# Patient Record
Sex: Female | Born: 1945 | Race: White | Hispanic: Yes | Marital: Married | Smoking: Never smoker
Health system: Southern US, Community
[De-identification: ages and names within clinical notes are randomized; demographics above are authoritative.]

## PROBLEM LIST (undated history)

## (undated) DIAGNOSIS — N289 Disorder of kidney and ureter, unspecified: Secondary | ICD-10-CM

## (undated) DIAGNOSIS — E7800 Pure hypercholesterolemia, unspecified: Secondary | ICD-10-CM

## (undated) DIAGNOSIS — I10 Essential (primary) hypertension: Secondary | ICD-10-CM

## (undated) DIAGNOSIS — E119 Type 2 diabetes mellitus without complications: Secondary | ICD-10-CM

## (undated) HISTORY — PX: TUBAL LIGATION: SHX77

---

## 2003-10-30 ENCOUNTER — Other Ambulatory Visit: Payer: Self-pay

## 2006-12-09 ENCOUNTER — Other Ambulatory Visit: Payer: Self-pay

## 2006-12-09 ENCOUNTER — Emergency Department: Payer: Self-pay | Admitting: Unknown Physician Specialty

## 2006-12-10 ENCOUNTER — Ambulatory Visit: Payer: Self-pay | Admitting: Unknown Physician Specialty

## 2006-12-23 ENCOUNTER — Ambulatory Visit: Payer: Self-pay | Admitting: Family Medicine

## 2009-12-13 ENCOUNTER — Emergency Department (HOSPITAL_COMMUNITY): Admission: EM | Admit: 2009-12-13 | Discharge: 2009-12-13 | Payer: Self-pay | Admitting: Emergency Medicine

## 2011-01-02 ENCOUNTER — Emergency Department (HOSPITAL_COMMUNITY)
Admission: EM | Admit: 2011-01-02 | Discharge: 2011-01-03 | Disposition: A | Discharge status: Home / Self Care | Payer: Self-pay | Attending: Emergency Medicine | Admitting: Emergency Medicine

## 2011-01-02 DIAGNOSIS — R112 Nausea with vomiting, unspecified: Secondary | ICD-10-CM | POA: Insufficient documentation

## 2011-01-02 DIAGNOSIS — I10 Essential (primary) hypertension: Secondary | ICD-10-CM | POA: Insufficient documentation

## 2011-01-02 DIAGNOSIS — E1169 Type 2 diabetes mellitus with other specified complication: Secondary | ICD-10-CM | POA: Insufficient documentation

## 2011-01-02 LAB — CBC
Hemoglobin: 14.6 g/dL (ref 12.0–15.0)
MCHC: 36.7 g/dL — ABNORMAL HIGH (ref 30.0–36.0)
MCV: 83.8 fL (ref 78.0–100.0)
RBC: 4.75 MIL/uL (ref 3.87–5.11)
RDW: 12.2 % (ref 11.5–15.5)

## 2011-01-02 LAB — BASIC METABOLIC PANEL
CO2: 25 mEq/L (ref 19–32)
Calcium: 9.3 mg/dL (ref 8.4–10.5)
Glucose, Bld: 356 mg/dL — ABNORMAL HIGH (ref 70–99)
Potassium: 2.8 mEq/L — ABNORMAL LOW (ref 3.5–5.1)

## 2011-01-02 LAB — DIFFERENTIAL
Basophils Absolute: 0.1 10*3/uL (ref 0.0–0.1)
Basophils Relative: 1 % (ref 0–1)
Eosinophils Relative: 3 % (ref 0–5)
Lymphocytes Relative: 46 % (ref 12–46)
Neutro Abs: 2.6 10*3/uL (ref 1.7–7.7)
Neutrophils Relative %: 42 % — ABNORMAL LOW (ref 43–77)

## 2011-01-03 LAB — GLUCOSE, CAPILLARY: Glucose-Capillary: 307 mg/dL — ABNORMAL HIGH (ref 70–99)

## 2011-01-03 LAB — URINALYSIS, ROUTINE W REFLEX MICROSCOPIC
Glucose, UA: >1000 mg/dL — AB
Ketones, ur: 15 mg/dL — AB
Nitrite: NEGATIVE
Specific Gravity, Urine: 1.034 — ABNORMAL HIGH (ref 1.005–1.030)

## 2011-01-03 LAB — POCT I-STAT, CHEM 8
Glucose, Bld: 314 mg/dL — ABNORMAL HIGH (ref 70–99)
HCT: 39 % (ref 36.0–46.0)
Hemoglobin: 13.3 g/dL (ref 12.0–15.0)
TCO2: 23 mmol/L (ref 0–100)

## 2011-01-03 LAB — URINE MICROSCOPIC-ADD ON

## 2011-01-19 ENCOUNTER — Emergency Department (HOSPITAL_COMMUNITY): Payer: Self-pay

## 2011-01-19 ENCOUNTER — Encounter (HOSPITAL_COMMUNITY): Payer: Self-pay | Admitting: Radiology

## 2011-01-19 ENCOUNTER — Emergency Department (HOSPITAL_COMMUNITY)
Admission: EM | Admit: 2011-01-19 | Discharge: 2011-01-19 | Disposition: A | Discharge status: Home / Self Care | Payer: Self-pay | Attending: Emergency Medicine | Admitting: Emergency Medicine

## 2011-01-19 DIAGNOSIS — R112 Nausea with vomiting, unspecified: Secondary | ICD-10-CM | POA: Insufficient documentation

## 2011-01-19 DIAGNOSIS — F3289 Other specified depressive episodes: Secondary | ICD-10-CM | POA: Insufficient documentation

## 2011-01-19 DIAGNOSIS — E119 Type 2 diabetes mellitus without complications: Secondary | ICD-10-CM | POA: Insufficient documentation

## 2011-01-19 DIAGNOSIS — J329 Chronic sinusitis, unspecified: Secondary | ICD-10-CM | POA: Insufficient documentation

## 2011-01-19 DIAGNOSIS — R0602 Shortness of breath: Secondary | ICD-10-CM | POA: Insufficient documentation

## 2011-01-19 DIAGNOSIS — F329 Major depressive disorder, single episode, unspecified: Secondary | ICD-10-CM | POA: Insufficient documentation

## 2011-01-19 DIAGNOSIS — Z79899 Other long term (current) drug therapy: Secondary | ICD-10-CM | POA: Insufficient documentation

## 2011-01-19 DIAGNOSIS — R079 Chest pain, unspecified: Secondary | ICD-10-CM | POA: Insufficient documentation

## 2011-01-19 DIAGNOSIS — R42 Dizziness and giddiness: Secondary | ICD-10-CM | POA: Insufficient documentation

## 2011-01-19 DIAGNOSIS — R51 Headache: Secondary | ICD-10-CM | POA: Insufficient documentation

## 2011-01-19 DIAGNOSIS — I10 Essential (primary) hypertension: Secondary | ICD-10-CM | POA: Insufficient documentation

## 2011-01-19 HISTORY — DX: Essential (primary) hypertension: I10

## 2011-01-19 LAB — DIFFERENTIAL
Basophils Relative: 1 % (ref 0–1)
Eosinophils Absolute: 0.1 10*3/uL (ref 0.0–0.7)
Lymphocytes Relative: 47 % — ABNORMAL HIGH (ref 12–46)
Lymphs Abs: 2.8 10*3/uL (ref 0.7–4.0)
Monocytes Absolute: 0.4 10*3/uL (ref 0.1–1.0)
Neutro Abs: 2.7 10*3/uL (ref 1.7–7.7)
Neutrophils Relative %: 44 % (ref 43–77)

## 2011-01-19 LAB — URINALYSIS, ROUTINE W REFLEX MICROSCOPIC
Bilirubin Urine: NEGATIVE
Glucose, UA: >1000 mg/dL — AB
Hgb urine dipstick: NEGATIVE
Protein, ur: NEGATIVE mg/dL
Specific Gravity, Urine: 1.028 (ref 1.005–1.030)
Urobilinogen, UA: 0.2 mg/dL (ref 0.0–1.0)

## 2011-01-19 LAB — CK TOTAL AND CKMB (NOT AT ARMC)
Relative Index: INVALID (ref 0.0–2.5)
Total CK: 60 U/L (ref 7–177)
Total CK: 50 U/L (ref 7–177)

## 2011-01-19 LAB — CBC
HCT: 39 % (ref 36.0–46.0)
Hemoglobin: 14 g/dL (ref 12.0–15.0)
MCH: 29.9 pg (ref 26.0–34.0)
Platelets: 306 10*3/uL (ref 150–400)
RBC: 4.69 MIL/uL (ref 3.87–5.11)
WBC: 6.1 10*3/uL (ref 4.0–10.5)

## 2011-01-19 LAB — POCT I-STAT, CHEM 8
Creatinine, Ser: 0.3 mg/dL — ABNORMAL LOW (ref 0.50–1.10)
Glucose, Bld: 337 mg/dL — ABNORMAL HIGH (ref 70–99)
HCT: 42 % (ref 36.0–46.0)

## 2011-01-20 LAB — URINE CULTURE: Colony Count: 5000

## 2013-06-30 ENCOUNTER — Ambulatory Visit: Payer: Self-pay | Admitting: Family Medicine

## 2013-06-30 VITALS — BP 120/80 | HR 99 | Temp 98.0°F | Resp 16 | Ht <= 58 in | Wt 97.0 lb

## 2013-06-30 DIAGNOSIS — E119 Type 2 diabetes mellitus without complications: Secondary | ICD-10-CM | POA: Insufficient documentation

## 2013-06-30 DIAGNOSIS — R42 Dizziness and giddiness: Secondary | ICD-10-CM

## 2013-06-30 DIAGNOSIS — M542 Cervicalgia: Secondary | ICD-10-CM

## 2013-06-30 DIAGNOSIS — IMO0001 Reserved for inherently not codable concepts without codable children: Secondary | ICD-10-CM

## 2013-06-30 LAB — POCT GLYCOSYLATED HEMOGLOBIN (HGB A1C): Hemoglobin A1C: 11

## 2013-06-30 MED ORDER — GLIMEPIRIDE 2 MG PO TABS: 2.0000 mg | ORAL_TABLET | Freq: Every day | ORAL | Status: DC

## 2013-06-30 MED ORDER — MECLIZINE HCL 12.5 MG PO TABS: ORAL_TABLET | ORAL | Status: DC

## 2013-06-30 NOTE — Progress Notes (Signed)
Subjective: 67 year old lady who apparently fell about a month ago. She saw a doctor at some clinic on [address] She had strained her neck. Since then she has had a good deal or dizziness at times. She also has crepitance in her neck. She was found to be diabetic and was placed on metformin. She says it causes her nausea and vomiting. She does not speak any [place].  Fab helped interpret. No other major complaints. She apparently has been told somewhere along the line she also had hyperlipidemia.  Objective: Pleasant elderly Hispanic lady in no acute distress. Eyes PERRLA. EOMs intact. TMs normal. Throat clear. Neck which she says she cannot move she is constantly moving. No crepitance could be appreciated. Good range of motion. Chest clear. Heart regular without murmurs. Extremities normal.  Assessment: Dizziness, mild and mostly positional Diabetes History of hyperlipidemia Cervical pain secondary to fall  Plan: CBC and hemoglobin A1c  Results for orders placed in visit on 06/30/13  GLUCOSE, POCT (MANUAL RESULT ENTRY)      Result Value Range   POC Glucose 292 (*) 70 - 99 mg/dl  POCT GLYCOSYLATED HEMOGLOBIN (HGB A1C)      Result Value Range   Hemoglobin A1C 11.0     Dizziness probably related to the poorly controlled diabetes.  Continue the metformin  Add 2 mg of glyburide daily  Return in 3 weeks

## 2013-06-30 NOTE — Patient Instructions (Signed)
Diabetes Mellitus Tipo 2 - Adultos   (Type 2 Diabetes Mellitus, Adult)  La diabetes mellitus tipo 2, generalmente se denomina simplemente diabetes tipo 2, es una enfermedad de larga duración (crónica). En la diabetes tipo 2, el páncreas no produce suficiente insulina (una hormona), las células son menos sensibles a la insulina que se produce (resistencia a la insulina), o ambos. Normalmente, la insulina mueve los azúcares de los alimentos a las células de los tejidos. Las células de los tejidos utilizan los azúcares para obtener energía. La falta de insulina o la falta de una respuesta normal a la insulina hace que el exceso de azúcar se acumule en la sangre en lugar de penetrar en las células de los tejidos. Como resultado, se desarrollan niveles altos de azúcar en la sangre (hiperglucemia). El efecto de los niveles altos de azúcar (glucosa) puede causar muchas complicaciones.    La diabetes tipo 2 también se denomina diabetes del adulto, pero puede ocurrir a cualquier edad.      FACTORES DE RIESGO   Una persona está predispuesta a desarrollar diabetes tipo 2 si alguien en su familia tiene la enfermedad y también tiene uno o más de los siguientes factores de riesgo principales:   · Sobrepeso.  · Un estilo de vida inactivo.  · Una historia de consumo constante de alimentos de altas calorías.  Mantener un peso saludable y realizar actividad física regular puede reducir la probabilidad de desarrollar diabetes tipo 2.   SÍNTOMAS   Una persona con diabetes tipo 2 no presenta síntomas en un principio. Los síntomas de la diabetes tipo 2 aparecen lentamente. Los síntomas son:   · Aumento de la sed (polidipsia).  · Aumento de ganas de orinar (poliuria).  · Aumento de ganas de orinar durante la noche (nocturia).  · Pérdida de peso. La pérdida de peso puede ser muy rápida.  · Infecciones frecuentes y recurrentes.  · Cansancio (fatiga).  · Debilidad.  · Cambios en la visión, como visión borrosa.  · Olor a fruta en el  aliento.  · Dolor abdominal.  · Náuseas o vómitos.  · Cortes o moretones que tardan en curarse.  · Hormigueo o adormecimiento de las manos y los pies.  DIAGNÓSTICO   No se diagnostica con frecuencia la diabetes tipo 2 hasta que se presentan las complicaciones de la diabetes. La diabetes tipo 2 se diagnostica cuando se presentan los síntomas o las complicaciones y cuando se incrementan los niveles de glucosa en sangre. El nivel de glucosa en la sangre puede controlarse en uno o más de los siguientes análisis de sangre:   · Medición de glucosa de sangre en ayunas. No deberá comer durante al menos 8 horas antes de que se tome la muestra de sangre.  · Pruebas al azar de glucosa en sangre. El nivel de glucosa en sangre se controla en cualquier momento del día sin importar el momento en que haya comido.  · Pruebas de glucosa de sangre de hemoglobina A1c. Una prueba de la hemoglobina A1c proporciona información sobre el control de la glucosa en la sangre durante los últimos 3 meses.  · Prueba oral de tolerancia a la glucosa (SOG). La glucosa en la sangre se mide después de no haber comido (ayunas) durante 2 horas y después de beber una bebida que contenga glucosa.  TRATAMIENTO   · Usted puede necesitar administrarse insulina o medicamentos para la diabetes todos los días para mantener los niveles de glucosa en sangre en el rango deseado.  · Tendrá que regular   la dosis de insulina con el ejercicio y con la elección de alimentos saludables.  El objetivo del tratamiento es mantener el nivel de azúcar en sangre previo a comer (glucosa antes de las comidas) a 70-130 mg/dl.   INSTRUCCIONES PARA EL CUIDADO EN EL HOGAR   · Haga que su nivel de hemoglobina A1c sea verificado dos veces al año.  · Realice el monitoreo diario de glucosa en sangre según las indicaciones de su médico.  · Controle las cetonas en la orina cuando esté enfermo y según las indicaciones de su médico.  · Tome el medicamento para la diabetes o adminístrese  insulina según las indicaciones de su médico para mantener el nivel de glucosa en sangre en el rango deseado.  · Nunca se quede sin medicamentos para la diabetes o sin insulina. Es necesario recibirla todos los días.  · Ajuste la insulina sobre la base de la ingesta de hidratos de carbono. Los hidratos de carbono pueden aumentar los niveles de glucosa en la sangre, pero deben incluirse en su dieta. Los hidratos de carbono aportan vitaminas, minerales y fibra que son una parte esencial de una dieta saludable. Los hidratos de carbono se encuentran en frutas, verduras, granos enteros, productos lácteos, legumbres y alimentos que contienen azúcares añadidos.  ·   · Consuma alimentos saludables. Alterne 3 comidas con 3 colaciones.  · Baje de peso, si tiene sobrepeso.  · Lleve una tarjeta de alerta médica o use una pulsera o medalla de alerta médica.  · Lleve con usted una colación de 15 gramos de hidrato de carbono en todo momento para controlar los niveles bajos de glucosa en sangre (hipoglucemia). Algunos ejemplos de colaciones de 15 gramos de hidratos de carbono son:  · Tabletas de glucosa, 3 o 4.  ·   Gel de glucosa, tubo de 15 gramos.  · Pasas, 2 cucharadas (24 gramos).  · Caramelos de goma, 6.  · Galletas de animales, 8.  · Gaseosa, 4 onzas (120 mililitros).  · Pastillas de goma, 9.  · Reconocer la hipoglucemia. La hipoglucemia se produce cuando los niveles de glucosa en sangre son de 70 mg/dl o menos. El riesgo de hipoglucemia aumenta durante el ayuno o al saltearse las comidas, durante o después del ejercicio intenso, y durante el sueño. Los síntomas de hipoglucemia son:  · Temblores o sacudidas.  · Disminución de capacidad de concentración.  · Sudoración.  · Aumento en el ritmo cardíaco.  · Dolor de cabeza.  · Boca seca.  · Hambre.  · Irritabilidad.  · Ansiedad.  · Sueño agitado.  · Alteración del habla o de la coordinación.  · Confusión.  · Tratar la hipoglucemia rápidamente. Si usted está alerta y puede tragar  con seguridad, siga la regla de 15:15:   · Tome de 15 a 20 gramos de glucosa de acción rápida o hidratos de carbono. Las opciones de acción rápida son un gel de glucosa, unas tabletas de glucosa, o 4 onzas (120 ml) de jugo de frutas, soda regular, o leche baja en grasa.  · Compruebe su nivel de glucosa en sangre 15 minutos después de tomar la glucosa.  · Tome de 15 a 20 gramos más de glucosa si al repetir el nivel de glucosa en la sangre todavía es de 70 mg / dl o inferior.  · Consuma una comida o una colación dentro de 1 hora una vez que los niveles de glucosa en la sangre vuelven a la normalidad.  ·   · Esté alerta   o como lo indique su mdico.  Ajuste su medicamento y la ingesta de alimentos, segn sea necesario, si inicia un nuevo ejercicio o deporte.  Siga su plan diario de enfermo en algn momento que no puede comer o beber como de costumbre.  Evite el consumo de tabaco.  Limite el consumo de alcohol a no ms de 1 medida por da en las mujeres no embarazadas y 2 medidas en los hombres. Debe beber alcohol slo mientras come. Hable con su mdico acerca de si el alcohol es seguro para usted. Informe a su mdico si bebe alcohol varias veces a la semana.  Concurra regularmente a las visitas de control con el mdico.  Programe un examen de la vista poco despus del diagnstico de diabetes tipo 2 y luego anualmente.  Realice diariamente el cuidado de la piel y de los pies. Examine su piel y los pies diariamente para cortes, moretones, enrojecimiento, problemas en las uas,  sangrado, ampollas o llagas. Anualmente debe concurrir a Location manager de los pies con un especialista.  Cepllese los dientes y encas por lo menos dos veces al da y use hilo dental al menos una vez por da. Concurra regularmente a las visitas de control con el dentista.  Comparta su plan de control de diabetes en el trabajo o en la escuela.  Mantngase al da con las vacunas.  Aprenda a Dealer.  Obtenga la educacin diabtica continuada y [place] cuando sea necesario.  Participe o busque rehabilitacin segn sea necesario para mantener o mejorar la [place] y la calidad de vida. Solicite la derivacin a terapia fsica u ocupacional si tiene adormecido el pie o la mano o tiene problemas para asearse, vestirse, comer, o durante la [place] fsica. SOLICITE ATENCIN MDICA SI:   No puede comer alimentos o beber por ms de 6 horas.  Tuvo nuseas o ha vomitado durante ms de 6 horas.  Su nivel de glucosa en sangre es mayor de 240 mg/dl.  Hay algn cambio en su estado mental.  Desarrolla una enfermedad adicional grave.  Tiene diarrea durante ms de 6 horas.  Ha estado enfermo o ha tenido fiebre durante un par [address] y no mejora.  Siente dolor al practicar cualquier actividad fsica.  SOLICITE ATENCIN MDICA DE INMEDIATO SI:   Tiene dificultad para respirar.  Tiene niveles de cetonas moderados a altos. ASEGRESE DE QUE:   Comprende estas instrucciones.  Controlar su enfermedad.  Solicitar ayuda de inmediato si no mejora o si empeora. Document Released: 06/25/2005 Document Revised: 03/19/2012 [facility] Patient Information 2014 [place], [place].   Go online on internet and check American Diabetes Association patient website.  I think it has diabetes instructions in Spanish also.

## 2013-12-17 ENCOUNTER — Other Ambulatory Visit: Payer: Self-pay | Admitting: Family Medicine

## 2014-03-03 ENCOUNTER — Telehealth: Payer: Self-pay

## 2014-03-03 MED ORDER — GLIMEPIRIDE 2 MG PO TABS: 2.0000 mg | ORAL_TABLET | Freq: Every day | ORAL | Status: DC

## 2014-03-03 NOTE — Telephone Encounter (Signed)
Pharm faxed req for RF of glimepiride. Pt is overdue for f/up. I will send in 1 more 2 week RF w/note.

## 2014-07-01 ENCOUNTER — Telehealth: Payer: Self-pay | Admitting: Urgent Care

## 2014-07-01 ENCOUNTER — Ambulatory Visit (INDEPENDENT_AMBULATORY_CARE_PROVIDER_SITE_OTHER): Payer: Self-pay | Admitting: Physician Assistant

## 2014-07-01 VITALS — BP 143/78 | HR 112 | Temp 98.2°F | Resp 16 | Ht <= 58 in | Wt 94.8 lb

## 2014-07-01 DIAGNOSIS — E1165 Type 2 diabetes mellitus with hyperglycemia: Secondary | ICD-10-CM

## 2014-07-01 DIAGNOSIS — R112 Nausea with vomiting, unspecified: Secondary | ICD-10-CM

## 2014-07-01 DIAGNOSIS — R8290 Unspecified abnormal findings in urine: Secondary | ICD-10-CM

## 2014-07-01 DIAGNOSIS — N390 Urinary tract infection, site not specified: Secondary | ICD-10-CM

## 2014-07-01 DIAGNOSIS — E119 Type 2 diabetes mellitus without complications: Secondary | ICD-10-CM

## 2014-07-01 DIAGNOSIS — R319 Hematuria, unspecified: Secondary | ICD-10-CM

## 2014-07-01 DIAGNOSIS — IMO0002 Reserved for concepts with insufficient information to code with codable children: Secondary | ICD-10-CM

## 2014-07-01 DIAGNOSIS — R42 Dizziness and giddiness: Secondary | ICD-10-CM

## 2014-07-01 LAB — POCT URINALYSIS DIPSTICK
BILIRUBIN UA: NEGATIVE
GLUCOSE UA: 500
Ketones, UA: 15
Leukocytes, UA: NEGATIVE
Nitrite, UA: POSITIVE
Protein, UA: 100
SPEC GRAV UA: 1.02
UROBILINOGEN UA: 0.2
pH, UA: 5.5

## 2014-07-01 LAB — POCT CBC
Granulocyte percent: 75.2 %G (ref 37–80)
HEMATOCRIT: 43.7 % (ref 37.7–47.9)
Hemoglobin: 14.5 g/dL (ref 12.2–16.2)
LYMPH, POC: 1.3 (ref 0.6–3.4)
MCH, POC: 29.9 pg (ref 27–31.2)
MCHC: 33.3 g/dL (ref 31.8–35.4)
MCV: 89.8 fL (ref 80–97)
MID (cbc): 0.4 (ref 0–0.9)
MPV: 7.5 fL (ref 0–99.8)
POC Granulocyte: 5.3 (ref 2–6.9)
POC LYMPH PERCENT: 19 %L (ref 10–50)
POC MID %: 5.8 %M (ref 0–12)
Platelet Count, POC: 339 10*3/uL (ref 142–424)
RBC: 4.87 M/uL (ref 4.04–5.48)
RDW, POC: 13 %
WBC: 7 10*3/uL (ref 4.6–10.2)

## 2014-07-01 LAB — POCT UA - MICROSCOPIC ONLY
CRYSTALS, UR, HPF, POC: NEGATIVE
Casts, Ur, LPF, POC: NEGATIVE
Mucus, UA: NEGATIVE
Yeast, UA: NEGATIVE

## 2014-07-01 LAB — COMPREHENSIVE METABOLIC PANEL
ALBUMIN: 4.2 g/dL (ref 3.5–5.2)
ALK PHOS: 79 U/L (ref 39–117)
ALT: 11 U/L (ref 0–35)
AST: 13 U/L (ref 0–37)
BUN: 16 mg/dL (ref 6–23)
CALCIUM: 9.3 mg/dL (ref 8.4–10.5)
CHLORIDE: 99 meq/L (ref 96–112)
CO2: 25 mEq/L (ref 19–32)
Creat: 0.54 mg/dL (ref 0.50–1.10)
Glucose, Bld: 269 mg/dL — ABNORMAL HIGH (ref 70–99)
POTASSIUM: 4.2 meq/L (ref 3.5–5.3)
SODIUM: 135 meq/L (ref 135–145)
TOTAL PROTEIN: 7 g/dL (ref 6.0–8.3)
Total Bilirubin: 1.5 mg/dL — ABNORMAL HIGH (ref 0.2–1.2)

## 2014-07-01 LAB — POCT GLYCOSYLATED HEMOGLOBIN (HGB A1C): HEMOGLOBIN A1C: 12.7

## 2014-07-01 LAB — GLUCOSE, POCT (MANUAL RESULT ENTRY): POC Glucose: 273 mg/dl — AB (ref 70–99)

## 2014-07-01 MED ORDER — SULFAMETHOXAZOLE-TRIMETHOPRIM 800-160 MG PO TABS: 1.0000 | ORAL_TABLET | Freq: Two times a day (BID) | ORAL | Status: DC

## 2014-07-01 MED ORDER — METFORMIN HCL 500 MG PO TABS: 500.0000 mg | ORAL_TABLET | Freq: Two times a day (BID) | ORAL | Status: DC

## 2014-07-01 MED ORDER — MECLIZINE HCL 12.5 MG PO TABS: ORAL_TABLET | ORAL | Status: DC

## 2014-07-01 NOTE — Progress Notes (Signed)
MRN: [id number] DOB: [birth year]  Subjective:   Emily Hoover is a 67 y.o. female with pmh of Type 2 Diabetes presenting for 1 day history of nausea, vomiting and dizziness. Reports that her vomiting is yellow-green but without blood. Has not vomited today but has persistent nausea. She has been able to hydrate and eat small meals. Also admits dizziness, not new onset, previously seen here ~1 year ago for same complaint and resolved with Meclizine. However, she ran out of Meclizine and has not been able to take it, cannot afford it. She denies any falls, syncope, head trauma, confusion, heart racing, palpitations, chest pain, shob, cough, wheezing, abdominal pain, changes in sensation, dysuria, hematuria, diarrhea. Of note, patient was diagnosed with diabetes 2007, she has not been taking medications or obtained medical care for the past year d/t lack of financial resources. Also, patient admits that she feels depressed, has no support network despite multiple adult children who live in the area. She lives alone and feels very lonely, dependent on others since she is unable to drive. Denies any other aggravating or relieving factors, no other questions or concerns.  Mrs. Emily Hoover is not currently taking any medications.  She has No Known Allergies.  Emily Hoover  has a past medical history of Diabetes mellitus and Hypertension. Also  has no past surgical history on file.  ROS As in subjective.  Objective:   Vitals: BP 143/78 mmHg  Pulse 112  Temp(Src) 98.2 F (36.8 C) (Oral)  Resp 16  Ht 4\' 9"  (1.448 m)  Wt 94 lb 12.8 oz (43.001 kg)  BMI 20.51 kg/m2  SpO2 97%  Physical Exam  Constitutional: She is oriented to person, place, and time.  Appears frail.  HENT:  TM's cerumen occluded but otherwise no effusions or erythema. Nasal turbinates pink and moist. Dentition in poor condition, enamel erosion, multiple missing teeth, no oropharyngeal exudates.  Eyes: Conjunctivae and EOM are normal. Right  eye exhibits no discharge. Left eye exhibits no discharge. No scleral icterus.  Cardiovascular: Regular rhythm, normal heart sounds and intact distal pulses.  Tachycardia present.  Exam reveals no gallop and no friction rub.   No murmur heard. Pulmonary/Chest: Effort normal and breath sounds normal. No respiratory distress. She has no wheezes. She has no rales. She exhibits no tenderness.  Abdominal: Soft. Bowel sounds are normal. She exhibits no distension and no mass. There is tenderness (mild lower abdominal). There is no rebound and no guarding.  Neurological: She is alert and oriented to person, place, and time.  Skin: Skin is warm and dry. No rash noted. She is not diaphoretic. No erythema.  Psychiatric: Mood and affect normal.   Results for orders placed or performed in visit on 07/01/14 (from the past 24 hour(s))  POCT CBC     Status: None   Collection Time: 07/01/14 12:57 PM  Result Value Ref Range   WBC 7.0 4.6 - 10.2 K/uL   Lymph, poc 1.3 0.6 - 3.4   POC LYMPH PERCENT 19.0 10 - 50 %L   MID (cbc) 0.4 0 - 0.9   POC MID % 5.8 0 - 12 %M   POC Granulocyte 5.3 2 - 6.9   Granulocyte percent 75.2 37 - 80 %G   RBC 4.87 4.04 - 5.48 M/uL   Hemoglobin 14.5 12.2 - 16.2 g/dL   HCT, POC 43.7 37.7 - 47.9 %   MCV 89.8 80 - 97 fL   MCH, POC 29.9 27 - 31.2 pg   MCHC  33.3 31.8 - 35.4 g/dL   RDW, POC 13.0 %   Platelet Count, POC 339 142 - 424 K/uL   MPV 7.5 0 - 99.8 fL  POCT glycosylated hemoglobin (Hb A1C)     Status: None   Collection Time: 07/01/14 12:58 PM  Result Value Ref Range   Hemoglobin A1C 12.7   POCT glucose (manual entry)     Status: Abnormal   Collection Time: 07/01/14 12:58 PM  Result Value Ref Range   POC Glucose 273 (A) 70 - 99 mg/dl  POCT urinalysis dipstick     Status: None   Collection Time: 07/01/14  1:19 PM  Result Value Ref Range   Color, UA yellow    Clarity, UA cloudy    Glucose, UA 500    Bilirubin, UA neg    Ketones, UA 15    Spec Grav, UA 1.020     Blood, UA trace-lysed    pH, UA 5.5    Protein, UA 100    Urobilinogen, UA 0.2    Nitrite, UA positive    Leukocytes, UA Negative   POCT UA - Microscopic Only     Status: None   Collection Time: 07/01/14  1:41 PM  Result Value Ref Range   WBC, Ur, HPF, POC 0-1    RBC, urine, microscopic 3-12    Bacteria, U Microscopic 4+    Mucus, UA neg    Epithelial cells, urine per micros 0-2    Crystals, Ur, HPF, POC neg    Casts, Ur, LPF, POC neg    Yeast, UA neg    Assessment and Plan :   1. Type 2 diabetes mellitus without complication - Not controlled due to lack of resources, support, education. Cmet pending, will make recommendations to start metformin, acei or arb thereafter. - POCT CBC - POCT glycosylated hemoglobin (Hb A1C) - POCT glucose (manual entry) - POCT urinalysis dipstick - Comprehensive metabolic panel  2. Non-intractable vomiting with nausea, vomiting of unspecified type 3. Abnormal urinalysis 4. Urinary tract infection with hematuria, site unspecified - UA demonstrates UTI, urine culture pending, n/v likely related to UTI - start Septra x 10 days, advised patient of worsening signs and symptoms, report to ED if necessary  5. Dizziness - unclear etiology, may be related to uncontrolled diabetes, uti, will treat with Meclizine and monitor  6. Patient requested results be reported to her phone [phone]  Jaynee Eagles, PA-C [facility] [phone] 07/01/2014 1:51 PM

## 2014-07-01 NOTE — Telephone Encounter (Signed)
Please let patient know that she needs to pick up Rx for Metformin at her pharmacy. She Spanish-speaking only and her phone is [phone]. I will call her to discuss results on Monday when I return to work.  Thank you!  Jaynee Eagles, PA-C [facility] [phone] 07/01/2014  5:14 PM

## 2014-07-01 NOTE — Patient Instructions (Addendum)
Por favor Journalist, newspaper - [phone] para pedir ayuda de un trabajador social.   Bacteriuria asintomtica (Asymptomatic Bacteriuria) La bacteriuria asintomtica es la presencia de un gran nmero de bacterias en la orina, sin los sntomas habituales de ardor o deseos frecuentes de Garment/textile technologist. Los siguientes factores aumentan el riesgo de bacteriuria asintomtica:  Diabetes mellitus.  Edad avanzada.  Embarazo en Nurse, mental health.  Clculos en el rin.  Trasplantes de rin.  Filtracin desde la vejiga hacia el rin por falla valvular en los nios pequeos (reflujo). En la [employer], no se necesita tratamiento para Personnel officer, y Theatre stage manager en otros problemas, como proliferacin de hongos y crecimiento de bacterias resistentes. Sin embargo, en algunos casos, como en las mujeres [place], se requiere tratamiento para prevenir una infeccin renal. La bacteriuria asintomtica en el embarazo tambin se asocia a una restriccin del desarrollo fetal, parto prematuro y [place] del recin nacido. INSTRUCCIONES PARA EL CUIDADO EN EL HOGAR Controle su afeccin para ver si hay cambios. Las siguientes indicaciones pueden ayudar a Writer [employer] System pueda sentir:  Beba gran cantidad de lquido para mantener la orina de tono claro o color amarillo plido. Vaya al bao con ms frecuencia para mantener la vejiga vaca.  Mantenga limpia la zona que rodea la vagina y Designer, television/film set. Higiencese de adelante hacia atrs despus de orinar. SOLICITE ATENCIN MDICA DE INMEDIATO SI:  Tiene signos de infeccin como:  Ardor al Garment/textile technologist.  Miccin frecuente.  Dolor de espalda.  Cristy Hilts.  Observa sangre en la orina.  Tiene fiebre. ASEGRESE DE QUE:  Comprende estas instrucciones.  Controlar su afeccin.  Recibir ayuda de inmediato si no mejora o si empeora. Document Released: 06/25/2005 Document Revised: 11/09/2013 [facility] Patient  Information 2015 [place], [place]. This information is not intended to replace advice given to you by your health care provider. Make sure you discuss any questions you have with your health care provider.

## 2014-07-03 LAB — URINE CULTURE: Colony Count: >=100000

## 2014-07-05 ENCOUNTER — Encounter: Payer: Self-pay | Admitting: Urgent Care

## 2014-07-06 ENCOUNTER — Other Ambulatory Visit: Payer: Self-pay | Admitting: Urgent Care

## 2014-07-06 DIAGNOSIS — E1165 Type 2 diabetes mellitus with hyperglycemia: Secondary | ICD-10-CM

## 2014-07-06 DIAGNOSIS — IMO0002 Reserved for concepts with insufficient information to code with codable children: Secondary | ICD-10-CM

## 2014-07-06 MED ORDER — METFORMIN HCL 500 MG PO TABS: 500.0000 mg | ORAL_TABLET | Freq: Two times a day (BID) | ORAL | Status: DC

## 2014-07-06 NOTE — Telephone Encounter (Signed)
See progress note from 07/06/2014.

## 2014-07-06 NOTE — Progress Notes (Signed)
Spoke with patient by phone. Advised her that I sent rx for metformin to her pharmacy. Requested that she return to clinic 07/07/2014 for urine culture repeat d/t poor specimen collection as reported by lab, minimal symptom improvement in n/v.  Jaynee Eagles, PA-C [facility] [phone] 07/06/2014  5:34 PM

## 2014-07-08 NOTE — Progress Notes (Signed)
The patient was discussed with me and I agree with the diagnosis and treatment plan.  

## 2015-02-13 ENCOUNTER — Other Ambulatory Visit: Payer: Self-pay | Admitting: Urgent Care

## 2015-02-15 ENCOUNTER — Other Ambulatory Visit: Payer: Self-pay | Admitting: Urgent Care

## 2015-04-27 ENCOUNTER — Other Ambulatory Visit: Payer: Self-pay | Admitting: Urgent Care

## 2015-09-06 ENCOUNTER — Encounter (HOSPITAL_COMMUNITY): Payer: Self-pay | Admitting: Neurology

## 2015-09-06 ENCOUNTER — Observation Stay (HOSPITAL_COMMUNITY)
Admission: EM | Admit: 2015-09-06 | Discharge: 2015-09-09 | Disposition: A | Discharge status: Home / Self Care | Payer: Medicaid Other | Attending: Family Medicine | Admitting: Family Medicine

## 2015-09-06 ENCOUNTER — Emergency Department (HOSPITAL_COMMUNITY): Payer: Medicaid Other

## 2015-09-06 DIAGNOSIS — H8309 Labyrinthitis, unspecified ear: Secondary | ICD-10-CM

## 2015-09-06 DIAGNOSIS — E1165 Type 2 diabetes mellitus with hyperglycemia: Secondary | ICD-10-CM | POA: Insufficient documentation

## 2015-09-06 DIAGNOSIS — M542 Cervicalgia: Secondary | ICD-10-CM | POA: Diagnosis not present

## 2015-09-06 DIAGNOSIS — G8929 Other chronic pain: Secondary | ICD-10-CM | POA: Insufficient documentation

## 2015-09-06 DIAGNOSIS — R42 Dizziness and giddiness: Principal | ICD-10-CM

## 2015-09-06 DIAGNOSIS — E7800 Pure hypercholesterolemia, unspecified: Secondary | ICD-10-CM | POA: Insufficient documentation

## 2015-09-06 DIAGNOSIS — I10 Essential (primary) hypertension: Secondary | ICD-10-CM | POA: Diagnosis not present

## 2015-09-06 DIAGNOSIS — Z7982 Long term (current) use of aspirin: Secondary | ICD-10-CM | POA: Insufficient documentation

## 2015-09-06 DIAGNOSIS — Z7984 Long term (current) use of oral hypoglycemic drugs: Secondary | ICD-10-CM | POA: Diagnosis not present

## 2015-09-06 DIAGNOSIS — R1110 Vomiting, unspecified: Secondary | ICD-10-CM

## 2015-09-06 DIAGNOSIS — H8301 Labyrinthitis, right ear: Secondary | ICD-10-CM | POA: Diagnosis not present

## 2015-09-06 DIAGNOSIS — R112 Nausea with vomiting, unspecified: Secondary | ICD-10-CM | POA: Diagnosis not present

## 2015-09-06 DIAGNOSIS — E119 Type 2 diabetes mellitus without complications: Secondary | ICD-10-CM

## 2015-09-06 DIAGNOSIS — I700 Atherosclerosis of aorta: Secondary | ICD-10-CM | POA: Insufficient documentation

## 2015-09-06 HISTORY — DX: Pure hypercholesterolemia, unspecified: E78.00

## 2015-09-06 HISTORY — DX: Type 2 diabetes mellitus without complications: E11.9

## 2015-09-06 LAB — BASIC METABOLIC PANEL
Anion gap: 15 (ref 5–15)
BUN: 15 mg/dL (ref 6–20)
CHLORIDE: 99 mmol/L — AB (ref 101–111)
CO2: 22 mmol/L (ref 22–32)
Calcium: 9.5 mg/dL (ref 8.9–10.3)
Creatinine, Ser: 0.62 mg/dL (ref 0.44–1.00)
GFR calc Af Amer: >60 mL/min (ref >60)
GFR calc non Af Amer: >60 mL/min (ref >60)
GLUCOSE: 399 mg/dL — AB (ref 65–99)
POTASSIUM: 3.7 mmol/L (ref 3.5–5.1)
Sodium: 136 mmol/L (ref 135–145)

## 2015-09-06 LAB — URINE MICROSCOPIC-ADD ON

## 2015-09-06 LAB — URINALYSIS, ROUTINE W REFLEX MICROSCOPIC
BILIRUBIN URINE: NEGATIVE
Hgb urine dipstick: NEGATIVE
KETONES UR: NEGATIVE mg/dL
LEUKOCYTES UA: NEGATIVE
Nitrite: NEGATIVE
PH: 5.5 (ref 5.0–8.0)
Protein, ur: 30 mg/dL — AB
Specific Gravity, Urine: 1.04 — ABNORMAL HIGH (ref 1.005–1.030)

## 2015-09-06 LAB — I-STAT TROPONIN, ED: Troponin i, poc: 0 ng/mL (ref 0.00–0.08)

## 2015-09-06 LAB — CBC
HEMATOCRIT: 37.1 % (ref 36.0–46.0)
Hemoglobin: 12.8 g/dL (ref 12.0–15.0)
MCH: 29.6 pg (ref 26.0–34.0)
MCHC: 34.5 g/dL (ref 30.0–36.0)
MCV: 85.7 fL (ref 78.0–100.0)
Platelets: 301 10*3/uL (ref 150–400)
RBC: 4.33 MIL/uL (ref 3.87–5.11)
RDW: 13 % (ref 11.5–15.5)
WBC: 5.1 10*3/uL (ref 4.0–10.5)

## 2015-09-06 LAB — CBG MONITORING, ED: Glucose-Capillary: 375 mg/dL — ABNORMAL HIGH (ref 65–99)

## 2015-09-06 MED ORDER — MECLIZINE HCL 25 MG PO TABS
25.0000 mg | ORAL_TABLET | Freq: Once | ORAL | Status: AC
  Administered 2015-09-06: 25 mg via ORAL
  Filled 2015-09-06: qty 1

## 2015-09-06 MED ORDER — SODIUM CHLORIDE 0.9 % IV BOLUS (SEPSIS)
1000.0000 mL | Freq: Once | INTRAVENOUS | Status: AC
  Administered 2015-09-06: 1000 mL via INTRAVENOUS

## 2015-09-06 MED ORDER — IOHEXOL 350 MG/ML SOLN
50.0000 mL | Freq: Once | INTRAVENOUS | Status: AC | PRN
  Administered 2015-09-06: 50 mL via INTRAVENOUS

## 2015-09-06 NOTE — ED Notes (Signed)
[id number] 

## 2015-09-06 NOTE — ED Provider Notes (Addendum)
CSN: [id number]     Arrival date & time 09/06/15  1717 History   First MD Initiated Contact with Patient 09/06/15 2011     Chief Complaint  Patient presents with  . Dizziness  . Hypertension     (Consider location/radiation/quality/duration/timing/severity/associated sxs/prior Treatment) Patient is a 70 y.o. female presenting with dizziness and hypertension. The history is provided by the patient.  Dizziness Quality:  Head spinning Severity:  Moderate Onset quality:  Gradual Duration:  1 week Timing:  Constant Progression:  Worsening Chronicity:  New Context: head movement   Context comment:  Extending her neck Relieved by:  Nothing Worsened by:  Nothing Ineffective treatments:  None tried Associated symptoms: no chest pain, no headaches, no nausea, no palpitations, no shortness of breath and no vomiting   Risk factors: no hx of vertigo   Hypertension Pertinent negatives include no chest pain, no headaches and no shortness of breath.   70 yo F With a chief complaint of lightheadedness. This been going on for the past couple days. Patient states that usually worse when he leans her head back. She denies any head injury. She is complaining of bilateral posterior neck pain. Denies any chest pain or shortness of breath. Denies abdominal pain. Standing up quickly also makes her feel lightheaded. She describes the lightheadedness as a sensation of the ribs spinning around her. Denies tinnitus denies hearing loss.  Past Medical History  Diagnosis Date  . Diabetes mellitus without complication ([place])   . Hypertension   . Hypercholesteremia    History reviewed. No pertinent past surgical history. No family history on file. Social History  Substance Use Topics  . Smoking status: Never Smoker   . Smokeless tobacco: None  . Alcohol Use: No   OB History    No data available     Review of Systems  Constitutional: Negative for fever and chills.  HENT: Negative for congestion and  rhinorrhea.   Eyes: Negative for redness and visual disturbance.  Respiratory: Negative for shortness of breath and wheezing.   Cardiovascular: Negative for chest pain and palpitations.  Gastrointestinal: Negative for nausea and vomiting.  Genitourinary: Negative for dysuria and urgency.  Musculoskeletal: Negative for myalgias and arthralgias.  Skin: Negative for pallor and wound.  Neurological: Positive for dizziness. Negative for headaches.      Allergies  Review of patient's allergies indicates no known allergies.  Home Medications   Prior to Admission medications   Not on File   BP 141/77 mmHg  Pulse 77  Temp(Src) 98.7 F (37.1 C) (Oral)  Resp 16  SpO2 93% Physical Exam  Constitutional: She is oriented to person, place, and time. She appears well-developed and well-nourished. No distress.  HENT:  Head: Normocephalic and atraumatic.  Eyes: EOM are normal. Pupils are equal, round, and reactive to light.  Neck: Normal range of motion. Neck supple.  Cardiovascular: Normal rate and regular rhythm.  Exam reveals no gallop and no friction rub.   No murmur heard. Pulmonary/Chest: Effort normal. She has no wheezes. She has no rales.  Abdominal: Soft. She exhibits no distension. There is no tenderness.  Musculoskeletal: She exhibits no edema or tenderness.  Neurological: She is alert and oriented to person, place, and time. She has normal strength. No cranial nerve deficit or sensory deficit. Coordination and gait normal. GCS eye subscore is 4. GCS verbal subscore is 5. GCS motor subscore is 6. She displays no Babinski's sign on the right side.  Reflex Scores:  Tricep reflexes are 2+ on the right side and 2+ on the left side.      Bicep reflexes are 2+ on the right side and 2+ on the left side.      Brachioradialis reflexes are 2+ on the right side and 2+ on the left side.      Patellar reflexes are 2+ on the right side and 2+ on the left side.      Achilles reflexes are 2+  on the right side and 2+ on the left side. Benign neuro exam  Skin: Skin is warm and dry. She is not diaphoretic.  Psychiatric: She has a normal mood and affect. Her behavior is normal.  Nursing note and vitals reviewed.   ED Course  Procedures (including critical care time) Labs Review Labs Reviewed  BASIC METABOLIC PANEL - Abnormal; Notable for the following:    Chloride 99 (*)    Glucose, Bld 399 (*)    All other components within normal limits  URINALYSIS, ROUTINE W REFLEX MICROSCOPIC (NOT AT [facility]) - Abnormal; Notable for the following:    Specific Gravity, Urine 1.040 (*)    Glucose, UA >1000 (*)    Protein, ur 30 (*)    All other components within normal limits  URINE MICROSCOPIC-ADD ON - Abnormal; Notable for the following:    Squamous Epithelial / LPF 0-5 (*)    Bacteria, UA RARE (*)    Casts HYALINE CASTS (*)    All other components within normal limits  CBG MONITORING, ED - Abnormal; Notable for the following:    Glucose-Capillary 375 (*)    All other components within normal limits  CBC  I-STAT TROPOININ, ED    Imaging Review Ct Angio Neck W/cm &/or Wo/cm  09/07/2015  CLINICAL DATA:  Posterior neck pain with extension and dizziness. Assess for vertebral artery stenosis or dissection. History of hypertension, hypercholesterolemia and diabetes. EXAM: CT ANGIOGRAPHY NECK TECHNIQUE: Multidetector CT imaging of the neck was performed using the standard protocol during bolus administration of intravenous contrast. Multiplanar CT image reconstructions and MIPs were obtained to evaluate the vascular anatomy. Carotid stenosis measurements (when applicable) are obtained utilizing NASCET criteria, using the distal internal carotid diameter as the denominator. CONTRAST:  74mL OMNIPAQUE IOHEXOL 350 MG/ML SOLN COMPARISON:  None. FINDINGS: Aortic arch: Normal appearance of the thoracic arch, normal branch pattern. Mild calcific atherosclerosis of the aortic arch. The origins of the  innominate, left Common carotid artery and subclavian artery are widely patent. Right carotid system: Common carotid artery is widely patent, coursing in a straight line fashion. Mild anterior intimal thickening in RIGHT Common carotid artery. Normal appearance of the carotid bifurcation without hemodynamically significant stenosis by NASCET criteria. Normal appearance of the included internal carotid artery. Left carotid system: Common carotid artery is widely patent, coursing in a straight line fashion. Normal appearance of the carotid bifurcation without hemodynamically significant stenosis by NASCET criteria. Normal appearance of the included internal carotid artery. Vertebral arteries:Left vertebral artery is dominant. LEFT V1 and old without dissection. Mild luminal regularity of the RIGHT vertebral artery throughout the V1, V2 and V3 segments. No dissection flap, significant intimal hematoma or pseudo aneurysm. Skeleton: No acute osseous process though bone windows have not been submitted. Grade 1 C3-4 anterolisthesis with mild LEFT facet widening and periarticular sclerosis. Other neck: Soft tissues of the neck are nonacute though, not tailored for evaluation. Small RIGHT hilar calcified lymph nodes. IMPRESSION: Luminal irregularity RIGHT vertebral artery concerning for minimal intimal injury (BC  VI grade 1) without discrete dissection flap or flow limiting stenosis. Mild probable inflammatory changes of LEFT C3-4 facet with grade 1 C3-4 anterolisthesis. Acute findings discussed with and reconfirmed by Dr.Woodson Macha on 09/07/2015 at 12:43 am. Electronically Signed   By: Elon Alas M.D.   On: 09/07/2015 00:43   I have personally reviewed and evaluated these images and lab results as part of my medical decision-making.   EKG Interpretation   Date/Time:  Tuesday September 06 2015 17:45:50 EST Ventricular Rate:  104 PR Interval:  140 QRS Duration: 64 QT Interval:  342 QTC Calculation: 449 R Axis:    51 Text Interpretation:  Sinus tachycardia Nonspecific ST and T wave  abnormality Abnormal ECG No old tracing to compare Confirmed by Aundrea Horace MD,  DANIEL [phone]) on 09/06/2015 8:14:50 PM      MDM   Final diagnoses:  Dizziness  Vertebral artery dissection ([place])    70 yo F with a chief complaints of lightheadedness. Feel this may be presyncopal as it's worse with rapid standing. Some concern is the patient has symptoms when she extends her neck for possible stenosis of the carotid arteries. Will obtain a CT scan of the neck.  CT with vertebral a dissection.  Discussed with Dr. Lavone Neri, neuro, recommend MRI, asa and plavix if negative and follow up with neurologist.         Deno Etienne, DO 09/07/15 [place], DO 09/07/15 [id number]

## 2015-09-06 NOTE — ED Notes (Addendum)
Pt has dizziness since yesterday all the time but is worse when she lays down or stands up. Denies cp or sob. Her neck has been hurting for a "long, long" time. Pt is alert, follows commands. No facial droop, equal grips, no drift. Is diabetic. Pt is ambulatory. CBG 375

## 2015-09-07 ENCOUNTER — Emergency Department (HOSPITAL_COMMUNITY): Payer: Medicaid Other

## 2015-09-07 ENCOUNTER — Encounter (HOSPITAL_COMMUNITY): Payer: Self-pay | Admitting: Family Medicine

## 2015-09-07 DIAGNOSIS — I10 Essential (primary) hypertension: Secondary | ICD-10-CM | POA: Diagnosis not present

## 2015-09-07 DIAGNOSIS — Z7982 Long term (current) use of aspirin: Secondary | ICD-10-CM | POA: Diagnosis not present

## 2015-09-07 DIAGNOSIS — E1165 Type 2 diabetes mellitus with hyperglycemia: Secondary | ICD-10-CM | POA: Diagnosis not present

## 2015-09-07 DIAGNOSIS — H8301 Labyrinthitis, right ear: Secondary | ICD-10-CM

## 2015-09-07 DIAGNOSIS — R1110 Vomiting, unspecified: Secondary | ICD-10-CM | POA: Insufficient documentation

## 2015-09-07 DIAGNOSIS — M542 Cervicalgia: Secondary | ICD-10-CM

## 2015-09-07 DIAGNOSIS — R42 Dizziness and giddiness: Secondary | ICD-10-CM

## 2015-09-07 DIAGNOSIS — E119 Type 2 diabetes mellitus without complications: Secondary | ICD-10-CM

## 2015-09-07 DIAGNOSIS — R112 Nausea with vomiting, unspecified: Secondary | ICD-10-CM | POA: Diagnosis not present

## 2015-09-07 DIAGNOSIS — G8929 Other chronic pain: Secondary | ICD-10-CM | POA: Insufficient documentation

## 2015-09-07 DIAGNOSIS — H8309 Labyrinthitis, unspecified ear: Secondary | ICD-10-CM

## 2015-09-07 DIAGNOSIS — I700 Atherosclerosis of aorta: Secondary | ICD-10-CM | POA: Diagnosis not present

## 2015-09-07 DIAGNOSIS — Z7984 Long term (current) use of oral hypoglycemic drugs: Secondary | ICD-10-CM | POA: Diagnosis not present

## 2015-09-07 DIAGNOSIS — H8303 Labyrinthitis, bilateral: Secondary | ICD-10-CM

## 2015-09-07 DIAGNOSIS — E7800 Pure hypercholesterolemia, unspecified: Secondary | ICD-10-CM | POA: Diagnosis not present

## 2015-09-07 LAB — GLUCOSE, CAPILLARY
Glucose-Capillary: 363 mg/dL — ABNORMAL HIGH (ref 65–99)
Glucose-Capillary: 201 mg/dL — ABNORMAL HIGH (ref 65–99)
Glucose-Capillary: 242 mg/dL — ABNORMAL HIGH (ref 65–99)

## 2015-09-07 MED ORDER — DIAZEPAM 5 MG/ML IJ SOLN: 2.5000 mg | Freq: Three times a day (TID) | INTRAMUSCULAR | Status: DC | PRN

## 2015-09-07 MED ORDER — LIDOCAINE HCL 2 % EX GEL
1.0000 "application " | Freq: Once | CUTANEOUS | Status: AC
  Administered 2015-09-07: 1 via TOPICAL
  Filled 2015-09-07: qty 5

## 2015-09-07 MED ORDER — ASPIRIN EC 325 MG PO TBEC: 325.0000 mg | DELAYED_RELEASE_TABLET | Freq: Every day | ORAL | Status: DC

## 2015-09-07 MED ORDER — ONDANSETRON HCL 4 MG PO TABS: 4.0000 mg | ORAL_TABLET | Freq: Four times a day (QID) | ORAL | Status: DC | PRN

## 2015-09-07 MED ORDER — ACETAMINOPHEN 325 MG PO TABS
650.0000 mg | ORAL_TABLET | Freq: Four times a day (QID) | ORAL | Status: DC | PRN
  Administered 2015-09-08: 325 mg via ORAL
  Filled 2015-09-07: qty 2

## 2015-09-07 MED ORDER — SODIUM CHLORIDE 0.9% FLUSH
3.0000 mL | Freq: Two times a day (BID) | INTRAVENOUS | Status: DC
  Administered 2015-09-07 – 2015-09-09 (×4): 3 mL via INTRAVENOUS

## 2015-09-07 MED ORDER — ONDANSETRON HCL 4 MG/2ML IJ SOLN: 4.0000 mg | Freq: Four times a day (QID) | INTRAMUSCULAR | Status: DC | PRN

## 2015-09-07 MED ORDER — METHYLPREDNISOLONE SODIUM SUCC 125 MG IJ SOLR
125.0000 mg | Freq: Once | INTRAMUSCULAR | Status: DC
  Filled 2015-09-07: qty 2

## 2015-09-07 MED ORDER — MECLIZINE HCL 25 MG PO TABS: 25.0000 mg | ORAL_TABLET | Freq: Three times a day (TID) | ORAL | Status: DC | PRN

## 2015-09-07 MED ORDER — SODIUM CHLORIDE 0.9 % IV SOLN
INTRAVENOUS | Status: DC
  Administered 2015-09-07: 11:00:00 via INTRAVENOUS

## 2015-09-07 MED ORDER — ONDANSETRON HCL 4 MG/2ML IJ SOLN
4.0000 mg | Freq: Once | INTRAMUSCULAR | Status: AC
  Administered 2015-09-07: 4 mg via INTRAVENOUS
  Filled 2015-09-07: qty 2

## 2015-09-07 MED ORDER — INSULIN ASPART 100 UNIT/ML ~~LOC~~ SOLN
0.0000 [IU] | Freq: Three times a day (TID) | SUBCUTANEOUS | Status: DC
  Administered 2015-09-07: 9 [IU] via SUBCUTANEOUS
  Administered 2015-09-08: 7 [IU] via SUBCUTANEOUS
  Administered 2015-09-08: 5 [IU] via SUBCUTANEOUS

## 2015-09-07 MED ORDER — PREDNISONE 20 MG PO TABS
60.0000 mg | ORAL_TABLET | Freq: Every day | ORAL | Status: DC
  Administered 2015-09-08 – 2015-09-09 (×2): 60 mg via ORAL
  Filled 2015-09-07 (×2): qty 3

## 2015-09-07 MED ORDER — INSULIN ASPART 100 UNIT/ML ~~LOC~~ SOLN: 0.0000 [IU] | Freq: Every day | SUBCUTANEOUS | Status: DC

## 2015-09-07 MED ORDER — ENOXAPARIN SODIUM 60 MG/0.6ML ~~LOC~~ SOLN
0.5000 mg/kg | SUBCUTANEOUS | Status: DC
  Administered 2015-09-07 – 2015-09-09 (×3): 50 mg via SUBCUTANEOUS
  Filled 2015-09-07 (×3): qty 0.5

## 2015-09-07 MED ORDER — ACETAMINOPHEN 650 MG RE SUPP: 650.0000 mg | Freq: Four times a day (QID) | RECTAL | Status: DC | PRN

## 2015-09-07 NOTE — ED Provider Notes (Signed)
Pt still having significant vertigo upon ambulation She is still vomiting despite zofran Will admit D/w dr opyd for admission Pt denies CP/abd pain   Ripley Fraise, MD 09/07/15 [phone]

## 2015-09-07 NOTE — ED Provider Notes (Signed)
Pt seen by dr Lavone Neri with neurology He does not feel this represents acute stroke and does not represent acute dissection He feels likely labyrinthitis Pt stable at this time He recommend ASA 325mg  daily per his note, and will also add on antivert Advised neuro f/u Friend at bedside to assist patient Will ambulate prior to d/c home   Ripley Fraise, MD 09/07/15 0407

## 2015-09-07 NOTE — Care Management Note (Signed)
Case Management Note  Patient Details  Name: Ninna Wilt MRN: [id number] Date of Birth: March 22, 1946  Subjective/Objective:       Patient admitted with vertigo, N & V. Patient is from home.  No insurance listed.            Action/Plan: PT recommends HHPT. CM following for discharge disposition.   Expected Discharge Date:                  Expected Discharge Plan:     In-House Referral:     Discharge planning Services     Post Acute Care Choice:    Choice offered to:     DME Arranged:    DME Agency:     HH Arranged:    HH Agency:     Status of Service:  In process, will continue to follow  Medicare Important Message Given:    Date Medicare IM Given:    Medicare IM give by:    Date Additional Medicare IM Given:    Additional Medicare Important Message give by:     If discussed at [place] of Stay Meetings, dates discussed:    Additional Comments:  Pollie Friar, RN 09/07/2015, 3:23 PM

## 2015-09-07 NOTE — Discharge Instructions (Signed)
No driving until all symptoms are gone  Mareos (Dizziness) Los mareos son un problema muy frecuente. Se trata de una sensacin de inestabilidad o de desvanecimiento. Puede sentir que se va a desmayar. Un mareo puede provocarle una lesin si se tropieza o se cae. Las Engineer, manufacturing de todas las edades pueden sufrir Tree surgeon, Armed forces training and education officer es ms frecuente en los adultos [place]. Esta afeccin puede tener muchas causas, entre las que se pueden [employer], la deshidratacin y [place]. INSTRUCCIONES PARA EL CUIDADO EN EL HOGAR Estas indicaciones pueden ayudarlo con el trastorno: Comida y bebida  Beba suficiente lquido para Theatre manager la orina clara o de color amarillo plido. Esto evita la deshidratacin. Trate de beber ms lquidos transparentes, como agua.  No beba alcohol.  Limite el consumo de cafena si el mdico se lo indica.  Limite el consumo de sal si el mdico se lo indica. Actividad  Evite los movimientos rpidos.  Levntese de las sillas con lentitud y apyese hasta sentirse bien.  Por la maana, sintese primero a un lado de la cama. Cuando se sienta bien, pngase lentamente de pie mientras se sostiene de algo, hasta que sepa que ha logrado el equilibrio.  Mueva las piernas con frecuencia si debe estar de pie en un lugar durante mucho tiempo. Mientras est de pie, contraiga y relaje los msculos de las piernas.  No conduzca vehculos ni opere maquinaria pesada si se siente mareado.  Evite agacharse si se siente mareado. En su casa, coloque los objetos de modo que le resulte fcil alcanzarlos sin Office manager. Estilo de vida  No consuma ningn producto que contenga tabaco, lo que incluye cigarrillos, tabaco de Higher education careers adviser o Psychologist, sport and exercise. Si necesita ayuda para dejar de fumar, consulte al [employer].  Trate de reducir el nivel de estrs practicando actividades como el yoga o la meditacin. Hable con el mdico si necesita ayuda. Instrucciones generales  Controle sus  mareos para ver si hay cambios.  Tome los medicamentos solamente como se lo haya indicado el mdico. Hable con el mdico si cree que los medicamentos que est tomando son la causa de sus mareos.  Infrmele a un amigo o a un familiar si se siente mareado. Pdale a esta persona que llame al mdico si observa cambios en su comportamiento.  Concurra a todas las visitas de control como se lo haya indicado el mdico. Esto es importante. SOLICITE ATENCIN MDICA SI:  Los [employer].  Los [employer] o la sensacin de Engineer, petroleum.  Siente nuseas.  Ha perdido la audicin.  Aparecen nuevos sntomas.  Cuando est de pie se siente inestable o que la habitacin da vueltas. SOLICITE ATENCIN MDICA DE INMEDIATO SI:  Vomita o tiene diarrea y no puede comer ni beber nada.  Tiene dificultad para hablar, para caminar, para tragar o para [employer], las [place] piernas.  Siente una debilidad generalizada.  No piensa con claridad o tiene dificultades para armar oraciones. Es posible que un amigo o un familiar adviertan que esto ocurre.  Tiene dolor de pecho, dolor abdominal, sudoracin o Risk manager.  Hay cambios en la visin.  Observa un sangrado.  Tiene dolores de [place].  Tiene dolor o rigidez en el cuello.  Tiene fiebre.   Esta informacin no tiene Marine scientist el consejo del mdico. Asegrese de hacerle al mdico cualquier pregunta que tenga.   Document Released: 06/25/2005 Document Revised: 11/09/2014 Elsevier Interactive Patient Education [employer].

## 2015-09-07 NOTE — Evaluation (Signed)
Physical Therapy Evaluation Patient Details Name: Emily Hoover MRN: [id number] DOB: 1946-04-30 Today's Date: 09/07/2015   History of Present Illness  Pt is a spanish speaking 70 y/o female who presents s/p nausea, vomiting, cervical pain, and dizziness. MRI negative for acute changes.  Clinical Impression  Pt admitted with above diagnosis. Pt currently with functional limitations due to the deficits listed below (see PT Problem List). At the time of PT eval pt was able to perform transfers and ambulation with modified independence to occasional min assist for balance. Pt endorses dizziness sporadically throughout session but no testing therapist did brought on dizziness. May benefit from [facility] use next session. Pt will benefit from skilled PT to increase their independence and safety with mobility to allow discharge to the venue listed below.       Follow Up Recommendations Home health PT    Equipment Recommendations  Cane    Recommendations for Other Services       Precautions / Restrictions Precautions Precautions: Fall Restrictions Weight Bearing Restrictions: No      Mobility  Bed Mobility Overal bed mobility: Independent             General bed mobility comments: No assist required. HOB flat and rails lowered to simulate home environment.   Transfers Overall transfer level: Modified independent               General transfer comment: No assist required. No unsteadiness noted.   Ambulation/Gait Ambulation/Gait assistance: Min assist Ambulation Distance (Feet): 400 Feet Assistive device: None Gait Pattern/deviations: Step-through pattern;Decreased stride length;Drifts right/left Gait velocity: Decreased Gait velocity interpretation: Below normal speed for age/gender General Gait Details: Occasional min assist for LOB (mainly to the left side).  Stairs            Wheelchair Mobility    Modified Rankin (Stroke Patients Only)       Balance  Overall balance assessment: Needs assistance Sitting-balance support: Feet supported;No upper extremity supported Sitting balance-Leahy Scale: Normal     Standing balance support: No upper extremity supported;During functional activity Standing balance-Leahy Scale: Poor Standing balance comment: Occasional assist required for dynamic balance.                             Pertinent Vitals/Pain Pain Assessment: Faces Faces Pain Scale: Hurts little more Pain Location: posterior neck Pain Descriptors / Indicators: Sharp (Stretching) Pain Intervention(s): Monitored during session;Repositioned    Home Living Family/patient expects to be discharged to:: Private residence Living Arrangements: Other relatives Biomedical scientist) Available Help at Discharge: Family;Available PRN/intermittently Type of Home: Mobile home Home Access: Stairs to enter   Entrance Stairs-Number of Steps: 5 Home Layout: One level Home Equipment: None      Prior Function Level of Independence: Independent               Hand Dominance   Dominant Hand: Right    Extremity/Trunk Assessment   Upper Extremity Assessment: Defer to OT evaluation           Lower Extremity Assessment: Overall WFL for tasks assessed      Cervical / Trunk Assessment: Other exceptions  Communication   Communication: Prefers language other than English (Spanish)  Cognition Arousal/Alertness: Awake/alert Behavior During Therapy: WFL for tasks assessed/performed Overall Cognitive Status: Difficult to assess                      General Comments  Exercises        Assessment/Plan    PT Assessment Patient needs continued PT services  PT Diagnosis Difficulty walking   PT Problem List Decreased activity tolerance;Decreased balance;Decreased coordination;Pain  PT Treatment Interventions DME instruction;Gait training;Stair training;Functional mobility training;Therapeutic activities;Therapeutic  exercise;Neuromuscular re-education;Patient/family education   PT Goals (Current goals can be found in the Care Plan section) Acute Rehab PT Goals Patient Stated Goal: Decrease pain PT Goal Formulation: With patient/family Time For Goal Achievement: 09/14/15 Potential to Achieve Goals: Good    Frequency Min 3X/week   Barriers to discharge Decreased caregiver support Pt is alone at home during the day    Co-evaluation               End of Session Equipment Utilized During Treatment: Gait belt Activity Tolerance: Patient tolerated treatment well Patient left: in chair;with call bell/phone within reach;with family/visitor present Nurse Communication: Mobility status    Functional Assessment Tool Used: Clinical judgement Functional Limitation: Mobility: Walking and moving around Mobility: Walking and Moving Around Current Status [phone]): At least 1 percent but less than 20 percent impaired, limited or restricted Mobility: Walking and Moving Around Goal Status [phone]): At least 1 percent but less than 20 percent impaired, limited or restricted    Time: 1037-1102 PT Time Calculation (min) (ACUTE ONLY): 25 min   Charges:   PT Evaluation $PT Eval Moderate Complexity: 1 Procedure PT Treatments $Gait Training: 8-22 mins   PT G Codes:   PT G-Codes **NOT FOR INPATIENT CLASS** Functional Assessment Tool Used: Clinical judgement Functional Limitation: Mobility: Walking and moving around Mobility: Walking and Moving Around Current Status [id number]): At least 1 percent but less than 20 percent impaired, limited or restricted Mobility: Walking and Moving Around Goal Status [phone]): At least 1 percent but less than 20 percent impaired, limited or restricted    Rolinda Roan 09/07/2015, 2:57 PM   Rolinda Roan, PT, DPT Acute Rehabilitation Services Pager: [phone]

## 2015-09-07 NOTE — ED Notes (Signed)
Pt ambulated to the restroom and back to room independently without difficulty.

## 2015-09-07 NOTE — Progress Notes (Deleted)
Discharge orders received, Pt for discharge home today to [facility]. IV d/c'd. D/c instructions and RX given with verbalized understanding. Report called spoke with ERA, LPN.  PTAR transported. 09/07/15 1550

## 2015-09-07 NOTE — ED Provider Notes (Signed)
I assumed care at signout to f/u on MR imaging I spoke to dr Lavone Neri with neurology He will see patient while she is awaiting MR imaging   Ripley Fraise, MD 09/07/15 0140
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
## 2015-09-07 NOTE — Progress Notes (Addendum)
Inpatient Diabetes Program Recommendations  AACE/ADA: New Consensus Statement on Inpatient Glycemic Control (2015)  Target Ranges:  Prepandial:   less than 140 mg/dL      Peak postprandial:   less than 180 mg/dL (1-2 hours)      Critically ill patients:  140 - 180 mg/dL   Review of Glycemic Control  Diabetes history: DM 2 per H&P metformin 500 BID at home Current orders for Inpatient glycemic control: none  Inpatient Diabetes Program Recommendations: Correction (SSI): Patient having elevated glucose levels on admission. Consider ordering CBGs and Novolog Moderate correction TID. HgbA1C: CBG and lab glucose measured yesterday in the mid 300s. Please obtain an A1c to assess glucose levels over the past 2-3 months.  Thanks,  Tama Headings RN, MSN, [facility] Inpatient Diabetes Coordinator Team Pager [phone] (8a-5p)

## 2015-09-07 NOTE — Progress Notes (Signed)
Pt arrived to [facility] from [facility]. Pt is spanish-speaking and accompanied by neighbor.  Pt reports nausea has resolved but she still feesl some dizziness. Vitals taken and are stable. Will report off to on-coming RN.
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
## 2015-09-07 NOTE — ED Notes (Addendum)
Pt began vomiting after ambulating. Pt still dizzy and nauseous. Dr Christy Gentles notified and to order zofran. Recheck patient  And reambulate pt in 30 minutes after zofran to assess ability to be discharged.

## 2015-09-07 NOTE — ED Notes (Signed)
Neurologist in room with pt.

## 2015-09-07 NOTE — ED Notes (Signed)
Dr Christy Gentles in room to discuss admission with patient.

## 2015-09-07 NOTE — ED Notes (Signed)
Pt ambulated to restroom without difficulty. Pt still complaining of nausea and still vomiting yellow bile. Dr Christy Gentles notified and is going to come see the patient.

## 2015-09-08 DIAGNOSIS — H8309 Labyrinthitis, unspecified ear: Secondary | ICD-10-CM

## 2015-09-08 DIAGNOSIS — R1111 Vomiting without nausea: Secondary | ICD-10-CM

## 2015-09-08 DIAGNOSIS — R42 Dizziness and giddiness: Secondary | ICD-10-CM | POA: Diagnosis not present

## 2015-09-08 LAB — BASIC METABOLIC PANEL
ANION GAP: 12 (ref 5–15)
BUN: 19 mg/dL (ref 6–20)
CHLORIDE: 101 mmol/L (ref 101–111)
CO2: 22 mmol/L (ref 22–32)
Calcium: 9.5 mg/dL (ref 8.9–10.3)
Creatinine, Ser: 0.66 mg/dL (ref 0.44–1.00)
GFR calc Af Amer: >60 mL/min (ref >60)
Glucose, Bld: 410 mg/dL — ABNORMAL HIGH (ref 65–99)
POTASSIUM: 4.1 mmol/L (ref 3.5–5.1)
SODIUM: 135 mmol/L (ref 135–145)

## 2015-09-08 LAB — CBC
HEMATOCRIT: 38.9 % (ref 36.0–46.0)
HEMOGLOBIN: 13.2 g/dL (ref 12.0–15.0)
MCH: 29.2 pg (ref 26.0–34.0)
MCHC: 33.9 g/dL (ref 30.0–36.0)
MCV: 86.1 fL (ref 78.0–100.0)
Platelets: 267 10*3/uL (ref 150–400)
RBC: 4.52 MIL/uL (ref 3.87–5.11)
RDW: 13.2 % (ref 11.5–15.5)
WBC: 4.4 10*3/uL (ref 4.0–10.5)

## 2015-09-08 LAB — COMPREHENSIVE METABOLIC PANEL
ALBUMIN: 3 g/dL — AB (ref 3.5–5.0)
ALK PHOS: 71 U/L (ref 38–126)
ALT: 13 U/L — AB (ref 14–54)
AST: 16 U/L (ref 15–41)
Anion gap: 7 (ref 5–15)
BILIRUBIN TOTAL: 1.1 mg/dL (ref 0.3–1.2)
BUN: 14 mg/dL (ref 6–20)
CALCIUM: 9 mg/dL (ref 8.9–10.3)
CO2: 25 mmol/L (ref 22–32)
CREATININE: 0.48 mg/dL (ref 0.44–1.00)
Chloride: 106 mmol/L (ref 101–111)
GFR calc Af Amer: >60 mL/min (ref >60)
GFR calc non Af Amer: >60 mL/min (ref >60)
GLUCOSE: 267 mg/dL — AB (ref 65–99)
Potassium: 3.6 mmol/L (ref 3.5–5.1)
SODIUM: 138 mmol/L (ref 135–145)
Total Protein: 6.4 g/dL — ABNORMAL LOW (ref 6.5–8.1)

## 2015-09-08 LAB — GLUCOSE, CAPILLARY
GLUCOSE-CAPILLARY: 288 mg/dL — AB (ref 65–99)
GLUCOSE-CAPILLARY: 327 mg/dL — AB (ref 65–99)
Glucose-Capillary: 412 mg/dL — ABNORMAL HIGH (ref 65–99)
Glucose-Capillary: 429 mg/dL — ABNORMAL HIGH (ref 65–99)
Glucose-Capillary: 174 mg/dL — ABNORMAL HIGH (ref 65–99)

## 2015-09-08 LAB — HEMOGLOBIN A1C
HEMOGLOBIN A1C: 13.9 % — AB (ref 4.8–5.6)
MEAN PLASMA GLUCOSE: 352 mg/dL

## 2015-09-08 MED ORDER — INSULIN ASPART 100 UNIT/ML ~~LOC~~ SOLN: 0.0000 [IU] | Freq: Every day | SUBCUTANEOUS | Status: DC

## 2015-09-08 MED ORDER — INSULIN ASPART 100 UNIT/ML ~~LOC~~ SOLN
0.0000 [IU] | Freq: Three times a day (TID) | SUBCUTANEOUS | Status: DC
  Administered 2015-09-08: 15 [IU] via SUBCUTANEOUS
  Administered 2015-09-09 (×2): 5 [IU] via SUBCUTANEOUS

## 2015-09-08 NOTE — Evaluation (Signed)
Occupational Therapy Evaluation Patient Details Name: Emily Hoover MRN: [id number] DOB: Nov 22, 1945 Today's Date: 09/08/2015    History of Present Illness Pt is a spanish speaking 70 y/o female who presents s/p nausea, vomiting, cervical pain, and dizziness. MRI negative for acute changes.   Clinical Impression   Pt reports she was independent with ADLs and mobility PTA. Currently pt is overall mod I to supervision for safety with ADLs and functional mobility. Educated on home safety and shower chair for safety with bathing. Pt planning to d/c home with intermittent supervision from family. Pts grandson reports that family may be able to provide 24/7 supervision if necessary. Pt with no further acute OT needs identified; signing off at this time. Please re-consult if change in medical status occurs. Thank you for this referral.    Follow Up Recommendations  No OT follow up;Supervision/Assistance - 24 hour    Equipment Recommendations  Tub/shower seat    Recommendations for Other Services       Precautions / Restrictions Precautions Precautions: Fall Restrictions Weight Bearing Restrictions: No      Mobility Bed Mobility Overal bed mobility: Independent                Transfers Overall transfer level: Modified independent Equipment used: None             General transfer comment: No assist required. No unsteadiness noted.     Balance Overall balance assessment: Needs assistance Sitting-balance support: Feet unsupported;No upper extremity supported Sitting balance-Leahy Scale: Normal     Standing balance support: No upper extremity supported;During functional activity Standing balance-Leahy Scale: Fair                              ADL Overall ADL's : Needs assistance/impaired Eating/Feeding: Modified independent;Sitting   Grooming: Supervision/safety;Wash/dry hands;Standing   Upper Body Bathing: Supervision/ safety;Sitting   Lower Body  Bathing: Supervison/ safety;Sit to/from stand   Upper Body Dressing : Set up;Sitting   Lower Body Dressing: Supervision/safety;Sit to/from stand   Toilet Transfer: Supervision/safety;Ambulation;Regular Toilet   Toileting- Water quality scientist and Hygiene: Supervision/safety;Sit to/from stand       Functional mobility during ADLs: Supervision/safety General ADL Comments: Pts grandson present and interpreting throughout session. Educated pt on home safety, seat in shower for safety; pt agreeable and reports she has wanted to get a chair for the shower.     Vision     Perception     Praxis      Pertinent Vitals/Pain Pain Assessment: 0-10 Pain Score: 5  Pain Location: neck +dizziness Pain Intervention(s): Monitored during session     Hand Dominance Right   Extremity/Trunk Assessment Upper Extremity Assessment Upper Extremity Assessment: Overall WFL for tasks assessed   Lower Extremity Assessment Lower Extremity Assessment: Defer to PT evaluation   Cervical / Trunk Assessment Cervical / Trunk Assessment: Other exceptions   Communication Communication Communication: Prefers language other than English ([place]. Grandson interpreted)   Cognition Arousal/Alertness: Awake/alert Behavior During Therapy: WFL for tasks assessed/performed Overall Cognitive Status: Difficult to assess                     General Comments       Exercises       Shoulder Instructions      Home Living Family/patient expects to be discharged to:: Private residence Living Arrangements: Other relatives (grandson) Available Help at Discharge: Family;Available PRN/intermittently (grandson works during the day) Type of Home:  Mobile home Home Access: Stairs to enter Entrance Stairs-Number of Steps: 5   Home Layout: One level     Bathroom Shower/Tub: Occupational psychologist: Standard Bathroom Accessibility: Yes   Home Equipment: None          Prior  Functioning/Environment Level of Independence: Independent             OT Diagnosis: Disturbance of vision   OT Problem List:     OT Treatment/Interventions:      OT Goals(Current goals can be found in the care plan section) Acute Rehab OT Goals Patient Stated Goal: be independent again OT Goal Formulation: All assessment and education complete, DC therapy  OT Frequency:     Barriers to D/C:            Co-evaluation              End of Session Equipment Utilized During Treatment: Gait belt  Activity Tolerance: Patient tolerated treatment well Patient left: in bed;with call bell/phone within reach;with family/visitor present (sitting EOB)   Time: [id number] OT Time Calculation (min): 13 min Charges:  OT General Charges $OT Visit: 1 Procedure OT Evaluation $OT Eval Low Complexity: 1 Procedure G-Codes: OT G-codes **NOT FOR INPATIENT CLASS** Functional Assessment Tool Used: Clinical judgement Functional Limitation: Self care Self Care Current Status [id number]): At least 1 percent but less than 20 percent impaired, limited or restricted Self Care Goal Status [id number]): At least 1 percent but less than 20 percent impaired, limited or restricted Self Care Discharge Status [phone]): At least 1 percent but less than 20 percent impaired, limited or restricted   Binnie Kand M.S., OTR/L Pager: [phone]  09/08/2015, 10:02 AM

## 2015-09-08 NOTE — Progress Notes (Signed)
[facility] PROGRESS NOTE  Emily Hoover [id number] DOB: March 22, 1946 DOA: 09/06/2015 PCP: [facility]  Assessment/Plan: Active Problems:   Vertigo/Labyrinthitis -  Vertigo most likely secondary to labyrinthitis. Improving on current regimen. We'll continue medical regimen.    Neck pain - Continue acetaminophen    Diabetes mellitus without complication (HCC) - Currently not well controlled - Will discontinue sensitive sliding scale insulin and place on moderate scale sliding insulin - Obtain hemoglobin A1c    Uncontrollable vomiting - Improved continue supportive therapy   Code Status: Full Family Communication: *No family at bedside Disposition Plan: Pending continued improvement in condition. Most likely DC next a.m.   Consultants:  Neurology  Procedures:  None  Antibiotics:  None  HPI/Subjective: Patient has no new complaints no acute issues overnight. Reports feeling better. Still reports vertigo but less severe than the initial presentation  Objective: Filed Vitals:   09/08/15 1006 09/08/15 1359  BP: 133/78 131/79  Pulse: 73 90  Temp: 97.8 F (36.6 C) 98.4 F (36.9 C)  Resp: 20 20   No intake or output data in the 24 hours ending 09/08/15 1747 Filed Weights   09/07/15 0644  Weight: 95.8 kg (211 lb 3.2 oz)    Exam:   General:  Pt in nad, alert and awake  Cardiovascular: no cyanosis  Respiratory: no increased wob, no wheezes  Abdomen: nd, NT, no guarding  Musculoskeletal: no cyanosis   Data Reviewed: Basic Metabolic Panel:  Recent Labs Lab 09/06/15 1806 09/08/15 0213  NA 136 138  K 3.7 3.6  CL 99* 106  CO2 22 25  GLUCOSE 399* 267*  BUN 15 14  CREATININE 0.62 0.48  CALCIUM 9.5 9.0   Liver Function Tests:  Recent Labs Lab 09/08/15 0213  AST 16  ALT 13*  ALKPHOS 71  BILITOT 1.1  PROT 6.4*  ALBUMIN 3.0*   No results for input(s): LIPASE, AMYLASE in the last 168 hours. No results for  input(s): AMMONIA in the last 168 hours. CBC:  Recent Labs Lab 09/06/15 1806 09/08/15 0213  WBC 5.1 4.4  HGB 12.8 13.2  HCT 37.1 38.9  MCV 85.7 86.1  PLT 301 267   Cardiac Enzymes: No results for input(s): CKTOTAL, CKMB, CKMBINDEX, TROPONINI in the last 168 hours. BNP (last 3 results) No results for input(s): BNP in the last 8760 hours.  ProBNP (last 3 results) No results for input(s): PROBNP in the last 8760 hours.  CBG:  Recent Labs Lab 09/07/15 2119 09/08/15 0637 09/08/15 1121 09/08/15 1645 09/08/15 1650  GLUCAP 242* 288* 327* 429* 412*    No results found for this or any previous visit (from the past 240 hour(s)).   Studies: Ct Angio Neck W/cm &/or Wo/cm  09/07/2015  ADDENDUM REPORT: 09/07/2015 09:38 ADDENDUM: I have been asked to review this case. While there is minimal luminal irregularity of the vertebral arteries, I think it is more likely this is due to atherosclerosis than a grade 1 injury. I think there is actually similar irregularity on the left, and certainly this patient does have diffuse atherosclerosis that could result in this degree of irregularity. I do think the left-sided facet arthropathy at C3-4 could be a cause of chronic neck pain. Electronically Signed   By: Nelson Chimes M.D.   On: 09/07/2015 09:38  09/07/2015  CLINICAL DATA:  Posterior neck pain with extension and dizziness. Assess for vertebral artery stenosis or dissection. History of hypertension, hypercholesterolemia and diabetes. EXAM: CT ANGIOGRAPHY NECK TECHNIQUE:  Multidetector CT imaging of the neck was performed using the standard protocol during bolus administration of intravenous contrast. Multiplanar CT image reconstructions and MIPs were obtained to evaluate the vascular anatomy. Carotid stenosis measurements (when applicable) are obtained utilizing NASCET criteria, using the distal internal carotid diameter as the denominator. CONTRAST:  44mL OMNIPAQUE IOHEXOL 350 MG/ML SOLN COMPARISON:   None. FINDINGS: Aortic arch: Normal appearance of the thoracic arch, normal branch pattern. Mild calcific atherosclerosis of the aortic arch. The origins of the innominate, left Common carotid artery and subclavian artery are widely patent. Right carotid system: Common carotid artery is widely patent, coursing in a straight line fashion. Mild anterior intimal thickening in RIGHT Common carotid artery. Normal appearance of the carotid bifurcation without hemodynamically significant stenosis by NASCET criteria. Normal appearance of the included internal carotid artery. Left carotid system: Common carotid artery is widely patent, coursing in a straight line fashion. Normal appearance of the carotid bifurcation without hemodynamically significant stenosis by NASCET criteria. Normal appearance of the included internal carotid artery. Vertebral arteries:Left vertebral artery is dominant. LEFT V1 and old without dissection. Mild luminal regularity of the RIGHT vertebral artery throughout the V1, V2 and V3 segments. No dissection flap, significant intimal hematoma or pseudo aneurysm. Skeleton: No acute osseous process though bone windows have not been submitted. Grade 1 C3-4 anterolisthesis with mild LEFT facet widening and periarticular sclerosis. Other neck: Soft tissues of the neck are nonacute though, not tailored for evaluation. Small RIGHT hilar calcified lymph nodes. IMPRESSION: Luminal irregularity RIGHT vertebral artery concerning for minimal intimal injury (BC VI grade 1) without discrete dissection flap or flow limiting stenosis. Mild probable inflammatory changes of LEFT C3-4 facet with grade 1 C3-4 anterolisthesis. Acute findings discussed with and reconfirmed by Dr.DAN FLOYD on 09/07/2015 at 12:43 am. Electronically Signed: By: Elon Alas M.D. On: 09/07/2015 00:43   Mr Brain Wo Contrast  09/07/2015  CLINICAL DATA:  Dizziness beginning yesterday. Chronic neck pain. History of hypertension,  hypercholesterolemia and diabetes. Follow-up RIGHT vertebral artery injury. EXAM: MRI HEAD WITHOUT CONTRAST TECHNIQUE: Multiplanar, multiecho pulse sequences of the brain and surrounding structures were obtained without intravenous contrast. COMPARISON:  None. FINDINGS: The ventricles and sulci are normal for patient's age. No abnormal parenchymal signal, mass lesions, mass effect. A few scattered subcentimeter supratentorial white matter FLAIR T2 hyperintensities are less than expected for age. No reduced diffusion to suggest acute ischemia. No susceptibility artifact to suggest hemorrhage. No abnormal extra-axial fluid collections. No extra-axial masses though, contrast enhanced sequences would be more sensitive. Normal major intracranial vascular flow voids seen at the skull base. Ocular globes and orbital contents are unremarkable though not tailored for evaluation. No abnormal sellar expansion. No suspicious calvarial bone marrow signal. Craniocervical junction maintained. RIGHT maxillary mucosal retention cyst, mild paranasal sinus mucosal thickening. Mastoid air cells are well aerated. IMPRESSION: Normal noncontrast MRI of the brain for age. Electronically Signed   By: Elon Alas M.D.   On: 09/07/2015 03:04    Scheduled Meds: . enoxaparin (LOVENOX) injection  0.5 mg/kg Subcutaneous Q24H  . insulin aspart  0-15 Units Subcutaneous TID WC  . insulin aspart  0-5 Units Subcutaneous QHS  . methylPREDNISolone (SOLU-MEDROL) injection  125 mg Intravenous Once  . predniSONE  60 mg Oral Q breakfast  . sodium chloride flush  3 mL Intravenous Q12H   Continuous Infusions: . sodium chloride 75 mL/hr at 09/07/15 1030    Time spent: > 35 minutes  Velvet Bathe  [facility] Pager [phone] If  7PM-7AM, please contact night-coverage at [email or web address], password [email or web address] 09/08/2015, 5:47 PM

## 2015-09-08 NOTE — Progress Notes (Signed)
Interpreter Lesle Chris for [email or web address].PT

## 2015-09-08 NOTE — Progress Notes (Signed)
Patient blood glucose is 429, paged Dr. Wendee Beavers, given new orders to give 15 units of insulin now.

## 2015-09-08 NOTE — Progress Notes (Signed)
Physical Therapy Treatment & Vestibular Evaluation Patient Details Name: Emily Hoover MRN: [id number] DOB: May 12, 1946 Today's Date: 09/08/2015    History of Present Illness Pt is a spanish speaking 70 y/o female who presents s/p nausea, vomiting, cervical pain, and dizziness. MRI negative for acute changes.    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  Most dizzy lying supine with head turned R, but doesn't seem to impact function except with eyes closed during balance challenge.  Did educate on fall prevention especially with activities with eyes closed.  Patient relates lots of stress with family situation, finances, and her health which she has ignored for a long time.  Did contact case manager and seems to set up with medical follow up on d/c.  Cannot access [facility] services as unable to pay out of pocket.  Will continue to  follow acutely.  Follow Up Recommendations  No PT follow up     Equipment Recommendations  None recommended by PT    Recommendations for Other Services       Precautions / Restrictions Precautions Precautions: Fall Restrictions Weight Bearing Restrictions: No    Mobility  Bed Mobility Overal bed mobility: Independent                Transfers Overall transfer level: Independent Equipment used: None             General transfer comment: No assist required. No unsteadiness noted.   Ambulation/Gait Ambulation/Gait assistance: Independent Ambulation Distance (Feet): 300 Feet Assistive device: None Gait Pattern/deviations: Step-through pattern;Decreased stride length;Drifts right/left     General Gait Details: assist to hold up her gown due to too long able to walk and talk and turn head without significant LOB, one episode of needing cue to avoid obstacle on the L   Stairs Stairs: Yes Stairs assistance: Supervision Stair Management: Alternating pattern;Step to pattern;Forwards Number of Stairs:  3 (x 2) General stair comments: demonstrates occasional step to versus step through with descent  Wheelchair Mobility    Modified Rankin (Stroke Patients Only)       Balance Overall balance assessment: Needs assistance Sitting-balance support: Feet unsupported;No upper extremity supported Sitting balance-Leahy Scale: Good     Standing balance support: No upper extremity supported;During functional activity Standing balance-Leahy Scale: Fair   Single Leg Stance - Right Leg: 7 Single Leg Stance - Left Leg: 3       Rhomberg - Eyes Closed: 30 (with supervision)        Cognition Arousal/Alertness: Awake/alert Behavior During Therapy: WFL for tasks assessed/performed Overall Cognitive Status: Within Functional Limits for tasks assessed                      Exercises      General Comments General comments (skin integrity, edema, etc.): Educated on fall prevention with lighting, footwear and caution in the shower   Vestibular Testing  09/08/15 0001  Symptom Behavior  Type of Dizziness Spinning  Frequency of Dizziness intermittent  Duration of Dizziness several seconds  Aggravating Factors Lying supine;Forward bending  Relieving Factors Head stationary  Occulomotor Exam  Occulomotor Alignment Abnormal (left eye lower in orbit, reports limited vision R eye)  Spontaneous Absent  Gaze-induced Absent  Head shaking Horizontal Absent  Head Shaking Vertical Absent (but reports dizziness 2/10)  Smooth Pursuits Intact  Saccades Intact  Vestibulo-Occular Reflex  VOR 1 Head Only (x 1 viewing) 30 sec mild symptoms with vertical VOR  VOR  to Slow Head Movement Positive right  VOR Cancellation Corrective saccades (when turning head R first time only)  Auditory  Comments decreased to scratch test R ear as compared to L  Positional Testing  Sidelying Test Sidelying Right;Sidelying Left  Horizontal Canal Testing Horizontal Canal Right;Horizontal Canal Left  Sidelying  Right  Sidelying Right Duration 30 sec   Sidelying Right Symptoms No nystagmus  Sidelying Left  Sidelying Left Duration 30 sec  Sidelying Left Symptoms No nystagmus  Horizontal Canal Right  Horizontal Canal Right Duration 30 sec  Horizontal Canal Right Symptoms Normal (but reports 4/10 symptoms and ceiling moving)  Horizontal Canal Left  Horizontal Canal Left Duration 30 sec  Horizontal Canal Left Symptoms Normal (2/10 symptoms )        Pertinent Vitals/Pain Pain Assessment: 0-10 Pain Score: 5  Faces Pain Scale: Hurts a little bit Pain Location: neck Pain Descriptors / Indicators: Aching Pain Intervention(s): Repositioned;Monitored during session    Home Living Family/patient expects to be discharged to:: Private residence Living Arrangements: Other relatives (grandson) Available Help at Discharge: Family;Available PRN/intermittently (grandson works during the day) Type of Home: Mobile home Home Access: Stairs to enter   [place]: One level Home Equipment: None      Prior Function Level of Independence: Independent          PT Goals (current goals can now be found in the care plan section) Acute Rehab PT Goals Patient Stated Goal: be independent again Progress towards PT goals: Progressing toward goals    Frequency  Min 3X/week    PT Plan Current plan remains appropriate    Co-evaluation             End of Session   Activity Tolerance: Patient tolerated treatment well Patient left: with call bell/phone within reach;in bed;with family/visitor present     Time: [id number] PT Time Calculation (min) (ACUTE ONLY): 54 min  Charges:  $Gait Training: 23-37 mins $Neuromuscular Re-education: 23-37 mins                    G Codes:      Reginia Naas September 23, 2015, 11:53 AM  Magda Kiel, [place] September 23, 2015

## 2015-09-09 DIAGNOSIS — R42 Dizziness and giddiness: Secondary | ICD-10-CM | POA: Diagnosis not present

## 2015-09-09 LAB — GLUCOSE, CAPILLARY
GLUCOSE-CAPILLARY: 249 mg/dL — AB (ref 65–99)
Glucose-Capillary: 220 mg/dL — ABNORMAL HIGH (ref 65–99)

## 2015-09-09 LAB — HEMOGLOBIN A1C
Hgb A1c MFr Bld: 13.6 % — ABNORMAL HIGH (ref 4.8–5.6)
MEAN PLASMA GLUCOSE: 344 mg/dL

## 2015-09-09 MED ORDER — METFORMIN HCL 500 MG PO TABS: 1000.0000 mg | ORAL_TABLET | Freq: Two times a day (BID) | ORAL | Status: DC

## 2015-09-09 MED ORDER — PREDNISONE 10 MG (21) PO TBPK: 10.0000 mg | ORAL_TABLET | Freq: Every day | ORAL | Status: DC

## 2015-09-09 NOTE — Progress Notes (Addendum)
Inpatient Diabetes Program Recommendations  AACE/ADA: New Consensus Statement on Inpatient Glycemic Control (2015)  Target Ranges:  Prepandial:   less than 140 mg/dL      Peak postprandial:   less than 180 mg/dL (1-2 hours)      Critically ill patients:  140 - 180 mg/dL   Review of Glycemic Control:  Results for Emily Hoover, Emily Hoover (MRN [id number]) as of 09/09/2015 10:58  Ref. Range 09/06/2015 17:44 09/07/2015 14:57 09/07/2015 16:46 09/07/2015 21:19 09/08/2015 06:37 09/08/2015 11:21 09/08/2015 16:45 09/08/2015 16:50 09/08/2015 21:13 09/09/2015 06:23  Glucose-Capillary Latest Ref Range: 65-99 mg/dL 375 (H) 363 (H) 201 (H) 242 (H) 288 (H) 327 (H) 429 (H) 412 (H) 174 (H) 220 (H)  Results for Emily Hoover, Emily Hoover (MRN [id number]) as of 09/09/2015 10:58  Ref. Range 09/08/2015 17:16  Hemoglobin A1C Latest Ref Range: 4.8-5.6 % 13.6 (H)   Diabetes history: Type 2 diabetes Outpatient Diabetes medications: Metformin 500 mg bid Current orders for Inpatient glycemic control:  Novolog moderate tid with meals, Prednisone 60 mg daily  Inpatient Diabetes Program Recommendations: A1C indicates average blood sugars 378 mg/dL prior to admit.  Patient likely will need insulin to control diabetes and blood sugars.  It appears that her PCP is at [facility].  May consider adding 70/30 14 units bid to control blood sugars.  Note that Novolin 70/30 is 24.88$ per vial at [facility]. Will talk to patient.   Thanks, Adah Perl, RN, BC-ADM Inpatient Diabetes Coordinator Pager [phone] (8a-5p)

## 2015-09-09 NOTE — Progress Notes (Signed)
Note that patient refuses insulin start.  She is to follow-up at PCP in 1 week.  Needs close follow-up for blood sugars and diabetes management.   Thanks, Adah Perl, RN, BC-ADM Inpatient Diabetes Coordinator Pager [phone] (8a-5p)

## 2015-09-09 NOTE — Discharge Summary (Signed)
Physician Discharge Summary  Dilan Waldmann [id number] DOB: Aug 22, 1945 DOA: 09/06/2015  PCP: [place] date: 09/06/2015 Discharge date: 09/09/2015  Time spent: > 35 minutes  Recommendations for Outpatient Follow-up:  1. Monitor blood sugars and adjust hypoglycemic agents accordingly, increased metformin dose on d/c  2. Pt has labyrinthitis and may need vestibular rehabilitation, will defer to pcp   Discharge Diagnoses:  Active Problems:   Vertigo   Labyrinthitis   Neck pain   Diabetes mellitus without complication (HCC)   Uncontrollable vomiting   Discharge Condition: stable  Diet recommendation:  Carb modified diet  Filed Weights   09/07/15 0644  Weight: 95.8 kg (211 lb 3.2 oz)    History of present illness:  70 y/o with history of DM that presented with n/v/dizziness  Hospital Course:  Labyrinthitis - Will d/c on prednisone taper - Pt reports improvement in condition  DM- uncontrolled - Hgba1c elevated. Patient prefers increasing her metformin and adjusting her diet.  She does not wish to start insulin administration now despite my recommendations to do so based on her hgba1c. As such will increase her metformin dose on d/c  Procedures:  MR Brain  Consultations:  Neurology  Discharge Exam: Filed Vitals:   09/09/15 0600 09/09/15 0900  BP: 167/88 161/77  Pulse: 75 66  Temp: 97.7 F (36.5 C) 98 F (36.7 C)  Resp: 18 18    General: Pt in nad, alert and awake Cardiovascular: rrr, no mrg Respiratory: cta bl, no wheezes  Discharge Instructions   Discharge Instructions    Call MD for:  extreme fatigue    Complete by:  As directed      Call MD for:  temperature >100.4    Complete by:  As directed      Diet - low sodium heart healthy    Complete by:  As directed      Discharge instructions    Complete by:  As directed   Please monitor blood glucose levels and adjust hypoglycemic agents accordingly.     Increase  activity slowly    Complete by:  As directed           Current Discharge Medication List    START taking these medications   Details  meclizine (ANTIVERT) 25 MG tablet Take 1 tablet (25 mg total) by mouth 3 (three) times daily as needed for dizziness. Qty: 15 tablet, Refills: 0      CONTINUE these medications which have CHANGED   Details  metFORMIN (GLUCOPHAGE) 500 MG tablet Take 2 tablets (1,000 mg total) by mouth 2 (two) times daily with a meal. Qty: 60 tablet, Refills: 0      CONTINUE these medications which have NOT CHANGED   Details  ibuprofen (ADVIL,MOTRIN) 200 MG tablet Take 200 mg by mouth every 6 (six) hours as needed for moderate pain.       No Known Allergies Follow-up Information    Follow up with Dudley Major, DO In 1 week.   Specialty:  Neurology   Contact information:   [address] [place] [id number] [phone]        The results of significant diagnostics from this hospitalization (including imaging, microbiology, ancillary and laboratory) are listed below for reference.    Significant Diagnostic Studies: Ct Angio Neck W/cm &/or Wo/cm  09/07/2015  ADDENDUM REPORT: 09/07/2015 09:38 ADDENDUM: I have been asked to review this case. While there is minimal luminal irregularity of the  vertebral arteries, I think it is more likely this is due to atherosclerosis than a grade 1 injury. I think there is actually similar irregularity on the left, and certainly this patient does have diffuse atherosclerosis that could result in this degree of irregularity. I do think the left-sided facet arthropathy at C3-4 could be a cause of chronic neck pain. Electronically Signed   By: Nelson Chimes M.D.   On: 09/07/2015 09:38  09/07/2015  CLINICAL DATA:  Posterior neck pain with extension and dizziness. Assess for vertebral artery stenosis or dissection. History of hypertension, hypercholesterolemia and diabetes. EXAM: CT ANGIOGRAPHY NECK TECHNIQUE:  Multidetector CT imaging of the neck was performed using the standard protocol during bolus administration of intravenous contrast. Multiplanar CT image reconstructions and MIPs were obtained to evaluate the vascular anatomy. Carotid stenosis measurements (when applicable) are obtained utilizing NASCET criteria, using the distal internal carotid diameter as the denominator. CONTRAST:  64mL OMNIPAQUE IOHEXOL 350 MG/ML SOLN COMPARISON:  None. FINDINGS: Aortic arch: Normal appearance of the thoracic arch, normal branch pattern. Mild calcific atherosclerosis of the aortic arch. The origins of the innominate, left Common carotid artery and subclavian artery are widely patent. Right carotid system: Common carotid artery is widely patent, coursing in a straight line fashion. Mild anterior intimal thickening in RIGHT Common carotid artery. Normal appearance of the carotid bifurcation without hemodynamically significant stenosis by NASCET criteria. Normal appearance of the included internal carotid artery. Left carotid system: Common carotid artery is widely patent, coursing in a straight line fashion. Normal appearance of the carotid bifurcation without hemodynamically significant stenosis by NASCET criteria. Normal appearance of the included internal carotid artery. Vertebral arteries:Left vertebral artery is dominant. LEFT V1 and old without dissection. Mild luminal regularity of the RIGHT vertebral artery throughout the V1, V2 and V3 segments. No dissection flap, significant intimal hematoma or pseudo aneurysm. Skeleton: No acute osseous process though bone windows have not been submitted. Grade 1 C3-4 anterolisthesis with mild LEFT facet widening and periarticular sclerosis. Other neck: Soft tissues of the neck are nonacute though, not tailored for evaluation. Small RIGHT hilar calcified lymph nodes. IMPRESSION: Luminal irregularity RIGHT vertebral artery concerning for minimal intimal injury (BC VI grade 1) without  discrete dissection flap or flow limiting stenosis. Mild probable inflammatory changes of LEFT C3-4 facet with grade 1 C3-4 anterolisthesis. Acute findings discussed with and reconfirmed by Dr.DAN FLOYD on 09/07/2015 at 12:43 am. Electronically Signed: By: Elon Alas M.D. On: 09/07/2015 00:43   Mr Brain Wo Contrast  09/07/2015  CLINICAL DATA:  Dizziness beginning yesterday. Chronic neck pain. History of hypertension, hypercholesterolemia and diabetes. Follow-up RIGHT vertebral artery injury. EXAM: MRI HEAD WITHOUT CONTRAST TECHNIQUE: Multiplanar, multiecho pulse sequences of the brain and surrounding structures were obtained without intravenous contrast. COMPARISON:  None. FINDINGS: The ventricles and sulci are normal for patient's age. No abnormal parenchymal signal, mass lesions, mass effect. A few scattered subcentimeter supratentorial white matter FLAIR T2 hyperintensities are less than expected for age. No reduced diffusion to suggest acute ischemia. No susceptibility artifact to suggest hemorrhage. No abnormal extra-axial fluid collections. No extra-axial masses though, contrast enhanced sequences would be more sensitive. Normal major intracranial vascular flow voids seen at the skull base. Ocular globes and orbital contents are unremarkable though not tailored for evaluation. No abnormal sellar expansion. No suspicious calvarial bone marrow signal. Craniocervical junction maintained. RIGHT maxillary mucosal retention cyst, mild paranasal sinus mucosal thickening. Mastoid air cells are well aerated. IMPRESSION: Normal noncontrast MRI of the brain for  age. Electronically Signed   By: Elon Alas M.D.   On: 09/07/2015 03:04    Microbiology: No results found for this or any previous visit (from the past 240 hour(s)).   Labs: Basic Metabolic Panel:  Recent Labs Lab 09/06/15 1806 09/08/15 0213 09/08/15 1704  NA 136 138 135  K 3.7 3.6 4.1  CL 99* 106 101  CO2 22 25 22   GLUCOSE 399*  267* 410*  BUN 15 14 19   CREATININE 0.62 0.48 0.66  CALCIUM 9.5 9.0 9.5   Liver Function Tests:  Recent Labs Lab 09/08/15 0213  AST 16  ALT 13*  ALKPHOS 71  BILITOT 1.1  PROT 6.4*  ALBUMIN 3.0*   No results for input(s): LIPASE, AMYLASE in the last 168 hours. No results for input(s): AMMONIA in the last 168 hours. CBC:  Recent Labs Lab 09/06/15 1806 09/08/15 0213  WBC 5.1 4.4  HGB 12.8 13.2  HCT 37.1 38.9  MCV 85.7 86.1  PLT 301 267   Cardiac Enzymes: No results for input(s): CKTOTAL, CKMB, CKMBINDEX, TROPONINI in the last 168 hours. BNP: BNP (last 3 results) No results for input(s): BNP in the last 8760 hours.  ProBNP (last 3 results) No results for input(s): PROBNP in the last 8760 hours.  CBG:  Recent Labs Lab 09/08/15 1121 09/08/15 1645 09/08/15 1650 09/08/15 2113 09/09/15 0623  GLUCAP 327* 429* 412* 174* 220*     Signed:  Velvet Bathe MD.  [facility] 09/09/2015, 11:22 AM

## 2015-09-09 NOTE — Progress Notes (Signed)
Discharge orders received.  Discharge instructions and follow-up appointments reviewed with the patient and her son.  VSS upon discharge.  IV removed and education complete.  Explained to the son the importance of following up with her PCP to check on her blood sugars.  Walked out to the main entrance. Cori Razor, RN

## 2015-09-13 ENCOUNTER — Emergency Department (HOSPITAL_COMMUNITY): Payer: Self-pay

## 2015-09-13 ENCOUNTER — Encounter (HOSPITAL_COMMUNITY): Payer: Self-pay | Admitting: Emergency Medicine

## 2015-09-13 ENCOUNTER — Emergency Department (HOSPITAL_COMMUNITY)
Admission: EM | Admit: 2015-09-13 | Discharge: 2015-09-13 | Disposition: A | Discharge status: Home / Self Care | Payer: Self-pay | Attending: Emergency Medicine | Admitting: Emergency Medicine

## 2015-09-13 DIAGNOSIS — Z7952 Long term (current) use of systemic steroids: Secondary | ICD-10-CM | POA: Insufficient documentation

## 2015-09-13 DIAGNOSIS — Y998 Other external cause status: Secondary | ICD-10-CM | POA: Insufficient documentation

## 2015-09-13 DIAGNOSIS — T148XXA Other injury of unspecified body region, initial encounter: Secondary | ICD-10-CM

## 2015-09-13 DIAGNOSIS — Z7984 Long term (current) use of oral hypoglycemic drugs: Secondary | ICD-10-CM | POA: Insufficient documentation

## 2015-09-13 DIAGNOSIS — R102 Pelvic and perineal pain: Secondary | ICD-10-CM | POA: Insufficient documentation

## 2015-09-13 DIAGNOSIS — Y939 Activity, unspecified: Secondary | ICD-10-CM | POA: Insufficient documentation

## 2015-09-13 DIAGNOSIS — T148 Other injury of unspecified body region: Secondary | ICD-10-CM | POA: Insufficient documentation

## 2015-09-13 DIAGNOSIS — I10 Essential (primary) hypertension: Secondary | ICD-10-CM | POA: Insufficient documentation

## 2015-09-13 DIAGNOSIS — X58XXXA Exposure to other specified factors, initial encounter: Secondary | ICD-10-CM | POA: Insufficient documentation

## 2015-09-13 DIAGNOSIS — E1165 Type 2 diabetes mellitus with hyperglycemia: Secondary | ICD-10-CM | POA: Insufficient documentation

## 2015-09-13 DIAGNOSIS — R739 Hyperglycemia, unspecified: Secondary | ICD-10-CM

## 2015-09-13 DIAGNOSIS — Y929 Unspecified place or not applicable: Secondary | ICD-10-CM | POA: Insufficient documentation

## 2015-09-13 LAB — URINALYSIS, ROUTINE W REFLEX MICROSCOPIC
Bilirubin Urine: NEGATIVE
Glucose, UA: >1000 mg/dL — AB
Ketones, ur: 40 mg/dL — AB
LEUKOCYTES UA: NEGATIVE
NITRITE: POSITIVE — AB
PH: 6 (ref 5.0–8.0)
Protein, ur: 100 mg/dL — AB
SPECIFIC GRAVITY, URINE: 1.041 — AB (ref 1.005–1.030)

## 2015-09-13 LAB — CBC WITH DIFFERENTIAL/PLATELET
BASOS ABS: 0 10*3/uL (ref 0.0–0.1)
Basophils Relative: 0 %
Eosinophils Absolute: 0 10*3/uL (ref 0.0–0.7)
Eosinophils Relative: 0 %
HEMATOCRIT: 37.1 % (ref 36.0–46.0)
HEMOGLOBIN: 12.7 g/dL (ref 12.0–15.0)
Lymphocytes Relative: 6 %
Lymphs Abs: 0.6 10*3/uL — ABNORMAL LOW (ref 0.7–4.0)
MCH: 29.5 pg (ref 26.0–34.0)
MCHC: 34.2 g/dL (ref 30.0–36.0)
MCV: 86.1 fL (ref 78.0–100.0)
MONOS PCT: 6 %
Monocytes Absolute: 0.6 10*3/uL (ref 0.1–1.0)
NEUTROS ABS: 8.6 10*3/uL — AB (ref 1.7–7.7)
NEUTROS PCT: 88 %
Platelets: 241 10*3/uL (ref 150–400)
RBC: 4.31 MIL/uL (ref 3.87–5.11)
RDW: 13 % (ref 11.5–15.5)
WBC: 9.9 10*3/uL (ref 4.0–10.5)

## 2015-09-13 LAB — BASIC METABOLIC PANEL
ANION GAP: 10 (ref 5–15)
BUN: 16 mg/dL (ref 6–20)
CALCIUM: 8.8 mg/dL — AB (ref 8.9–10.3)
CO2: 25 mmol/L (ref 22–32)
Chloride: 98 mmol/L — ABNORMAL LOW (ref 101–111)
Creatinine, Ser: 0.58 mg/dL (ref 0.44–1.00)
GFR calc non Af Amer: >60 mL/min (ref >60)
Glucose, Bld: 306 mg/dL — ABNORMAL HIGH (ref 65–99)
Potassium: 3.8 mmol/L (ref 3.5–5.1)
Sodium: 133 mmol/L — ABNORMAL LOW (ref 135–145)

## 2015-09-13 LAB — CBG MONITORING, ED: Glucose-Capillary: 296 mg/dL — ABNORMAL HIGH (ref 65–99)

## 2015-09-13 LAB — URINE MICROSCOPIC-ADD ON

## 2015-09-13 MED ORDER — HYDROCODONE-ACETAMINOPHEN 5-325 MG PO TABS
2.0000 | ORAL_TABLET | Freq: Once | ORAL | Status: AC
  Administered 2015-09-13: 2 via ORAL
  Filled 2015-09-13: qty 2

## 2015-09-13 MED ORDER — SODIUM CHLORIDE 0.9 % IV BOLUS (SEPSIS)
1000.0000 mL | Freq: Once | INTRAVENOUS | Status: AC
  Administered 2015-09-13: 1000 mL via INTRAVENOUS

## 2015-09-13 MED ORDER — TRAMADOL HCL 50 MG PO TABS: 50.0000 mg | ORAL_TABLET | Freq: Four times a day (QID) | ORAL | Status: DC | PRN

## 2015-09-13 MED ORDER — SODIUM CHLORIDE 0.9 % IV SOLN
INTRAVENOUS | Status: DC
  Administered 2015-09-13: 15:00:00 via INTRAVENOUS

## 2015-09-13 NOTE — Discharge Instructions (Signed)
Use heat on the sore area 3 or 4 times a day. Drink an extra one and a half to 2 L of water each day. Use the recess guide listed to help you find a primary care doctor.   Hiperglucemia (Hyperglycemia) El nivel elevado de azcar en la sangre (hiperglucemia) significa que es mayor de lo que debera ser. Entre los signos de hiperglucemia se incluyen:  Sensacin de sed.  Orinar con frecuencia.  Sentirse cansado o con sueo.  [employer].  Cambios en la visin.  Sensacin de debilidad.  Sensacin de [place], Armed forces training and education officer con prdida de [place].  Adormecimiento u hormigueo en las manos o en los pies.  Dolor de [place]. Si se ignoran estas seales, el azcar en la sangre puede seguir subiendo. Estos problemas Scientist, research (medical), y otros problemas pueden Medical laboratory scientific officer. CUIDADOS EN EL HOGAR  Controle los niveles de Museum/gallery exhibitions officer segn le haya indicado el mdico. Anote los valores con la fecha y la hora.  Tome la cantidad correcta de insulina o pldoras para la diabetes en el momento adecuado. Anote las dosis con la fecha y Geologist, engineering.  Reponga la insulina o las pldoras para la diabetes antes de que se le acaben.  Controle lo que come. Siga el plan de alimentacin.  Beba lquidos sin azcar, como agua. Verifique con su mdico si tiene trastornos del rin o del corazn.  Siga las instrucciones del mdico para hacer ejercicio. Haga actividad fsica en el mismo momento del da.  Cumpla con las citas mdicas. SOLICITE AYUDA DE INMEDIATO SI:   Presenta dificultades para pensar o se siente confundido.  Tiene respiracin rpida y su aliento tiene [place] a frutas.  Se desmaya.  Lleva 2 a 3 das con [employer] de Dispensing optician y no sabe la razn.  Siente dolor en el pecho.  Tiene Higher education careers adviser (nuseas) y vmitos.  Tiene cambios repentinos en la vista. ASEGRESE DE QUE:   Comprende estas instrucciones.  Controlar su enfermedad.  Solicitar ayuda de inmediato si no mejora o  empeora.   Esta informacin no tiene Marine scientist el consejo del mdico. Asegrese de hacerle al mdico cualquier pregunta que tenga.   Document Released: 07/28/2010 Document Revised: 07/16/2014 Elsevier Interactive Patient Education 2016 [place] plvico, mujeres (Pelvic Pain, Female)  Las causa del dolor plvico en la mujer pueden ser muchas y pueden tener su origen en diferentes lugares. El dolor plvico es el que aparece en la mitad inferior del abdomen y [place] caderas. Puede aparecer durante en un perodo corto de tiempo (agudo)o puede ser recurrente (crnico). Esta afeccin puede estar relacionada con trastornos que afectan a los rganos reproductivos femeninos (ginecolgica), pero tambin puede deberse a problemas en la vejiga, clculos renales, complicaciones intestinales, o problemas musculares o esquelticos. Es Materials engineer ayuda de inmediato, sobre todo si ha sido intenso, [place], o ha aparecido de Geographical information systems officer sbita como un dolor inusual. Tambin es importante obtener ayuda de inmediato, ya que algunos tipos de dolor plvico puede poner en peligro la vida.  CAUSAS  A continuacin veremos algunas de las causas del dolor plvico. Las causas pueden clasificarse de diferentes modos.   Ginecolgica.  Enfermedad inflamatoria plvica.  Infecciones de transmisin sexual.  Quiste de ovario o torsin de un ligamento ovrico ( torsin ovrica).  La membrana que recubre internamente al tero desarrollndose fuera del tero (endometriosis).  Fibromas, quistes o tumores.  Ovulacin.  Embarazo.  Embarazo fuera del tero (embarazo ectpico).  Aborto espontneo.  Trabajo de [place].  Desprendimiento de la placenta o ruptura del tero.  Infecciones.  Infeccin uterina (endometritis).  Infeccin de la vejiga.  Diverticulitis.  Aborto relacionado con una infeccin uterina (aborto sptico).  Vejiga.  Inflamacin de la vejiga (cistitis).  Clculos  renales.  Gastrointenstinal.  Estreimiento.  Diverticulitis.  Neurolgico.  Traumatismos.  Sentir dolor plvico debido a causas mentales o emocionales (psicosomtico).  Tumores en el intestino o en la pelvis. EVALUACIN  El mdico har una historia clnica detallada segn sus sntomas. Incluir los cambios recientes en su salud, una cuidadosa historia ginecolgica de sus periodos (menstruaciones) y Ardelia Mems historia de su actividad sexual. Los antecedentes familiares y la historia clnica tambin son importantes. Su mdico podr indicar un examen plvico. El examen plvico ayudar a identificar la ubicacin y la gravedad del Social research officer, government. Tambin ayudar a [employer] rganos que pueden estar involucrados. Romie Minus identificar la causa del dolor plvico y tratarlo adecuadamente, el mdico puede indicar estudios. Estas pruebas pueden ser:   Test de embarazo.  Ecografa plvica.  Radiografa del abdomen.  Un anlisis de [place] o la evaluacin de la secrecin vaginal.  Anlisis de [place]. INSTRUCCIONES PARA EL CUIDADO EN EL HOGAR   Solo tome medicamentos de venta libre o recetados para Conservation officer, historic buildings, Tree surgeon o fiebre, segn las indicaciones del mdico.   Haga reposo segn las indicaciones del mdico.   Consuma una dieta balanceada.   Beba gran cantidad de lquido para mantener la orina de tono claro o amarillo plido.   Evite las relaciones sexuales, Higher education careers adviser.   Aplique compresas calientes o fras en la zona baja del abdomen segn cual le calme el dolor.   Evite las situaciones estresantes.   Lleve un registro del dolor plvico. Anote cundo comenz, dnde se Midwife y si hay cosas que parecen estar asociadas con el dolor, como algn alimento o su ciclo menstrual.  Concurra a las visitas de control con el mdico, segn las indicaciones.  SOLICITE ATENCIN MDICA SI:   Los medicamentos no Buyer, retail.  Tiene flujo vaginal anormal. SOLICITE ATENCIN  MDICA DE INMEDIATO SI:   Tiene un sangrado abundante por la vagina.   El dolor plvico aumenta.   Se siente mareada o sufre un desmayo.   Siente escalofros.   Siente dolor intenso al Garment/textile technologist u observa sangre en la orina.   Thomas que no puede controlar.   Tiene fiebre o sntomas que persisten durante ms de 3 das.  Tiene fiebre y los sntomas empeoran.   Ha sido abusada fsica o sexualmente.  ASEGRESE DE QUE:   Comprende estas instrucciones.  Controlar su enfermedad.  Solicitar ayuda de inmediato si no mejora o si empeora.   Esta informacin no tiene Marine scientist el consejo del mdico. Asegrese de hacerle al mdico cualquier pregunta que tenga.   Document Released: 09/21/2008 Document Revised: 11/09/2014 Elsevier Interactive Patient Education [employer].

## 2015-09-13 NOTE — ED Provider Notes (Signed)
CSN: [id number]     Arrival date & time 09/13/15  0807 History   First MD Initiated Contact with Patient 09/13/15 [phone]     Chief Complaint  Patient presents with  . Hip Pain     (Consider location/radiation/quality/duration/timing/severity/associated sxs/prior Treatment) The history is provided by the patient and a relative.   Emily Hoover is a 70 y.o. female who presents for evaluation of left upper leg and groin pain, present for 3 days, without trauma. No similar prior problem. She was recently hospitalized, and started on metformin for diabetes. She denies fever, chills, nausea, vomiting, weakness or dizziness. There are no other known modifying factors.  Past Medical History  Diagnosis Date  . Diabetes mellitus   . Hypertension    History reviewed. No pertinent past surgical history. History reviewed. No pertinent family history. Social History  Substance Use Topics  . Smoking status: Never Smoker   . Smokeless tobacco: None  . Alcohol Use: None   OB History    No data available     Review of Systems  All other systems reviewed and are negative.     Allergies  Review of patient's allergies indicates no known allergies.  Home Medications   Prior to Admission medications   Medication Sig Start Date End Date Taking? Authorizing Provider  metFORMIN (GLUCOPHAGE) 500 MG tablet TAKE ONE TABLET BY MOUTH TWICE DAILY WITH FOOD (NEEDS OFFICE VISIT FOR REFILLS" Patient taking differently: Take 1,000 mg by mouth 2 (two) times daily with a meal. TAKE ONE TABLET BY MOUTH TWICE DAILY WITH FOOD (NEEDS OFFICE VISIT FOR REFILLS" 04/28/15  Yes Jaynee Eagles, PA-C  predniSONE (DELTASONE) 10 MG tablet Take 10 mg by mouth daily with breakfast.   Yes Historical Provider, MD  glimepiride (AMARYL) 2 MG tablet Take 1 tablet (2 mg total) by mouth daily. NO MORE REFILLS WITHOUT OFFICE VISIT - 2ND NOTICE Patient not taking: Reported on 09/13/2015 03/03/14   Harrison Mons, PA-C  traMADol (ULTRAM) 50  MG tablet Take 1 tablet (50 mg total) by mouth every 6 (six) hours as needed. 09/13/15   Daleen Bo, MD   BP 120/60 mmHg  Pulse 85  Temp(Src) 98.7 F (37.1 C)  Resp 16  SpO2 98% Physical Exam  Constitutional: She is oriented to person, place, and time. She appears well-developed.  Frail, elderly  HENT:  Head: Normocephalic and atraumatic.  Right Ear: External ear normal.  Left Ear: External ear normal.  Eyes: Conjunctivae and EOM are normal. Pupils are equal, round, and reactive to light.  Neck: Normal range of motion and phonation normal. Neck supple.  Cardiovascular: Normal rate, regular rhythm and normal heart sounds.   Pulmonary/Chest: Effort normal and breath sounds normal. She exhibits no bony tenderness.  Abdominal: Soft. There is no tenderness.  Musculoskeletal: She exhibits no tenderness.  She resists movement of the left leg, both passively and actively secondary to left groin pain. There is no deformity of the left hip, knee or ankle. There is pain in the left hip with attempts at sitting. No abnormal motion of the arms or right leg.  Neurological: She is alert and oriented to person, place, and time. No cranial nerve deficit or sensory deficit. She exhibits normal muscle tone. Coordination normal.  Skin: Skin is warm, dry and intact.  Psychiatric: She has a normal mood and affect. Her behavior is normal. Judgment and thought content normal.  Nursing note and vitals reviewed.   ED Course  Procedures (including critical care time)  Medications  0.9 %  sodium chloride infusion ( Intravenous New Bag/Given 09/13/15 1500)  HYDROcodone-acetaminophen (NORCO/VICODIN) 5-325 MG per tablet 2 tablet (2 tablets Oral Given 09/13/15 1007)  sodium chloride 0.9 % bolus 1,000 mL (1,000 mLs Intravenous New Bag/Given 09/13/15 1459)    Patient Vitals for the past 24 hrs:  BP Temp Pulse Resp SpO2  09/13/15 1600 120/60 mmHg - 85 16 98 %  09/13/15 1530 117/66 mmHg - 86 - 98 %  09/13/15 1332  132/81 mmHg - 94 14 95 %  09/13/15 1145 136/79 mmHg - 101 - 95 %  09/13/15 0836 141/75 mmHg 98.7 F (37.1 C) 108 18 99 %    4:49 PM Reevaluation with update and discussion. After initial assessment and treatment, an updated evaluation reveals She is comfortable now. Findings discussed with patient, and family members, all questions answered. [place] Review Labs Reviewed  BASIC METABOLIC PANEL - Abnormal; Notable for the following:    Sodium 133 (*)    Chloride 98 (*)    Glucose, Bld 306 (*)    Calcium 8.8 (*)    All other components within normal limits  CBC WITH DIFFERENTIAL/PLATELET - Abnormal; Notable for the following:    Neutro Abs 8.6 (*)    Lymphs Abs 0.6 (*)    All other components within normal limits  URINALYSIS, ROUTINE W REFLEX MICROSCOPIC (NOT AT [facility]) - Abnormal; Notable for the following:    Color, Urine AMBER (*)    APPearance CLOUDY (*)    Specific Gravity, Urine 1.041 (*)    Glucose, UA >1000 (*)    Hgb urine dipstick LARGE (*)    Ketones, ur 40 (*)    Protein, ur 100 (*)    Nitrite POSITIVE (*)    All other components within normal limits  URINE MICROSCOPIC-ADD ON - Abnormal; Notable for the following:    Squamous Epithelial / LPF 0-5 (*)    Bacteria, UA MANY (*)    All other components within normal limits  CBG MONITORING, ED - Abnormal; Notable for the following:    Glucose-Capillary 296 (*)    All other components within normal limits  URINE CULTURE    Imaging Review Mr Pelvis Wo Contrast  09/13/2015  CLINICAL DATA:  Left hip pain for 3 days with no recent injury. EXAM: MRI PELVIS WITHOUT CONTRAST TECHNIQUE: Multiplanar multisequence MR imaging of the pelvis was performed. No intravenous contrast was administered. COMPARISON:  09/13/2015 radiograph FINDINGS: The the there is abnormal edema in the left iliopsoas muscle and also in the left hip adductor musculature diffusely. No discrete operator or sciatic notch impingement. There is also  some low-level edema in the anterior vastus musculature proximally on the left. Lower lumbar spondylosis and degenerative disc disease. Sacroiliac joints unremarkable. No significant abnormal marrow edema in the bony pelvis is observed to suggest a fracture or stress fracture. Trace left trochanteric bursitis. Borderline appearance for left hip joint effusion. Proximal hamstring tendons unremarkable. Anterolisthesis at L5-S1 due to pars defects. These were present in 2011 and hence considered chronic. IMPRESSION: 1. Unusual appearance of abnormal muscular edema involving the iliopsoas, left hip adductor musculature, and proximal left vastus musculature, but without underlying bony abnormality identified. No definite obturator impingement. Correlate with any fever/leukocytosis in assessing for an infectious myositis or fasciitis. There is no gas or abscess, at and only a borderline left hip joint effusion. A neurologic or vascular cause seems less likely giving the differing nerve and arterial supply  is to the iliacus and hip adductor musculature, but given the patient's history of diabetes, careful management is suggested. 2. 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 Electronically Signed   By: Van Clines M.D.   On: 09/13/2015 14:14   Dg Hip Unilat With Pelvis 2-3 Views Left  09/13/2015  CLINICAL DATA:  Worsening left hip pain over the last 3 days. No known injury. EXAM: DG HIP (WITH OR WITHOUT PELVIS) 2-3V LEFT COMPARISON:  Pelvic radiographs 12/13/2009. FINDINGS: There is progressive osteopenia. No evidence of proximal left femur fracture, dislocation or femoral head avascular necrosis. Best seen on the AP view of the left hip is suggested lucency involving the left superior pubic ramus, potentially reflecting a stress fracture. No displaced fracture identified. The inferior pubic ramus appears normal. The sacroiliac joints are intact. IMPRESSION: Generalized osteopenia with  potential stress fracture of the left superior pubic ramus. The proximal left femur appears intact. Electronically Signed   By: Richardean Sale M.D.   On: 09/13/2015 09:40   I have personally reviewed and evaluated these images and lab results as part of my medical decision-making.   EKG Interpretation   Date/Time:  Tuesday September 13 2015 08:45:58 EST Ventricular Rate:  112 PR Interval:  134 QRS Duration: 62 QT Interval:  290 QTC Calculation: 395 R Axis:   1 Text Interpretation:  Sinus tachycardia Nonspecific ST and T wave  abnormality Abnormal ECG Since last tracing rate faster Confirmed by Nicolis Boody   MD, Ataya Murdy [id number]) on 09/13/2015 9:37:06 AM      MDM   Final diagnoses:  Pelvic pain in female  Hyperglycemia  Muscle strain    Nonspecific left leg, and groin pain. Most likely related to muscle strain, without evidence for myositis, fracture or lumbar radiculopathy. Possible mild dehydration associated with hyperglycemia, which was recently begun to be treated. No evidence for DKA, or metabolic instability.  Nursing Notes Reviewed/ Care Coordinated Applicable Imaging Reviewed Interpretation of Laboratory Data incorporated into ED treatment  The patient appears reasonably screened and/or stabilized for discharge and I doubt any other medical condition or other [facility] requiring further screening, evaluation, or treatment in the ED at this time prior to discharge.  Plan: Home Medications- Tramadol; Home Treatments- heat; return here if the recommended treatment, does not improve the symptoms; Recommended follow up- PCP 1 week and prn     Daleen Bo, MD 09/13/15 1651

## 2015-09-13 NOTE — ED Notes (Signed)
Pt here with c/o left hip pain that started three days ago , pt had been on prednisone and has just finished it , pt states no trauma to hip

## 2015-09-13 NOTE — ED Notes (Signed)
Pt left for MRI.

## 2015-09-16 ENCOUNTER — Inpatient Hospital Stay (HOSPITAL_COMMUNITY)
Admission: EM | Admit: 2015-09-16 | Discharge: 2015-09-22 | DRG: 558 | Disposition: A | Discharge status: Home Health | Payer: Self-pay | Attending: Internal Medicine | Admitting: Internal Medicine

## 2015-09-16 ENCOUNTER — Emergency Department (HOSPITAL_COMMUNITY): Payer: Self-pay

## 2015-09-16 ENCOUNTER — Encounter (HOSPITAL_COMMUNITY): Payer: Self-pay | Admitting: Emergency Medicine

## 2015-09-16 DIAGNOSIS — E876 Hypokalemia: Secondary | ICD-10-CM | POA: Diagnosis present

## 2015-09-16 DIAGNOSIS — M6008 Infective myositis, other site: Principal | ICD-10-CM | POA: Diagnosis present

## 2015-09-16 DIAGNOSIS — R319 Hematuria, unspecified: Secondary | ICD-10-CM

## 2015-09-16 DIAGNOSIS — B9562 Methicillin resistant Staphylococcus aureus infection as the cause of diseases classified elsewhere: Secondary | ICD-10-CM | POA: Diagnosis present

## 2015-09-16 DIAGNOSIS — I10 Essential (primary) hypertension: Secondary | ICD-10-CM | POA: Diagnosis present

## 2015-09-16 DIAGNOSIS — R52 Pain, unspecified: Secondary | ICD-10-CM

## 2015-09-16 DIAGNOSIS — N390 Urinary tract infection, site not specified: Secondary | ICD-10-CM

## 2015-09-16 DIAGNOSIS — Z7984 Long term (current) use of oral hypoglycemic drugs: Secondary | ICD-10-CM

## 2015-09-16 DIAGNOSIS — M25552 Pain in left hip: Secondary | ICD-10-CM

## 2015-09-16 DIAGNOSIS — Z5189 Encounter for other specified aftercare: Secondary | ICD-10-CM

## 2015-09-16 DIAGNOSIS — R1032 Left lower quadrant pain: Secondary | ICD-10-CM

## 2015-09-16 DIAGNOSIS — Z6823 Body mass index (BMI) 23.0-23.9, adult: Secondary | ICD-10-CM

## 2015-09-16 DIAGNOSIS — Z7952 Long term (current) use of systemic steroids: Secondary | ICD-10-CM

## 2015-09-16 DIAGNOSIS — D649 Anemia, unspecified: Secondary | ICD-10-CM | POA: Diagnosis present

## 2015-09-16 DIAGNOSIS — Z23 Encounter for immunization: Secondary | ICD-10-CM

## 2015-09-16 DIAGNOSIS — E119 Type 2 diabetes mellitus without complications: Secondary | ICD-10-CM

## 2015-09-16 DIAGNOSIS — E440 Moderate protein-calorie malnutrition: Secondary | ICD-10-CM | POA: Diagnosis present

## 2015-09-16 DIAGNOSIS — R7881 Bacteremia: Secondary | ICD-10-CM | POA: Diagnosis present

## 2015-09-16 DIAGNOSIS — E1165 Type 2 diabetes mellitus with hyperglycemia: Secondary | ICD-10-CM | POA: Diagnosis present

## 2015-09-16 DIAGNOSIS — M25559 Pain in unspecified hip: Secondary | ICD-10-CM | POA: Diagnosis present

## 2015-09-16 LAB — COMPREHENSIVE METABOLIC PANEL
ALBUMIN: 2.6 g/dL — AB (ref 3.5–5.0)
ALK PHOS: 113 U/L (ref 38–126)
ALT: 17 U/L (ref 14–54)
ANION GAP: 13 (ref 5–15)
AST: 15 U/L (ref 15–41)
BILIRUBIN TOTAL: 1 mg/dL (ref 0.3–1.2)
BUN: 13 mg/dL (ref 6–20)
CALCIUM: 8.9 mg/dL (ref 8.9–10.3)
CO2: 22 mmol/L (ref 22–32)
Chloride: 95 mmol/L — ABNORMAL LOW (ref 101–111)
Creatinine, Ser: 0.57 mg/dL (ref 0.44–1.00)
GFR calc Af Amer: >60 mL/min (ref >60)
GLUCOSE: 301 mg/dL — AB (ref 65–99)
POTASSIUM: 3.8 mmol/L (ref 3.5–5.1)
Sodium: 130 mmol/L — ABNORMAL LOW (ref 135–145)
TOTAL PROTEIN: 6.5 g/dL (ref 6.5–8.1)

## 2015-09-16 LAB — URINALYSIS, ROUTINE W REFLEX MICROSCOPIC
Nitrite: POSITIVE — AB
PH: 6 (ref 5.0–8.0)
Specific Gravity, Urine: 1.03 (ref 1.005–1.030)

## 2015-09-16 LAB — URINE MICROSCOPIC-ADD ON

## 2015-09-16 LAB — SEDIMENTATION RATE: SED RATE: 88 mm/h — AB (ref 0–22)

## 2015-09-16 LAB — CBC
HCT: 35.7 % — ABNORMAL LOW (ref 36.0–46.0)
HEMOGLOBIN: 12.4 g/dL (ref 12.0–15.0)
MCH: 29.2 pg (ref 26.0–34.0)
MCHC: 34.7 g/dL (ref 30.0–36.0)
MCV: 84.2 fL (ref 78.0–100.0)
PLATELETS: 294 10*3/uL (ref 150–400)
RBC: 4.24 MIL/uL (ref 3.87–5.11)
RDW: 13 % (ref 11.5–15.5)
WBC: 13.1 10*3/uL — AB (ref 4.0–10.5)

## 2015-09-16 LAB — C-REACTIVE PROTEIN: CRP: 27.1 mg/dL — AB (ref <1.0)

## 2015-09-16 LAB — URINE CULTURE: Special Requests: NORMAL

## 2015-09-16 LAB — I-STAT CG4 LACTIC ACID, ED
LACTIC ACID, VENOUS: 1.8 mmol/L (ref 0.5–2.0)
LACTIC ACID, VENOUS: 1.28 mmol/L (ref 0.5–2.0)

## 2015-09-16 LAB — CK: CK TOTAL: 30 U/L — AB (ref 38–234)

## 2015-09-16 LAB — CBG MONITORING, ED
GLUCOSE-CAPILLARY: 279 mg/dL — AB (ref 65–99)
GLUCOSE-CAPILLARY: 277 mg/dL — AB (ref 65–99)

## 2015-09-16 LAB — LIPASE, BLOOD: Lipase: 15 U/L (ref 11–51)

## 2015-09-16 MED ORDER — DEXTROSE 5 % IV SOLN
1.0000 g | Freq: Once | INTRAVENOUS | Status: AC
  Administered 2015-09-16: 1 g via INTRAVENOUS
  Filled 2015-09-16: qty 10

## 2015-09-16 MED ORDER — NAPROXEN 250 MG PO TABS
500.0000 mg | ORAL_TABLET | Freq: Once | ORAL | Status: AC
  Administered 2015-09-16: 500 mg via ORAL
  Filled 2015-09-16: qty 2

## 2015-09-16 MED ORDER — SODIUM CHLORIDE 0.9 % IV BOLUS (SEPSIS)
1000.0000 mL | Freq: Once | INTRAVENOUS | Status: AC
  Administered 2015-09-16: 1000 mL via INTRAVENOUS

## 2015-09-16 MED ORDER — HYDROCODONE-ACETAMINOPHEN 5-325 MG PO TABS
1.0000 | ORAL_TABLET | Freq: Once | ORAL | Status: AC
  Administered 2015-09-16: 1 via ORAL
  Filled 2015-09-16: qty 1

## 2015-09-16 NOTE — ED Notes (Signed)
Pt from home with worsening left hip pain.  Pt son reports since she started taking medications prescribed on 09/13/15 that her urine has had increased blood.  Family reports overall weakness is the same as when seen here a few days ago.  NAD, A&O.

## 2015-09-16 NOTE — ED Provider Notes (Signed)
CSN: [id number]     Arrival date & time 09/16/15  1459 History   First MD Initiated Contact with Patient 09/16/15 1812     Chief Complaint  Patient presents with  . Hip Pain  . Hematuria     (Consider location/radiation/quality/duration/timing/severity/associated sxs/prior Treatment) HPI Comments: Pt comes in with cc of difficulty walking. Pt has hx of DM. Son is helping with the translation. Pt has been having difficulty walking the last 5 days. Pt is having pain in her groin. She came to the ER with the complain few days back, had MRI that showed inflammatory changes in her groin area, but no clear infection or significant hip effusion, avascular hip seen. Pt unable to ambulate on her own, she was walking normally few days back. Pt also c/o bloody urine, frequent urination. No n/v/f/c.   Patient is a 70 y.o. female presenting with hip pain and hematuria. The history is provided by the patient.  Hip Pain  Hematuria    Past Medical History  Diagnosis Date  . Diabetes mellitus   . Hypertension    History reviewed. No pertinent past surgical history. History reviewed. No pertinent family history. Social History  Substance Use Topics  . Smoking status: Never Smoker   . Smokeless tobacco: None  . Alcohol Use: None   OB History    No data available     Review of Systems  Genitourinary: Positive for hematuria.  All other systems reviewed and are negative.     Allergies  Review of patient's allergies indicates no known allergies.  Home Medications   Prior to Admission medications   Medication Sig Start Date End Date Taking? Authorizing Provider  metFORMIN (GLUCOPHAGE) 500 MG tablet TAKE ONE TABLET BY MOUTH TWICE DAILY WITH FOOD (NEEDS OFFICE VISIT FOR REFILLS" Patient taking differently: Take 1,000 mg by mouth 2 (two) times daily with a meal. TAKE ONE TABLET BY MOUTH TWICE DAILY WITH FOOD (NEEDS OFFICE VISIT FOR REFILLS" 04/28/15  Yes Jaynee Eagles, PA-C  predniSONE  (DELTASONE) 10 MG tablet Take 10 mg by mouth daily with breakfast.   Yes Historical Provider, MD  traMADol (ULTRAM) 50 MG tablet Take 1 tablet (50 mg total) by mouth every 6 (six) hours as needed. 09/13/15  Yes Daleen Bo, MD  glimepiride (AMARYL) 2 MG tablet Take 1 tablet (2 mg total) by mouth daily. NO MORE REFILLS WITHOUT OFFICE VISIT - 2ND NOTICE Patient not taking: Reported on 09/13/2015 03/03/14   Chelle Jeffery, PA-C   BP 142/70 mmHg  Pulse 98  Temp(Src) 99.8 F (37.7 C) (Oral)  Resp 16  SpO2 96% Physical Exam  Constitutional: She is oriented to person, place, and time. She appears well-developed.  HENT:  Head: Normocephalic and atraumatic.  Eyes: EOM are normal.  Neck: Normal range of motion. Neck supple.  Cardiovascular: Normal rate and intact distal pulses.   Pulmonary/Chest: Effort normal.  Abdominal: Bowel sounds are normal.  Musculoskeletal:  LLE: Pt unable to lift her L hip actively. Pt has increased warmth in the L hip compared to the R side. She has no significant swelling. Pt has tenderness to palpation in the groin area - proximal femur region. With passive ROM there is severe tenderness, although no stiffness appreciated. Pt not tolerating internal or external rotation of the hip.   Neurological: She is alert and oriented to person, place, and time.  Skin: Skin is warm and dry. No rash noted.  Nursing note and vitals reviewed.   ED Course  Procedures (  including critical care time) Labs Review Labs Reviewed  URINALYSIS, ROUTINE W REFLEX MICROSCOPIC (NOT AT [facility]) - Abnormal; Notable for the following:    Color, Urine RED (*)    APPearance TURBID (*)    Glucose, UA >1000 (*)    Hgb urine dipstick LARGE (*)    Bilirubin Urine MODERATE (*)    Ketones, ur >80 (*)    Protein, ur >300 (*)    Nitrite POSITIVE (*)    Leukocytes, UA LARGE (*)    All other components within normal limits  COMPREHENSIVE METABOLIC PANEL - Abnormal; Notable for the following:     Sodium 130 (*)    Chloride 95 (*)    Glucose, Bld 301 (*)    Albumin 2.6 (*)    All other components within normal limits  CBC - Abnormal; Notable for the following:    WBC 13.1 (*)    HCT 35.7 (*)    All other components within normal limits  URINE MICROSCOPIC-ADD ON - Abnormal; Notable for the following:    Squamous Epithelial / LPF 0-5 (*)    Bacteria, UA MANY (*)    All other components within normal limits  C-REACTIVE PROTEIN - Abnormal; Notable for the following:    CRP 27.1 (*)    All other components within normal limits  CK - Abnormal; Notable for the following:    Total CK 30 (*)    All other components within normal limits  SEDIMENTATION RATE - Abnormal; Notable for the following:    Sed Rate 88 (*)    All other components within normal limits  CBG MONITORING, ED - Abnormal; Notable for the following:    Glucose-Capillary 279 (*)    All other components within normal limits  URINE CULTURE  LIPASE, BLOOD  I-STAT CG4 LACTIC ACID, ED  I-STAT CG4 LACTIC ACID, ED    Imaging Review No results found. I have personally reviewed and evaluated these images and lab results as part of my medical decision-making.   EKG Interpretation None      MDM   Final diagnoses:  Hematuria  UTI (lower urinary tract infection)    Pt comes in with UTI like symptoms - UA is dirty, will treat the urine, cultures sent. She also has groin pain, mechanical in nature and reproducible with palpation. Initial concerns are for septic joint, tenosynovitis, bursitis of the hip, avascular necrosis. Pt came in with the same complaint 3 days ago and had a neg MRI hip at that time- making the pretest probability for septic joint, deep tissue infection less likely. She has non specific edema of the groin muscles, and i am not sure what to make of that. We will still get sed rate and crp - if both really elevated, she might need IR guided arthrocenthesis to entirely r/o hip infection.  9:27  PM Hospitalist admitting as pt unable to walk on her own. She will need PT/OT. Dr. Roel Cluck thinks CT renal stone protocol might help, as she has gross hematuria and the groin pain.     Varney Biles, MD 09/16/15 2129

## 2015-09-16 NOTE — ED Notes (Signed)
Patient transported to CT scan . 

## 2015-09-16 NOTE — H&P (Signed)
PCP: No primary care provider on file.    Referring provider Nanavati   Chief Complaint:    HPI: Emily Hoover is a 70 y.o. female   has a past medical history of Diabetes mellitus and Hypertension.   Presented with  He should have been having left hip pain for past week she was treated with prednisone and has finished that He was seen in emergency department for this on March 7. MRI of the pelvis is done and showed unusual appearance of abnormal muscular edema in involving iliopsoas left hip abductor musculature and proximal left vastus musculature no bony abnormality. No evidence of abscess at that time a she was able to be discharged to home.  Event back today again with CVA left hip pain and unable to ambulate she's been having worsening hematuria generalized fatigue. Has not been able to ambulate without significant assist. Denies fever, but had some chills, some diarrhea, pain with urination, She does not have any way to check Blood glucose at home. No chest pain no back pain  IN ER: UA was found to be significant for UTI lactic acid 1.8 glucose 301 sodium 137, elevated 13.1 Sedimentation rate 88, CRP 27.1  Regarding pertinent past history: She is history of diabetes for the past 4 years on metformin recently been treated with steroids with worsening blood sugar management. CT of abdomen and pelvis was ordered  Hospitalist was called for admission for left hip pain and UTI  Review of Systems:    Pertinent positives include: fatigue, left hip pain, chills,  pain with urination  Constitutional:  No weight loss, night sweats, Fevers, weight loss  HEENT:  No headaches, Difficulty swallowing,Tooth/dental problems, Sore throat,  No sneezing, itching, ear ache, nasal congestion, post nasal drip,  Cardio-vascular:  No chest pain, Orthopnea, PND, anasarca, dizziness, palpitations.no Bilateral lower extremity swelling  GI:  No heartburn, indigestion, abdominal pain, nausea,  vomiting, diarrhea, change in bowel habits, loss of appetite, melena, blood in stool, hematemesis Resp:  no shortness of breath at rest. No dyspnea on exertion, No excess mucus, no productive cough, No non-productive cough, No coughing up of blood.No change in color of mucus.No wheezing. Skin:  no rash or lesions. No jaundice GU:  no dysuria, change in color of urine, no urgency or frequency. No straining to urinate.    Musculoskeletal:  No joint pain or no joint swelling. No decreased range of motion. No back pain.  Psych:  No change in mood or affect. No depression or anxiety. No memory loss.  Neuro: no localizing neurological complaints, no tingling, no weakness, no double vision, no gait abnormality, no slurred speech, no confusion  Otherwise ROS are negative except for above, 10 systems were reviewed  Past Medical History: Past Medical History  Diagnosis Date  . Diabetes mellitus   . Hypertension    History reviewed. No pertinent past surgical history.   Medications: Prior to Admission medications   Medication Sig Start Date End Date Taking? Authorizing Provider  metFORMIN (GLUCOPHAGE) 500 MG tablet TAKE ONE TABLET BY MOUTH TWICE DAILY WITH FOOD (NEEDS OFFICE VISIT FOR REFILLS" Patient taking differently: Take 1,000 mg by mouth 2 (two) times daily with a meal. TAKE ONE TABLET BY MOUTH TWICE DAILY WITH FOOD (NEEDS OFFICE VISIT FOR REFILLS" 04/28/15  Yes Jaynee Eagles, PA-C  predniSONE (DELTASONE) 10 MG tablet Take 10 mg by mouth daily with breakfast.   Yes Historical Provider, MD  traMADol (ULTRAM) 50 MG tablet Take 1 tablet (50 mg  total) by mouth every 6 (six) hours as needed. 09/13/15  Yes Daleen Bo, MD  glimepiride (AMARYL) 2 MG tablet Take 1 tablet (2 mg total) by mouth daily. NO MORE REFILLS WITHOUT OFFICE VISIT - 2ND NOTICE Patient not taking: Reported on 09/13/2015 03/03/14   Harrison Mons, PA-C    Allergies:  No Known Allergies  Social History:  Ambulatory   Independently  Lives at home  With family son     reports that she has never smoked. She does not have any smokeless tobacco history on file. She reports that she does not use illicit drugs.     Family History: family history includes CAD in her father and mother; Hypertension in her father. There is no history of Diabetes or Cancer.    Physical Exam: Patient Vitals for the past 24 hrs:  BP Temp Temp src Pulse Resp SpO2  09/16/15 2029 142/70 mmHg - - 98 16 96 %  09/16/15 1925 152/78 mmHg - - 96 16 97 %  09/16/15 1915 157/79 mmHg - - 98 - 97 %  09/16/15 1900 155/81 mmHg - - 108 - 95 %  09/16/15 1845 154/77 mmHg - - 100 - 95 %  09/16/15 1830 155/75 mmHg - - 103 - 96 %  09/16/15 1815 152/78 mmHg - - 104 - 96 %  09/16/15 1726 131/69 mmHg 99.8 F (37.7 C) Oral 115 22 100 %  09/16/15 1604 144/69 mmHg 98.6 F (37 C) Oral 115 - 98 %    1. General:  in No Acute distress 2. Psychological: Alert and   Oriented 3. Head/ENT:    Dry Mucous Membranes                          Head Non traumatic, neck supple                          Normal  Dentition 4. SKIN:   decreased Skin turgor,  Skin clean Dry and intact no rash 5. Heart: Regular rate and rhythm no Murmur, Rub or gallop 6. Lungs:  Clear to auscultation bilaterally, no wheezes or crackles   7. Abdomen: Soft, non-tender, Non distended 8. Lower extremities: no clubbing, cyanosis, or edema 9. Neurologically Grossly intact, moving all 4 extremities equally 10. MSK: Normal range of motion, except for limited in left hip secondary to severe pain, to palpation of left anterior inguinal area  body mass index is unknown because there is no weight on file.   Labs on Admission:   Results for orders placed or performed during the hospital encounter of 09/16/15 (from the past 24 hour(s))  CBG monitoring, ED     Status: Abnormal   Collection Time: 09/16/15  4:13 PM  Result Value Ref Range   Glucose-Capillary 279 (H) 65 - 99 mg/dL  Lipase,  blood     Status: None   Collection Time: 09/16/15  4:26 PM  Result Value Ref Range   Lipase 15 11 - 51 U/L  Comprehensive metabolic panel     Status: Abnormal   Collection Time: 09/16/15  4:26 PM  Result Value Ref Range   Sodium 130 (L) 135 - 145 mmol/L   Potassium 3.8 3.5 - 5.1 mmol/L   Chloride 95 (L) 101 - 111 mmol/L   CO2 22 22 - 32 mmol/L   Glucose, Bld 301 (H) 65 - 99 mg/dL   BUN 13 6 - 20 mg/dL  Creatinine, Ser 0.57 0.44 - 1.00 mg/dL   Calcium 8.9 8.9 - 10.3 mg/dL   Total Protein 6.5 6.5 - 8.1 g/dL   Albumin 2.6 (L) 3.5 - 5.0 g/dL   AST 15 15 - 41 U/L   ALT 17 14 - 54 U/L   Alkaline Phosphatase 113 38 - 126 U/L   Total Bilirubin 1.0 0.3 - 1.2 mg/dL   GFR calc non Af Amer >60 >60 mL/min   GFR calc Af Amer >60 >60 mL/min   Anion gap 13 5 - 15  CBC     Status: Abnormal   Collection Time: 09/16/15  4:26 PM  Result Value Ref Range   WBC 13.1 (H) 4.0 - 10.5 K/uL   RBC 4.24 3.87 - 5.11 MIL/uL   Hemoglobin 12.4 12.0 - 15.0 g/dL   HCT 35.7 (L) 36.0 - 46.0 %   MCV 84.2 78.0 - 100.0 fL   MCH 29.2 26.0 - 34.0 pg   MCHC 34.7 30.0 - 36.0 g/dL   RDW 13.0 11.5 - 15.5 %   Platelets 294 150 - 400 K/uL  Urinalysis, Routine w reflex microscopic-may I&O cath if menses (not at [facility])     Status: Abnormal   Collection Time: 09/16/15  4:37 PM  Result Value Ref Range   Color, Urine RED (A) YELLOW   APPearance TURBID (A) CLEAR   Specific Gravity, Urine 1.030 1.005 - 1.030   pH 6.0 5.0 - 8.0   Glucose, UA >1000 (A) NEGATIVE mg/dL   Hgb urine dipstick LARGE (A) NEGATIVE   Bilirubin Urine MODERATE (A) NEGATIVE   Ketones, ur >80 (A) NEGATIVE mg/dL   Protein, ur >300 (A) NEGATIVE mg/dL   Nitrite POSITIVE (A) NEGATIVE   Leukocytes, UA LARGE (A) NEGATIVE  Urine microscopic-add on     Status: Abnormal   Collection Time: 09/16/15  4:37 PM  Result Value Ref Range   Squamous Epithelial / LPF 0-5 (A) NONE SEEN   WBC, UA TOO NUMEROUS TO COUNT 0 - 5 WBC/hpf   RBC / HPF TOO NUMEROUS TO COUNT 0  - 5 RBC/hpf   Bacteria, UA MANY (A) NONE SEEN  I-Stat CG4 Lactic Acid, ED     Status: None   Collection Time: 09/16/15  4:51 PM  Result Value Ref Range   Lactic Acid, Venous 1.80 0.5 - 2.0 mmol/L  C-reactive protein     Status: Abnormal   Collection Time: 09/16/15  7:07 PM  Result Value Ref Range   CRP 27.1 (H) <1.0 mg/dL  CK     Status: Abnormal   Collection Time: 09/16/15  7:07 PM  Result Value Ref Range   Total CK 30 (L) 38 - 234 U/L  Sedimentation rate     Status: Abnormal   Collection Time: 09/16/15  7:07 PM  Result Value Ref Range   Sed Rate 88 (H) 0 - 22 mm/hr  I-Stat CG4 Lactic Acid, ED     Status: None   Collection Time: 09/16/15  7:13 PM  Result Value Ref Range   Lactic Acid, Venous 1.28 0.5 - 2.0 mmol/L    UA  evidence of UTI  Lab Results  Component Value Date   HGBA1C 12.7 07/01/2014    CrCl cannot be calculated (Unknown ideal weight.).  BNP (last 3 results) No results for input(s): PROBNP in the last 8760 hours.  Other results:  I have pearsonaly reviewed this: ECG REPORT ordered  There were no vitals filed for this visit.   Cultures:  Component Value Date/Time   SDES URINE, CLEAN CATCH 09/13/2015 1458   SPECREQUEST Normal 09/13/2015 1458   CULT  09/13/2015 1458    >=100,000 COLONIES/mL METHICILLIN RESISTANT STAPHYLOCOCCUS AUREUS   REPTSTATUS 09/16/2015 FINAL 09/13/2015 1458     Radiological Exams on Admission: No results found.  Chart has been reviewed  Family not  at  Bedside    Assessment/Plan  70 year old female with history of diabetes, hypertension presents with one-week history of severe left hip pain with evidence of muscle inflammation per MRI was found to have UTI many for pain management orthopedics consult  Present on Admission:  . UTI (lower urinary tract infection) - treat with rocephin, await results of urine culture . Hip pain discussed with orthopedics, though see patient in consult. At this point thinks most likely  related to arthritis but given persistent pain recommend repeat MRI in 48 hour if no improvement.  Will see in consult in AM . Hypertension - continue home medication History of diabetes mellitus poorly controlled continue sliding scale hold metformin  Prophylaxis:  Lovenox   CODE STATUS:  FULL CODE  as per patient    Disposition:  likely will need placement for rehabilitation                          Other plan as per orders.  I have spent a total of 68 min on this admission  extra time was spent to discuss case with [place] 09/16/2015, 11:09 PM    [facility]  Pager [phone]   after 2 AM please page floor coverage PA If 7AM-7PM, please contact the day team taking care of the patient  [email or web address]  Password [email or web address]

## 2015-09-16 NOTE — ED Notes (Signed)
Pt. attempted to ambulate with extensive assistance / pain at left inner groin while ambulating , EDP notified.

## 2015-09-16 NOTE — ED Notes (Signed)
Pt was called for triage x 2.  Pt will be discharged from the computer as leaving prior to discharge.

## 2015-09-17 DIAGNOSIS — E119 Type 2 diabetes mellitus without complications: Secondary | ICD-10-CM

## 2015-09-17 LAB — CBC
HCT: 33.8 % — ABNORMAL LOW (ref 36.0–46.0)
Hemoglobin: 11.9 g/dL — ABNORMAL LOW (ref 12.0–15.0)
MCH: 29.8 pg (ref 26.0–34.0)
MCHC: 35.2 g/dL (ref 30.0–36.0)
MCV: 84.7 fL (ref 78.0–100.0)
PLATELETS: 291 10*3/uL (ref 150–400)
RBC: 3.99 MIL/uL (ref 3.87–5.11)
RDW: 13.2 % (ref 11.5–15.5)
WBC: 10.2 10*3/uL (ref 4.0–10.5)

## 2015-09-17 LAB — GLUCOSE, CAPILLARY
GLUCOSE-CAPILLARY: 273 mg/dL — AB (ref 65–99)
GLUCOSE-CAPILLARY: 171 mg/dL — AB (ref 65–99)
Glucose-Capillary: 151 mg/dL — ABNORMAL HIGH (ref 65–99)
Glucose-Capillary: 149 mg/dL — ABNORMAL HIGH (ref 65–99)
Glucose-Capillary: 267 mg/dL — ABNORMAL HIGH (ref 65–99)
Glucose-Capillary: 177 mg/dL — ABNORMAL HIGH (ref 65–99)

## 2015-09-17 LAB — COMPREHENSIVE METABOLIC PANEL
ALBUMIN: 2.1 g/dL — AB (ref 3.5–5.0)
ALK PHOS: 97 U/L (ref 38–126)
ALT: 14 U/L (ref 14–54)
ANION GAP: 10 (ref 5–15)
AST: 14 U/L — AB (ref 15–41)
BILIRUBIN TOTAL: 0.9 mg/dL (ref 0.3–1.2)
BUN: 10 mg/dL (ref 6–20)
CALCIUM: 7.9 mg/dL — AB (ref 8.9–10.3)
CO2: 21 mmol/L — AB (ref 22–32)
CREATININE: 0.42 mg/dL — AB (ref 0.44–1.00)
Chloride: 103 mmol/L (ref 101–111)
GFR calc Af Amer: >60 mL/min (ref >60)
GFR calc non Af Amer: >60 mL/min (ref >60)
GLUCOSE: 186 mg/dL — AB (ref 65–99)
Potassium: 3.2 mmol/L — ABNORMAL LOW (ref 3.5–5.1)
SODIUM: 134 mmol/L — AB (ref 135–145)
TOTAL PROTEIN: 5.6 g/dL — AB (ref 6.5–8.1)

## 2015-09-17 LAB — PHOSPHORUS: Phosphorus: 1.9 mg/dL — ABNORMAL LOW (ref 2.5–4.6)

## 2015-09-17 LAB — TSH: TSH: 1.247 u[IU]/mL (ref 0.350–4.500)

## 2015-09-17 LAB — MAGNESIUM: MAGNESIUM: 1.5 mg/dL — AB (ref 1.7–2.4)

## 2015-09-17 MED ORDER — POTASSIUM CHLORIDE 10 MEQ/100ML IV SOLN
10.0000 meq | INTRAVENOUS | Status: DC
  Administered 2015-09-17: 10 meq via INTRAVENOUS
  Filled 2015-09-17: qty 100

## 2015-09-17 MED ORDER — ACETAMINOPHEN 325 MG PO TABS: 650.0000 mg | ORAL_TABLET | Freq: Four times a day (QID) | ORAL | Status: DC | PRN

## 2015-09-17 MED ORDER — SODIUM CHLORIDE 0.9 % IV SOLN
INTRAVENOUS | Status: DC
  Administered 2015-09-17 – 2015-09-18 (×2): via INTRAVENOUS
  Administered 2015-09-19: 1000 mL via INTRAVENOUS
  Administered 2015-09-20 – 2015-09-21 (×2): via INTRAVENOUS

## 2015-09-17 MED ORDER — TRAMADOL HCL 50 MG PO TABS
50.0000 mg | ORAL_TABLET | Freq: Four times a day (QID) | ORAL | Status: DC | PRN
  Administered 2015-09-18: 50 mg via ORAL
  Filled 2015-09-17: qty 1

## 2015-09-17 MED ORDER — INSULIN ASPART 100 UNIT/ML ~~LOC~~ SOLN
0.0000 [IU] | Freq: Three times a day (TID) | SUBCUTANEOUS | Status: DC
  Administered 2015-09-17: 8 [IU] via SUBCUTANEOUS
  Administered 2015-09-17: 3 [IU] via SUBCUTANEOUS
  Administered 2015-09-17: 2 [IU] via SUBCUTANEOUS
  Administered 2015-09-18 – 2015-09-19 (×4): 3 [IU] via SUBCUTANEOUS
  Administered 2015-09-20: 2 [IU] via SUBCUTANEOUS
  Administered 2015-09-21: 3 [IU] via SUBCUTANEOUS
  Administered 2015-09-21: 2 [IU] via SUBCUTANEOUS
  Administered 2015-09-22: 5 [IU] via SUBCUTANEOUS
  Administered 2015-09-22: 2 [IU] via SUBCUTANEOUS

## 2015-09-17 MED ORDER — POTASSIUM CHLORIDE CRYS ER 20 MEQ PO TBCR
40.0000 meq | EXTENDED_RELEASE_TABLET | Freq: Once | ORAL | Status: AC
  Administered 2015-09-17: 40 meq via ORAL
  Filled 2015-09-17: qty 2

## 2015-09-17 MED ORDER — DOXYCYCLINE HYCLATE 100 MG IV SOLR
100.0000 mg | Freq: Two times a day (BID) | INTRAVENOUS | Status: DC
  Administered 2015-09-17 – 2015-09-18 (×2): 100 mg via INTRAVENOUS
  Filled 2015-09-17 (×3): qty 100

## 2015-09-17 MED ORDER — INSULIN GLARGINE 100 UNIT/ML ~~LOC~~ SOLN
10.0000 [IU] | Freq: Every day | SUBCUTANEOUS | Status: DC
  Administered 2015-09-17 – 2015-09-21 (×6): 10 [IU] via SUBCUTANEOUS
  Filled 2015-09-17 (×8): qty 0.1

## 2015-09-17 MED ORDER — ENOXAPARIN SODIUM 30 MG/0.3ML ~~LOC~~ SOLN
20.0000 mg | SUBCUTANEOUS | Status: DC
  Administered 2015-09-17 – 2015-09-22 (×6): 20 mg via SUBCUTANEOUS
  Filled 2015-09-17 (×3): qty 0.2
  Filled 2015-09-17: qty 0.3
  Filled 2015-09-17: qty 0.2
  Filled 2015-09-17 (×2): qty 0.3
  Filled 2015-09-17: qty 0.2
  Filled 2015-09-17 (×3): qty 0.3
  Filled 2015-09-17: qty 0.2

## 2015-09-17 MED ORDER — INSULIN ASPART 100 UNIT/ML ~~LOC~~ SOLN
0.0000 [IU] | Freq: Every day | SUBCUTANEOUS | Status: DC
  Administered 2015-09-17: 3 [IU] via SUBCUTANEOUS

## 2015-09-17 MED ORDER — MAGNESIUM SULFATE 2 GM/50ML IV SOLN
2.0000 g | Freq: Once | INTRAVENOUS | Status: AC
  Administered 2015-09-17: 2 g via INTRAVENOUS
  Filled 2015-09-17: qty 50

## 2015-09-17 MED ORDER — ONDANSETRON HCL 4 MG PO TABS: 4.0000 mg | ORAL_TABLET | Freq: Four times a day (QID) | ORAL | Status: DC | PRN

## 2015-09-17 MED ORDER — HYDROMORPHONE HCL 1 MG/ML IJ SOLN
1.0000 mg | Freq: Once | INTRAMUSCULAR | Status: AC
  Administered 2015-09-17: 1 mg via INTRAVENOUS
  Filled 2015-09-17: qty 1

## 2015-09-17 MED ORDER — MORPHINE SULFATE (PF) 2 MG/ML IV SOLN
2.0000 mg | INTRAVENOUS | Status: DC | PRN
  Administered 2015-09-17 – 2015-09-21 (×8): 2 mg via INTRAVENOUS
  Filled 2015-09-17 (×8): qty 1

## 2015-09-17 MED ORDER — ACETAMINOPHEN 650 MG RE SUPP: 650.0000 mg | Freq: Four times a day (QID) | RECTAL | Status: DC | PRN

## 2015-09-17 MED ORDER — CEFTRIAXONE SODIUM 1 G IJ SOLR
1.0000 g | INTRAMUSCULAR | Status: DC
  Administered 2015-09-17: 1 g via INTRAVENOUS
  Filled 2015-09-17 (×2): qty 10

## 2015-09-17 MED ORDER — ONDANSETRON HCL 4 MG/2ML IJ SOLN
4.0000 mg | Freq: Four times a day (QID) | INTRAMUSCULAR | Status: DC | PRN
  Administered 2015-09-17 – 2015-09-18 (×3): 4 mg via INTRAVENOUS
  Filled 2015-09-17 (×3): qty 2

## 2015-09-17 MED ORDER — HYDROCODONE-ACETAMINOPHEN 5-325 MG PO TABS
1.0000 | ORAL_TABLET | ORAL | Status: DC | PRN
  Administered 2015-09-17 – 2015-09-22 (×12): 2 via ORAL
  Administered 2015-09-22 (×2): 1 via ORAL
  Filled 2015-09-17 (×4): qty 2
  Filled 2015-09-17: qty 1
  Filled 2015-09-17 (×3): qty 2
  Filled 2015-09-17: qty 1
  Filled 2015-09-17 (×6): qty 2

## 2015-09-17 MED ORDER — POLYETHYLENE GLYCOL 3350 17 G PO PACK
17.0000 g | PACK | Freq: Every day | ORAL | Status: DC | PRN
  Administered 2015-09-21: 17 g via ORAL
  Filled 2015-09-17: qty 1

## 2015-09-17 NOTE — ED Provider Notes (Signed)
EKG Interpretation  Date/Time:  Friday September 16 2015 23:39:50 EST Ventricular Rate:  91 PR Interval:  146 QRS Duration: 69 QT Interval:  363 QTC Calculation: 447 R Axis:   45 Text Interpretation:  Sinus rhythm Borderline repolarization abnormality Baseline wander in lead(s) I II aVR aVL No significant change was found Confirmed by Nitish Roes  MD, Laquentin Loudermilk ([id number]) on 09/17/2015 12:23:56 AM        Jola Schmidt, MD 09/17/15 [id number]

## 2015-09-17 NOTE — Consult Note (Signed)
Reason for Consult:Left hip pain Referring Physician: Nydia Ytuarte is an 70 y.o. female.  HPI: Asked by Dr Alvan Dame to see this  70 yo female with 4-5 day history of severe left hip pain.  Denies fall or trauma. Unable to walk due to pain  Past Medical History  Diagnosis Date  . Diabetes mellitus   . Hypertension     History reviewed. No pertinent past surgical history.  Family History  Problem Relation Age of Onset  . CAD Mother   . CAD Father   . Hypertension Father   . Diabetes Neg Hx   . Cancer Neg Hx     Social History:  reports that she has never smoked. She does not have any smokeless tobacco history on file. She reports that she does not drink alcohol or use illicit drugs.  Allergies: No Known Allergies  Medications: I have reviewed the patient's current medications.  Results for orders placed or performed during the hospital encounter of 09/16/15 (from the past 48 hour(s))  CBG monitoring, ED     Status: Abnormal   Collection Time: 09/16/15  4:13 PM  Result Value Ref Range   Glucose-Capillary 279 (H) 65 - 99 mg/dL  Lipase, blood     Status: None   Collection Time: 09/16/15  4:26 PM  Result Value Ref Range   Lipase 15 11 - 51 U/L  Comprehensive metabolic panel     Status: Abnormal   Collection Time: 09/16/15  4:26 PM  Result Value Ref Range   Sodium 130 (L) 135 - 145 mmol/L   Potassium 3.8 3.5 - 5.1 mmol/L   Chloride 95 (L) 101 - 111 mmol/L   CO2 22 22 - 32 mmol/L   Glucose, Bld 301 (H) 65 - 99 mg/dL   BUN 13 6 - 20 mg/dL   Creatinine, Ser 0.57 0.44 - 1.00 mg/dL   Calcium 8.9 8.9 - 10.3 mg/dL   Total Protein 6.5 6.5 - 8.1 g/dL   Albumin 2.6 (L) 3.5 - 5.0 g/dL   AST 15 15 - 41 U/L   ALT 17 14 - 54 U/L   Alkaline Phosphatase 113 38 - 126 U/L   Total Bilirubin 1.0 0.3 - 1.2 mg/dL   GFR calc non Af Amer >60 >60 mL/min   GFR calc Af Amer >60 >60 mL/min    Comment: (NOTE) The eGFR has been calculated using the CKD EPI equation. This calculation has  not been validated in all clinical situations. eGFR's persistently <60 mL/min signify possible Chronic Kidney Disease.    Anion gap 13 5 - 15  CBC     Status: Abnormal   Collection Time: 09/16/15  4:26 PM  Result Value Ref Range   WBC 13.1 (H) 4.0 - 10.5 K/uL   RBC 4.24 3.87 - 5.11 MIL/uL   Hemoglobin 12.4 12.0 - 15.0 g/dL   HCT 35.7 (L) 36.0 - 46.0 %   MCV 84.2 78.0 - 100.0 fL   MCH 29.2 26.0 - 34.0 pg   MCHC 34.7 30.0 - 36.0 g/dL   RDW 13.0 11.5 - 15.5 %   Platelets 294 150 - 400 K/uL  Urinalysis, Routine w reflex microscopic-may I&O cath if menses (not at [facility])     Status: Abnormal   Collection Time: 09/16/15  4:37 PM  Result Value Ref Range   Color, Urine RED (A) YELLOW    Comment: BIOCHEMICALS MAY BE AFFECTED BY COLOR   APPearance TURBID (A) CLEAR   Specific Gravity,  Urine 1.030 1.005 - 1.030   pH 6.0 5.0 - 8.0   Glucose, UA >1000 (A) NEGATIVE mg/dL   Hgb urine dipstick LARGE (A) NEGATIVE   Bilirubin Urine MODERATE (A) NEGATIVE   Ketones, ur >80 (A) NEGATIVE mg/dL   Protein, ur >300 (A) NEGATIVE mg/dL   Nitrite POSITIVE (A) NEGATIVE   Leukocytes, UA LARGE (A) NEGATIVE  Urine microscopic-add on     Status: Abnormal   Collection Time: 09/16/15  4:37 PM  Result Value Ref Range   Squamous Epithelial / LPF 0-5 (A) NONE SEEN   WBC, UA TOO NUMEROUS TO COUNT 0 - 5 WBC/hpf   RBC / HPF TOO NUMEROUS TO COUNT 0 - 5 RBC/hpf   Bacteria, UA MANY (A) NONE SEEN  I-Stat CG4 Lactic Acid, ED     Status: None   Collection Time: 09/16/15  4:51 PM  Result Value Ref Range   Lactic Acid, Venous 1.80 0.5 - 2.0 mmol/L  C-reactive protein     Status: Abnormal   Collection Time: 09/16/15  7:07 PM  Result Value Ref Range   CRP 27.1 (H) <1.0 mg/dL  CK     Status: Abnormal   Collection Time: 09/16/15  7:07 PM  Result Value Ref Range   Total CK 30 (L) 38 - 234 U/L  Sedimentation rate     Status: Abnormal   Collection Time: 09/16/15  7:07 PM  Result Value Ref Range   Sed Rate 88 (H) 0 - 22  mm/hr  I-Stat CG4 Lactic Acid, ED     Status: None   Collection Time: 09/16/15  7:13 PM  Result Value Ref Range   Lactic Acid, Venous 1.28 0.5 - 2.0 mmol/L  CBG monitoring, ED     Status: Abnormal   Collection Time: 09/16/15 10:40 PM  Result Value Ref Range   Glucose-Capillary 277 (H) 65 - 99 mg/dL  Glucose, capillary     Status: Abnormal   Collection Time: 09/17/15  1:45 AM  Result Value Ref Range   Glucose-Capillary 273 (H) 65 - 99 mg/dL    Ct Renal Stone Study  09/16/2015  CLINICAL DATA:  Hematuria and groin pain. Difficulty with ambulation. EXAM: CT ABDOMEN AND PELVIS WITHOUT CONTRAST TECHNIQUE: Multidetector CT imaging of the abdomen and pelvis was performed following the standard protocol without IV contrast. COMPARISON:  Pelvic MRI 09/13/2015 FINDINGS: Lower chest: Mild motion artifact. Dependent atelectasis with trace pleural effusions. There are coronary artery calcifications. Heart is normal in size. Liver: Motion artifact inferiorly. Allowing for this and lack of intravenous contrast, no focal lesion is seen. Hepatobiliary: Gallbladder physiologically distended, no calcified stone. No biliary dilatation. Pancreas: Atrophic.  No ductal dilatation or inflammation. Spleen: Normal. Adrenal glands: No nodule.  Mild thickening on the left. Kidneys: Motion artifact through the lower right kidney. No hydronephrosis or urolithiasis. Mild nonspecific bilateral perinephric edema. Stomach/Bowel: Stomach physiologically distended. There are no dilated or thickened small bowel loops. Moderate volume of stool throughout the colon without colonic wall thickening. The appendix is normal. Vascular/Lymphatic: No retroperitoneal adenopathy, portions obscured by motion. Abdominal aorta is normal in caliber. Moderate atherosclerosis without aneurysm. Reproductive: Uterus remains in situ.  No evidence of adnexal mass. Bladder: Mildly distended but with diffuse wall thickening. Mild perivesicular edema. Other: No  free air, free fluid, or intra-abdominal fluid collection. Musculoskeletal: Soft tissue stranding about iliacus muscle and left hip adductor musculature, better appreciated on MRI. The additional areas of muscle edema are not as well characterized. No soft tissue  air or developing fluid collection allowing for noncontrast exam. There are no acute or suspicious osseous abnormalities. 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 No bony destructive change. IMPRESSION: 1. Urinary bladder wall thickening and perivesicular edema, suggestive urinary tract infection. No urolithiasis or hydronephrosis. 2. Left iliopsoas and hip adductor muscle edema, better appreciated on prior MRI. No developing fluid collections or soft tissue air. Electronically Signed   By: Jeb Levering M.D.   On: 09/16/2015 22:46    ROS Blood pressure 128/66, pulse 120, temperature 98.3 F (36.8 C), temperature source Oral, resp. rate 18, SpO2 93 %. Physical Exam  Patient in moderate distress in bed. Left hip tender to palpation over the ASIS and Ilium. Thigh compartment supple. Leg lengths equal. No pain with log roll in a neutral arc of motion  Assessment/Plan: Severe right hip pain with inflammation along the adductor and iliopsoas muscles. No effusion on CT scan and no evidence for pelvic bony abnormality on CT.  There was a question of a stress fracture on the plain AP Pelvis XRAY that is not seen on the CT scan so I doubt this. 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  No sign of hip sepsis either clinically or by imaging. Concur with plan for bedrest and empiric antibiotics and close clinical observation. Pain management as patient appears quite uncomfortable. DVT prophylaxis needs to be considered as well  Edrei Norgaard,STEVEN R 09/17/2015, 7:49 AM

## 2015-09-17 NOTE — Progress Notes (Signed)
[facility] PROGRESS NOTE  Emily Hoover [id number] DOB: [birth year] DOA: 09/16/2015  PCP: No primary care provider on file.  Brief HPI: 70 year old female of Hispanic origin who doesn't speak much English with a past medical history of hypertension and diabetes, presented with the complaints of left hip pain ongoing for the last 1 week. She was diagnosed with myositis based on MRI. She was given steroids. Her pain persisted. She presented because of progressively worsening fatigue. In the ED evaluation revealed a urinary tract infection. She was hospitalized for further management.  Past medical history:  Past Medical History  Diagnosis Date  . Diabetes mellitus   . Hypertension     Consultants: Orthopedics  Procedures: None  Antibiotics: Ceftriaxone and doxycycline  Subjective: Patient's son was able to interpret. Patient feels better. Her pain in the left hip is improved, at least when she is lying still. Denies any abdominal pain but has been nauseated. No vomiting.  Objective: Vital Signs  Filed Vitals:   09/17/15 0150 09/17/15 0606 09/17/15 1004 09/17/15 1420  BP: 123/62 128/66 154/75 120/60  Pulse: 78 120 97 93  Temp: 98.3 F (36.8 C) 98.3 F (36.8 C) 98.1 F (36.7 C) 97.5 F (36.4 C)  TempSrc: Oral Oral Oral Oral  Resp: 16 18 16 20   SpO2: 95% 93% 96% 95%    Intake/Output Summary (Last 24 hours) at 09/17/15 1439 Last data filed at 09/17/15 0809  Gross per 24 hour  Intake   1060 ml  Output      0 ml  Net   1060 ml   There were no vitals filed for this visit.  General appearance: alert, cooperative and no distress Resp: clear to auscultation bilaterally Cardio: S1, S2 is normal, regular. No S3, S4. Systolic murmur appreciated over the precordium. GI: soft, non-tender; bowel sounds normal; no masses,  no organomegaly Extremities: Restricted range of motion of the left hip. No obvious swelling is appreciated. Skin: Skin color, texture, turgor  normal. No rashes or lesions Neurologic: Awake and alert. No focal neurological deficits.  Lab Results:  Basic Metabolic Panel:  Recent Labs Lab 09/13/15 1448 09/16/15 1626 09/17/15 0746  NA 133* 130* 134*  K 3.8 3.8 3.2*  CL 98* 95* 103  CO2 25 22 21*  GLUCOSE 306* 301* 186*  BUN 16 13 10   CREATININE 0.58 0.57 0.42*  CALCIUM 8.8* 8.9 7.9*  MG  --   --  1.5*  PHOS  --   --  1.9*   Liver Function Tests:  Recent Labs Lab 09/16/15 1626 09/17/15 0746  AST 15 14*  ALT 17 14  ALKPHOS 113 97  BILITOT 1.0 0.9  PROT 6.5 5.6*  ALBUMIN 2.6* 2.1*    Recent Labs Lab 09/16/15 1626  LIPASE 15   CBC:  Recent Labs Lab 09/13/15 1448 09/16/15 1626 09/17/15 0746  WBC 9.9 13.1* 10.2  NEUTROABS 8.6*  --   --   HGB 12.7 12.4 11.9*  HCT 37.1 35.7* 33.8*  MCV 86.1 84.2 84.7  PLT 241 294 291   Cardiac Enzymes:  Recent Labs Lab 09/16/15 1907  CKTOTAL 30*   CBG:  Recent Labs Lab 09/16/15 2240 09/17/15 0145 09/17/15 0759 09/17/15 1039 09/17/15 1218  GLUCAP 277* 273* 171* 151* 149*    Recent Results (from the past 240 hour(s))  Urine culture     Status: None   Collection Time: 09/13/15  2:58 PM  Result Value Ref Range Status   Specimen Description URINE, CLEAN CATCH  Final   Special Requests Normal  Final   Culture   Final    >=100,000 COLONIES/mL METHICILLIN RESISTANT STAPHYLOCOCCUS AUREUS   Report Status 09/16/2015 FINAL  Final   Organism ID, Bacteria METHICILLIN RESISTANT STAPHYLOCOCCUS AUREUS  Final      Susceptibility   Methicillin resistant staphylococcus aureus - MIC*    CIPROFLOXACIN <=0.5 SENSITIVE Sensitive     GENTAMICIN <=0.5 SENSITIVE Sensitive     NITROFURANTOIN <=16 SENSITIVE Sensitive     OXACILLIN >=4 RESISTANT Resistant     TETRACYCLINE <=1 SENSITIVE Sensitive     VANCOMYCIN <=0.5 SENSITIVE Sensitive     TRIMETH/SULFA <=10 SENSITIVE Sensitive     CLINDAMYCIN <=0.25 SENSITIVE Sensitive     RIFAMPIN <=0.5 SENSITIVE Sensitive      Inducible Clindamycin NEGATIVE Sensitive     * >=100,000 COLONIES/mL METHICILLIN RESISTANT STAPHYLOCOCCUS AUREUS  Urine culture     Status: None (Preliminary result)   Collection Time: 09/16/15  4:37 PM  Result Value Ref Range Status   Specimen Description URINE, RANDOM  Final   Special Requests NONE  Final   Culture TOO YOUNG TO READ  Final   Report Status PENDING  Incomplete      Studies/Results: Ct Renal Stone Study  09/16/2015  CLINICAL DATA:  Hematuria and groin pain. Difficulty with ambulation. EXAM: CT ABDOMEN AND PELVIS WITHOUT CONTRAST TECHNIQUE: Multidetector CT imaging of the abdomen and pelvis was performed following the standard protocol without IV contrast. COMPARISON:  Pelvic MRI 09/13/2015 FINDINGS: Lower chest: Mild motion artifact. Dependent atelectasis with trace pleural effusions. There are coronary artery calcifications. Heart is normal in size. Liver: Motion artifact inferiorly. Allowing for this and lack of intravenous contrast, no focal lesion is seen. Hepatobiliary: Gallbladder physiologically distended, no calcified stone. No biliary dilatation. Pancreas: Atrophic.  No ductal dilatation or inflammation. Spleen: Normal. Adrenal glands: No nodule.  Mild thickening on the left. Kidneys: Motion artifact through the lower right kidney. No hydronephrosis or urolithiasis. Mild nonspecific bilateral perinephric edema. Stomach/Bowel: Stomach physiologically distended. There are no dilated or thickened small bowel loops. Moderate volume of stool throughout the colon without colonic wall thickening. The appendix is normal. Vascular/Lymphatic: No retroperitoneal adenopathy, portions obscured by motion. Abdominal aorta is normal in caliber. Moderate atherosclerosis without aneurysm. Reproductive: Uterus remains in situ.  No evidence of adnexal mass. Bladder: Mildly distended but with diffuse wall thickening. Mild perivesicular edema. Other: No free air, free fluid, or intra-abdominal fluid  collection. Musculoskeletal: Soft tissue stranding about iliacus muscle and left hip adductor musculature, better appreciated on MRI. The additional areas of muscle edema are not as well characterized. No soft tissue air or developing fluid collection allowing for noncontrast exam. There are no acute or suspicious osseous abnormalities. 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 No bony destructive change. IMPRESSION: 1. Urinary bladder wall thickening and perivesicular edema, suggestive urinary tract infection. No urolithiasis or hydronephrosis. 2. Left iliopsoas and hip adductor muscle edema, better appreciated on prior MRI. No developing fluid collections or soft tissue air. Electronically Signed   By: Jeb Levering M.D.   On: 09/16/2015 22:46    Medications:  Scheduled: . cefTRIAXone (ROCEPHIN)  IV  1 g Intravenous Q24H  . doxycycline (VIBRAMYCIN) IV  100 mg Intravenous Q12H  . enoxaparin (LOVENOX) injection  20 mg Subcutaneous Q24H  . insulin aspart  0-15 Units Subcutaneous TID WC  . insulin aspart  0-5 Units Subcutaneous QHS  . insulin glargine  10 Units Subcutaneous QHS  .  potassium chloride  10 mEq Intravenous Q1 Hr x 4   Continuous: . sodium chloride 50 mL/hr at 09/17/15 1424   YFV:[id number] **OR** acetaminophen, HYDROcodone-acetaminophen, morphine injection, ondansetron **OR** ondansetron (ZOFRAN) IV, polyethylene glycol, traMADol  Assessment/Plan:  Active Problems:   Diabetes (HCC)   UTI (lower urinary tract infection)   Hip pain   Hypertension   Pain management    Urinary tract infection Urine culture from March 7, revealed MRSA. Patient is currently on ceftriaxone. We will add doxycycline. Urine cultures from this hospitalization pending. Obtain blood cultures. Echocardiogram. CT scan of the abdomen and pelvis does not reveal any abscess.  Left hip pain. Patient underwent MRI recently which showed abnormal  appearance of the musculature including the iliopsoas and other muscle groups. There was a concern regarding myositis. No abscess was noted. CK level was normal. CRP is 27.1. ESR is 88. Patient was given a course of steroids with only minimal improvement. Seen by orthopedics this admission. No fractures identified on CT scan. Bedrest and pain control has been recommended by orthopedics. Involve PT and OT once cleared by orthopedics.  History of essential hypertension Monitor blood pressures closely.   History of diabetes mellitus type 2, poorly controlled Continue SSI. Holding metformin and Amaryl. On Lantus.  Hypokalemia and hypomagnesemia This will be repleted.  Normocytic anemia Obtain anemia panel.   DVT Prophylaxis: Lovenox    Code Status: Full code  Family Communication: Discussed with the patient's family   Disposition Plan: Await improvement.      [facility] Pager [phone] 09/17/2015, 2:39 PM  If 7PM-7AM, please contact night-coverage at [email or web address], password [facility]

## 2015-09-17 NOTE — Progress Notes (Signed)
NURSING PROGRESS NOTE  Rafaella PerezMRN: [id number] Admission Data: 09/17/2015 1:00AM Attending Provider: Toy Baker, MD PCP: No primary care provider on file. Code status: Full  Allergies: No Known Allergies  Past Medical History:  Past Medical History  Diagnosis Date  . Diabetes mellitus   . Hypertension     Past Surgical History: History reviewed. No pertinent past surgical history.  Emily Hoover is a 70 y.o. female patient, arrived to floor in room [phone] via stretcher, transferred from ED. Patient alert and oriented. No acute distress noted. Denies pain at this time.   Vital signs: Oral temperature 98.3 F (36.8 C), Blood pressure 123/62, Pulse 78, RR 16, SpO2 95 % on room air. Height 5', weight 99 lbs.   IV access: left forearm; condition patent and no redness.  Skin: intact, no pressure ulcer noted in sacral area.   Patient's ID armband verified with patient and in place. Information packet given to patient. Fall risk assessed, SR up X2, patient understands she must use the call bell if needing anything. Call bell within reach.

## 2015-09-17 NOTE — Progress Notes (Signed)
Patient complained of continuous pain after receiving prn pain meds. On call MD notified and new orders placed. Patient was given dilaudid once. She now says the pain is better but is complaining of dizziness, diaphoresis, nausea, and vomiting. VSS. Cool compress applied to head and prn zofran given. Will continue to monitor.

## 2015-09-17 NOTE — ED Notes (Signed)
Attempted to call report

## 2015-09-18 ENCOUNTER — Telehealth: Payer: Self-pay | Admitting: *Deleted

## 2015-09-18 DIAGNOSIS — A499 Bacterial infection, unspecified: Secondary | ICD-10-CM

## 2015-09-18 DIAGNOSIS — D649 Anemia, unspecified: Secondary | ICD-10-CM

## 2015-09-18 LAB — CBC
HEMATOCRIT: 32.5 % — AB (ref 36.0–46.0)
HEMOGLOBIN: 10.9 g/dL — AB (ref 12.0–15.0)
MCH: 28.6 pg (ref 26.0–34.0)
MCHC: 33.5 g/dL (ref 30.0–36.0)
MCV: 85.3 fL (ref 78.0–100.0)
Platelets: 321 10*3/uL (ref 150–400)
RBC: 3.81 MIL/uL — AB (ref 3.87–5.11)
RDW: 13.5 % (ref 11.5–15.5)
WBC: 10.6 10*3/uL — ABNORMAL HIGH (ref 4.0–10.5)

## 2015-09-18 LAB — BASIC METABOLIC PANEL
Anion gap: 8 (ref 5–15)
BUN: 9 mg/dL (ref 6–20)
CHLORIDE: 100 mmol/L — AB (ref 101–111)
CO2: 24 mmol/L (ref 22–32)
Calcium: 7.9 mg/dL — ABNORMAL LOW (ref 8.9–10.3)
Creatinine, Ser: 0.43 mg/dL — ABNORMAL LOW (ref 0.44–1.00)
GFR calc non Af Amer: >60 mL/min (ref >60)
Glucose, Bld: 239 mg/dL — ABNORMAL HIGH (ref 65–99)
POTASSIUM: 4 mmol/L (ref 3.5–5.1)
SODIUM: 132 mmol/L — AB (ref 135–145)

## 2015-09-18 LAB — VITAMIN B12: VITAMIN B 12: 361 pg/mL (ref 180–914)

## 2015-09-18 LAB — GLUCOSE, CAPILLARY
GLUCOSE-CAPILLARY: 192 mg/dL — AB (ref 65–99)
GLUCOSE-CAPILLARY: 174 mg/dL — AB (ref 65–99)
GLUCOSE-CAPILLARY: 183 mg/dL — AB (ref 65–99)
Glucose-Capillary: 190 mg/dL — ABNORMAL HIGH (ref 65–99)

## 2015-09-18 LAB — IRON AND TIBC
IRON: 17 ug/dL — AB (ref 28–170)
Saturation Ratios: 12 % (ref 10.4–31.8)
TIBC: 137 ug/dL — AB (ref 250–450)
UIBC: 120 ug/dL

## 2015-09-18 LAB — FOLATE: Folate: 12.3 ng/mL (ref >5.9)

## 2015-09-18 LAB — URINE CULTURE

## 2015-09-18 LAB — MAGNESIUM: MAGNESIUM: 1.8 mg/dL (ref 1.7–2.4)

## 2015-09-18 LAB — RETICULOCYTES
RBC.: 3.81 MIL/uL — ABNORMAL LOW (ref 3.87–5.11)
Retic Count, Absolute: 22.9 10*3/uL (ref 19.0–186.0)
Retic Ct Pct: 0.6 % (ref 0.4–3.1)

## 2015-09-18 LAB — FERRITIN: Ferritin: 341 ng/mL — ABNORMAL HIGH (ref 11–307)

## 2015-09-18 MED ORDER — DEXTROSE 5 % IV SOLN
2.0000 g | INTRAVENOUS | Status: DC
  Administered 2015-09-18: 2 g via INTRAVENOUS
  Filled 2015-09-18 (×2): qty 2

## 2015-09-18 MED ORDER — VANCOMYCIN HCL IN DEXTROSE 1-5 GM/200ML-% IV SOLN
1000.0000 mg | Freq: Every day | INTRAVENOUS | Status: DC
  Administered 2015-09-18 – 2015-09-21 (×4): 1000 mg via INTRAVENOUS
  Filled 2015-09-18 (×5): qty 200

## 2015-09-18 MED ORDER — INFLUENZA VAC SPLIT QUAD 0.5 ML IM SUSY
0.5000 mL | PREFILLED_SYRINGE | INTRAMUSCULAR | Status: AC
  Administered 2015-09-22: 0.5 mL via INTRAMUSCULAR
  Filled 2015-09-18: qty 0.5

## 2015-09-18 NOTE — Progress Notes (Signed)
[place] interpreter # [phone] to communicate with patient. Jacqualyn Posey, RN

## 2015-09-18 NOTE — Progress Notes (Signed)
Orthopedics Progress Note  Subjective: Patient reports continued pain in the left hip area, worse with movement  Objective:  Filed Vitals:   09/18/15 0554 09/18/15 1019  BP: 140/63 132/66  Pulse: 90 98  Temp: 98.2 F (36.8 C) 98.3 F (36.8 C)  Resp: 16 16    General: Awake and alert  Musculoskeletal: pain with AROM of the left hip. No pain with Passive logroll Neurovascularly intact  Lab Results  Component Value Date   WBC 10.6* 09/18/2015   HGB 10.9* 09/18/2015   HCT 32.5* 09/18/2015   MCV 85.3 09/18/2015   PLT 321 09/18/2015       Component Value Date/Time   NA 132* 09/18/2015 0631   K 4.0 09/18/2015 0631   CL 100* 09/18/2015 0631   CO2 24 09/18/2015 0631   GLUCOSE 239* 09/18/2015 0631   BUN 9 09/18/2015 0631   CREATININE 0.43* 09/18/2015 0631   CREATININE 0.54 07/01/2014 1244   CALCIUM 7.9* 09/18/2015 0631   GFRNONAA >60 09/18/2015 0631   GFRAA >60 09/18/2015 0631    No results found for: INR, PROTIME  Assessment/Plan: Patient with 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 Veverly Fells, MD 09/18/2015 11:54 AM

## 2015-09-18 NOTE — ED Notes (Signed)
Post ED Visit - Positive Culture Follow-up  Culture report reviewed by antimicrobial stewardship pharmacist:  []  Elenor Quinones, Pharm.D. []  Heide Guile, Pharm.D., BCPS [x]  Parks Neptune, Pharm.D. []  Alycia Rossetti, Pharm.D., BCPS []  [place], Pharm.D., BCPS, AAHIVP []  Legrand Como, Pharm.D., BCPS, AAHIVP []  Milus Glazier, Pharm.D. []  Rob Evette Doffing, Pharm.D.  Positive urine culture Treated with none, organism sensitive to the same and no further patient follow-up is required at this time.  Harlon Flor [facility] 09/18/2015, 9:24 AM

## 2015-09-18 NOTE — Progress Notes (Signed)
Pharmacy Antibiotic Note  Emily Hoover is a 70 y.o. female admitted on 09/16/2015 with bacteremia.  Pt with 1/2 blood cx with GPC clusters. Pt on Rocephin and Doxycycling (Day #2). Pharmacy has been consulted for Vancomcyin dosing. Estimated CrCl 44 ml/min.  Plan: Vancomycin 1gm IV q24h Will f/u micro data, renal function, and pt's clinical condition Vanc trough prn      Temp (24hrs), Avg:98.2 F (36.8 C), Min:97.5 F (36.4 C), Max:98.7 F (37.1 C)   Recent Labs Lab 09/13/15 1448 09/16/15 1626 09/16/15 1651 09/16/15 1913 09/17/15 0746  WBC 9.9 13.1*  --   --  10.2  CREATININE 0.58 0.57  --   --  0.42*  LATICACIDVEN  --   --  1.80 1.28  --     CrCl cannot be calculated (Unknown ideal weight.).    No Known Allergies  Antimicrobials this admission: 3/11 Doxy >> 3/12 3/11 Rocephin >>  3/12 Vanc >>  Dose adjustments this admission: n/a  Microbiology results: 3/11 BCx: 1/2 GPC clusters 3/11 UCx:    Thank you for allowing pharmacy to be a part of this patient's care.  Sherlon Handing, PharmD, BCPS Clinical pharmacist, pager [phone] 09/18/2015 6:40 AM

## 2015-09-18 NOTE — Care Management Note (Addendum)
Case Management Note  Patient Details  Name: Emily Hoover MRN: [id number] Date of Birth: 24-Feb-1946  Subjective/Objective:    Presents with L hip pain, Myositis noted on MRI and pt with  UTI . Home alone, female of Hispanic origin who doesn't speak much [place]. History of hypertension and diabetes 3/10 - Pt with 1/2 blood cx with GPC clusters/ bacteremia . Action/Plan:  Ortho following, awaiting clearance....... PT/OT  Evaluation to follow. Return to home when medically stable.CM to f/u with d/c needs.  Expected Discharge Date:                  Expected Discharge Plan:  Home/Self Care  In-House Referral:     Discharge planning Services  CM Consult, Medication Assistance, Follow-up appt scheduled, [place] Acute Care Choice:    Choice offered to:     DME Arranged:    DME Agency:     HH Arranged:    HH Agency:     Status of Service:  In process, will continue to follow  Medicare Important Message Given:    Date Medicare IM Given:    Medicare IM give by:    Date Additional Medicare IM Given:    Additional Medicare Important Message give by:     If discussed at [place] of Stay Meetings, dates discussed:    Mila Merry (Daughter) [phone] Norma Fredrickson (Other)  [phone] Additional Comments:  Sharin Mons, [place] [phone] 09/18/2015, 9:10 AM

## 2015-09-18 NOTE — Progress Notes (Signed)
[facility] PROGRESS NOTE  Emily Hoover MBT:[id number] DOB: April 14, 1946 DOA: 09/16/2015  PCP: No primary care provider on file.  Brief HPI: 70 year old female of Hispanic origin who doesn't speak much English with a past medical history of hypertension and diabetes, presented with the complaints of left hip pain ongoing for the last 1 week. She was diagnosed with myositis based on MRI. She was given steroids. Her pain persisted. She presented because of progressively worsening fatigue. In the ED evaluation revealed a urinary tract infection. She was hospitalized for further management.  Past medical history:  Past Medical History  Diagnosis Date  . Diabetes mellitus   . Hypertension     Consultants: Orthopedics  Procedures: None  Antibiotics: Ceftriaxone and vancomycin  Subjective: Patient's daughter was able to interpret. Patient continues to have significant pain in her left thigh area. Nausea has improved. Appetite remains poor. Overall, denies much improvement since admission.   Objective: Vital Signs  Filed Vitals:   09/17/15 2246 09/18/15 0159 09/18/15 0554 09/18/15 1019  BP: 145/70 122/63 140/63 132/66  Pulse: 106 86 90 98  Temp: 98.4 F (36.9 C) 98.4 F (36.9 C) 98.2 F (36.8 C) 98.3 F (36.8 C)  TempSrc: Oral Oral Oral Oral  Resp:  _0 SpO2: 95%  100% 95%   No intake or output data in the 24 hours ending 09/18/15 1041 There were no vitals filed for this visit.  General appearance: alert, cooperative and no distress Resp: clear to auscultation bilaterally Cardio: S1, S2 is normal, regular. No S3, S4. Systolic murmur appreciated over the precordium. GI: soft, non-tender; bowel sounds normal; no masses,  no organomegaly Extremities: Restricted range of motion of the left hip. No obvious swelling is appreciated. Neurologic: Awake and alert. No focal neurological deficits.  Lab Results:  Basic Metabolic Panel:  Recent Labs Lab 09/13/15 1448  09/16/15 1626 09/17/15 0746 09/18/15 0631  NA 133* 130* 134* 132*  K 3.8 3.8 3.2* 4.0  CL 98* 95* 103 100*  CO2 25 22 21* 24  GLUCOSE 306* 301* 186* 239*  BUN _1 CREATININE 0.58 0.57 0.42* 0.43*  CALCIUM 8.8* 8.9 7.9* 7.9*  MG  --   --  1.5* 1.8  PHOS  --   --  1.9*  --    Liver Function Tests:  Recent Labs Lab 09/16/15 1626 09/17/15 0746  AST 15 14*  ALT 17 14  ALKPHOS 113 97  BILITOT 1.0 0.9  PROT 6.5 5.6*  ALBUMIN 2.6* 2.1*    Recent Labs Lab 09/16/15 1626  LIPASE 15   CBC:  Recent Labs Lab 09/13/15 1448 09/16/15 1626 09/17/15 0746 09/18/15 0631  WBC 9.9 13.1* 10.2 10.6*  NEUTROABS 8.6*  --   --   --   HGB 12.7 12.4 11.9* 10.9*  HCT 37.1 35.7* 33.8* 32.5*  MCV 86.1 84.2 84.7 85.3  PLT 241 294 291 321   Cardiac Enzymes:  Recent Labs Lab 09/16/15 1907  CKTOTAL 30*   CBG:  Recent Labs Lab 09/17/15 1039 09/17/15 1218 09/17/15 1659 09/17/15 2135 09/18/15 0834  GLUCAP 151* 149* 267* 177* 192*    Recent Results (from the past 240 hour(s))  Urine culture     Status: None   Collection Time: 09/13/15  2:58 PM  Result Value Ref Range Status   Specimen Description URINE, CLEAN CATCH  Final   Special Requests Normal  Final   Culture   Final    >=100,000 COLONIES/mL METHICILLIN  RESISTANT STAPHYLOCOCCUS AUREUS   Report Status 09/16/2015 FINAL  Final   Organism ID, Bacteria METHICILLIN RESISTANT STAPHYLOCOCCUS AUREUS  Final      Susceptibility   Methicillin resistant staphylococcus aureus - MIC*    CIPROFLOXACIN <=0.5 SENSITIVE Sensitive     GENTAMICIN <=0.5 SENSITIVE Sensitive     NITROFURANTOIN <=16 SENSITIVE Sensitive     OXACILLIN >=4 RESISTANT Resistant     TETRACYCLINE <=1 SENSITIVE Sensitive     VANCOMYCIN <=0.5 SENSITIVE Sensitive     TRIMETH/SULFA <=10 SENSITIVE Sensitive     CLINDAMYCIN <=0.25 SENSITIVE Sensitive     RIFAMPIN <=0.5 SENSITIVE Sensitive     Inducible Clindamycin NEGATIVE Sensitive     * >=100,000  COLONIES/mL METHICILLIN RESISTANT STAPHYLOCOCCUS AUREUS  Urine culture     Status: None (Preliminary result)   Collection Time: 09/16/15  4:37 PM  Result Value Ref Range Status   Specimen Description URINE, RANDOM  Final   Special Requests NONE  Final   Culture TOO YOUNG TO READ  Final   Report Status PENDING  Incomplete  Culture, blood (Routine X 2) w Reflex to ID Panel     Status: None (Preliminary result)   Collection Time: 09/17/15 12:00 PM  Result Value Ref Range Status   Specimen Description BLOOD LEFT ANTECUBITAL  Final   Special Requests BOTTLES DRAWN AEROBIC AND ANAEROBIC 10CC  Final   Culture  Setup Time   Final    GRAM POSITIVE COCCI IN CLUSTERS ANAEROBIC BOTTLE ONLY CRITICAL RESULT CALLED TO, READ BACK BY AND VERIFIED WITH: Star Age [id number] [place]    Culture NO GROWTH < 24 HOURS  Final   Report Status PENDING  Incomplete  Culture, blood (Routine X 2) w Reflex to ID Panel     Status: None (Preliminary result)   Collection Time: 09/17/15 12:15 PM  Result Value Ref Range Status   Specimen Description BLOOD BLOOD LEFT HAND  Final   Special Requests BOTTLES DRAWN AEROBIC AND ANAEROBIC 10CC  Final   Culture NO GROWTH < 24 HOURS  Final   Report Status PENDING  Incomplete      Studies/Results: Ct Renal Stone Study  09/16/2015  CLINICAL DATA:  Hematuria and groin pain. Difficulty with ambulation. EXAM: CT ABDOMEN AND PELVIS WITHOUT CONTRAST TECHNIQUE: Multidetector CT imaging of the abdomen and pelvis was performed following the standard protocol without IV contrast. COMPARISON:  Pelvic MRI 09/13/2015 FINDINGS: Lower chest: Mild motion artifact. Dependent atelectasis with trace pleural effusions. There are coronary artery calcifications. Heart is normal in size. Liver: Motion artifact inferiorly. Allowing for this and lack of intravenous contrast, no focal lesion is seen. Hepatobiliary: Gallbladder physiologically distended, no calcified stone. No biliary dilatation.  Pancreas: Atrophic.  No ductal dilatation or inflammation. Spleen: Normal. Adrenal glands: No nodule.  Mild thickening on the left. Kidneys: Motion artifact through the lower right kidney. No hydronephrosis or urolithiasis. Mild nonspecific bilateral perinephric edema. Stomach/Bowel: Stomach physiologically distended. There are no dilated or thickened small bowel loops. Moderate volume of stool throughout the colon without colonic wall thickening. The appendix is normal. Vascular/Lymphatic: No retroperitoneal adenopathy, portions obscured by motion. Abdominal aorta is normal in caliber. Moderate atherosclerosis without aneurysm. Reproductive: Uterus remains in situ.  No evidence of adnexal mass. Bladder: Mildly distended but with diffuse wall thickening. Mild perivesicular edema. Other: No free air, free fluid, or intra-abdominal fluid collection. Musculoskeletal: Soft tissue stranding about iliacus muscle and left hip adductor musculature, better appreciated on MRI. The additional areas of muscle edema  are not as well characterized. No soft tissue air or developing fluid collection allowing for noncontrast exam. There are no acute or suspicious osseous abnormalities. 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 No bony destructive change. IMPRESSION: 1. Urinary bladder wall thickening and perivesicular edema, suggestive urinary tract infection. No urolithiasis or hydronephrosis. 2. Left iliopsoas and hip adductor muscle edema, better appreciated on prior MRI. No developing fluid collections or soft tissue air. Electronically Signed   By: Jeb Levering M.D.   On: 09/16/2015 22:46    Medications:  Scheduled: . cefTRIAXone (ROCEPHIN)  IV  1 g Intravenous Q24H  . enoxaparin (LOVENOX) injection  20 mg Subcutaneous Q24H  . insulin aspart  0-15 Units Subcutaneous TID WC  . insulin aspart  0-5 Units Subcutaneous QHS  . insulin glargine  10 Units Subcutaneous QHS   . vancomycin  1,000 mg Intravenous Q0600   Continuous: . sodium chloride 50 mL/hr at 09/17/15 1424   PVX:[id number] **OR** acetaminophen, HYDROcodone-acetaminophen, morphine injection, ondansetron **OR** ondansetron (ZOFRAN) IV, polyethylene glycol, traMADol  Assessment/Plan:  Active Problems:   Diabetes (HCC)   UTI (lower urinary tract infection)   Hip pain   Hypertension   Pain management    Left hip pain. Patient underwent MRI recently which showed abnormal appearance of the musculature including the iliopsoas and other muscle groups. There was a concern regarding myositis. No abscess was noted. CK level was normal. CRP is 27.1. ESR is 88. Patient was given a course of steroids with only minimal improvement. Seen by orthopedics this admission. No fractures identified on CT scan. Bedrest and pain control has been recommended by orthopedics. Involve PT and OT once cleared by orthopedics. In view of bacteremia and presence of MRSA in the urine, infectious myositis is a distinct possibility.  Bacteremia One out of 2 sets is noted to be growing gram-positive cocci in clusters. Patient was started on vancomycin yesterday. Await final identification. Patient was noted to have MRSA on urine culture reports from March 7. Echocardiogram is pending  Urinary tract infection Urine culture from March 7 revealed MRSA. Patient is currently on ceftriaxone. We will add doxycycline. Urine cultures from this hospitalization pending. CT scan of the abdomen and pelvis does not reveal any abscess.  History of essential hypertension Monitor blood pressures closely.   History of diabetes mellitus type 2, poorly controlled Continue SSI. Holding metformin and Amaryl. On Lantus.  Hypokalemia and hypomagnesemia This will be repleted.  Normocytic anemia Anemia panel reviewed. No clear evidence for iron deficiency.   DVT Prophylaxis: Lovenox    Code Status: Full code  Family Communication:  Discussed with the patient's family   Disposition Plan: Continue management as outlined above.    LOS: 1 day   [place] Pager [phone] 09/18/2015, 10:41 AM  If 7PM-7AM, please contact night-coverage at [email or web address], password [facility]

## 2015-09-19 ENCOUNTER — Inpatient Hospital Stay (HOSPITAL_COMMUNITY): Payer: MEDICAID

## 2015-09-19 DIAGNOSIS — B9561 Methicillin susceptible Staphylococcus aureus infection as the cause of diseases classified elsewhere: Secondary | ICD-10-CM

## 2015-09-19 DIAGNOSIS — R7881 Bacteremia: Secondary | ICD-10-CM | POA: Diagnosis present

## 2015-09-19 DIAGNOSIS — R011 Cardiac murmur, unspecified: Secondary | ICD-10-CM

## 2015-09-19 LAB — CBC
HEMATOCRIT: 32.6 % — AB (ref 36.0–46.0)
HEMOGLOBIN: 10.9 g/dL — AB (ref 12.0–15.0)
MCH: 29.1 pg (ref 26.0–34.0)
MCHC: 33.4 g/dL (ref 30.0–36.0)
MCV: 86.9 fL (ref 78.0–100.0)
Platelets: 373 10*3/uL (ref 150–400)
RBC: 3.75 MIL/uL — ABNORMAL LOW (ref 3.87–5.11)
RDW: 13.9 % (ref 11.5–15.5)
WBC: 9.7 10*3/uL (ref 4.0–10.5)

## 2015-09-19 LAB — BASIC METABOLIC PANEL
Anion gap: 10 (ref 5–15)
BUN: 7 mg/dL (ref 6–20)
CO2: 25 mmol/L (ref 22–32)
Calcium: 8 mg/dL — ABNORMAL LOW (ref 8.9–10.3)
Chloride: 100 mmol/L — ABNORMAL LOW (ref 101–111)
Glucose, Bld: 189 mg/dL — ABNORMAL HIGH (ref 65–99)
Potassium: 3.8 mmol/L (ref 3.5–5.1)
SODIUM: 135 mmol/L (ref 135–145)

## 2015-09-19 LAB — GLUCOSE, CAPILLARY
GLUCOSE-CAPILLARY: 142 mg/dL — AB (ref 65–99)
Glucose-Capillary: 179 mg/dL — ABNORMAL HIGH (ref 65–99)
Glucose-Capillary: 108 mg/dL — ABNORMAL HIGH (ref 65–99)
Glucose-Capillary: 113 mg/dL — ABNORMAL HIGH (ref 65–99)

## 2015-09-19 LAB — ECHOCARDIOGRAM COMPLETE

## 2015-09-19 LAB — MRSA PCR SCREENING: MRSA BY PCR: POSITIVE — AB

## 2015-09-19 NOTE — Progress Notes (Addendum)
[facility] PROGRESS NOTE  Emily Hoover BLT:[id number] DOB: 17-May-1946 DOA: 09/16/2015  PCP: No primary care provider on file.  Brief HPI: 70 year old female of Hispanic origin who doesn't speak much English with a past medical history of hypertension and diabetes, presented with the complaints of left hip pain ongoing for the last 1 week. She was diagnosed with myositis based on MRI. She was given steroids. Her pain persisted. She presented because of progressively worsening fatigue. In the ED evaluation revealed a urinary tract infection. She was hospitalized for further management.  Past medical history:  Past Medical History  Diagnosis Date  . Diabetes mellitus   . Hypertension     Consultants: Orthopedics  Procedures:  Echocardiogram Study Conclusions - Left ventricle: The cavity size was normal. There was moderate concentric hypertrophy. Systolic function was normal. The estimated ejection fraction was in the range of 60% to 65%. Wall motion was normal; there were no regional wall motion abnormalities. Doppler parameters are consistent with abnormal left ventricular relaxation (grade 1 diastolic dysfunction). - Aortic valve: Trileaflet; mildly thickened, mildly calcified leaflets. - Mitral valve: Moderately calcified annulus. - Tricuspid valve: There was mild regurgitation.  Antibiotics: Ceftriaxone and vancomycin  Subjective: Patient's daughter was able to interpret. Patient continues to have pain in her left thigh area, however, pain is improved compared to yesterday. Nausea is better. No vomiting. Appetite remains poor.   Objective: Vital Signs  Filed Vitals:   09/18/15 1839 09/18/15 2213 09/19/15 0007 09/19/15 0512  BP: 137/65 160/80 139/72 145/70  Pulse: 89 94 86 85  Temp: 97.5 F (36.4 C) 97.8 F (36.6 C) 97.7 F (36.5 C) 97.9 F (36.6 C)  TempSrc: Oral     Resp: 17 16 16 16   SpO2: 96% 96% 96% 97%    Intake/Output Summary (Last 24 hours) at  09/19/15 1040 Last data filed at 09/19/15 0528  Gross per 24 hour  Intake 1795.33 ml  Output      0 ml  Net 1795.33 ml   There were no vitals filed for this visit.  General appearance: alert, cooperative and no distress Resp: clear to auscultation bilaterally Cardio: S1, S2 is normal, regular. No S3, S4. Systolic murmur appreciated over the precordium. GI: soft, non-tender; bowel sounds normal; no masses,  no organomegaly Extremities: Restricted range of motion of the left hip. Slightly better today compared to yesterday. No obvious swelling is appreciated. Neurologic: Awake and alert. No focal neurological deficits.  Lab Results:  Basic Metabolic Panel:  Recent Labs Lab 09/13/15 1448 09/16/15 1626 09/17/15 0746 09/18/15 0631 09/19/15 0522  NA 133* 130* 134* 132* 135  K 3.8 3.8 3.2* 4.0 3.8  CL 98* 95* 103 100* 100*  CO2 25 22 21* 24 25  GLUCOSE 306* 301* 186* 239* 189*  BUN 16 13 10 9 7   CREATININE 0.58 0.57 0.42* 0.43* <0.30*  CALCIUM 8.8* 8.9 7.9* 7.9* 8.0*  MG  --   --  1.5* 1.8  --   PHOS  --   --  1.9*  --   --    Liver Function Tests:  Recent Labs Lab 09/16/15 1626 09/17/15 0746  AST 15 14*  ALT 17 14  ALKPHOS 113 97  BILITOT 1.0 0.9  PROT 6.5 5.6*  ALBUMIN 2.6* 2.1*    Recent Labs Lab 09/16/15 1626  LIPASE 15   CBC:  Recent Labs Lab 09/13/15 1448 09/16/15 1626 09/17/15 0746 09/18/15 0631 09/19/15 0522  WBC 9.9 13.1* 10.2 10.6* 9.7  NEUTROABS  8.6*  --   --   --   --   HGB 12.7 12.4 11.9* 10.9* 10.9*  HCT 37.1 35.7* 33.8* 32.5* 32.6*  MCV 86.1 84.2 84.7 85.3 86.9  PLT 241 294 291 321 373   Cardiac Enzymes:  Recent Labs Lab 09/16/15 1907  CKTOTAL 30*   CBG:  Recent Labs Lab 09/18/15 0834 09/18/15 1219 09/18/15 1633 09/18/15 2211 09/19/15 0824  GLUCAP 192* 174* 183* 190* 179*    Recent Results (from the past 240 hour(s))  Urine culture     Status: None   Collection Time: 09/13/15  2:58 PM  Result Value Ref Range Status    Specimen Description URINE, CLEAN CATCH  Final   Special Requests Normal  Final   Culture   Final    >=100,000 COLONIES/mL METHICILLIN RESISTANT STAPHYLOCOCCUS AUREUS   Report Status 09/16/2015 FINAL  Final   Organism ID, Bacteria METHICILLIN RESISTANT STAPHYLOCOCCUS AUREUS  Final      Susceptibility   Methicillin resistant staphylococcus aureus - MIC*    CIPROFLOXACIN <=0.5 SENSITIVE Sensitive     GENTAMICIN <=0.5 SENSITIVE Sensitive     NITROFURANTOIN <=16 SENSITIVE Sensitive     OXACILLIN >=4 RESISTANT Resistant     TETRACYCLINE <=1 SENSITIVE Sensitive     VANCOMYCIN <=0.5 SENSITIVE Sensitive     TRIMETH/SULFA <=10 SENSITIVE Sensitive     CLINDAMYCIN <=0.25 SENSITIVE Sensitive     RIFAMPIN <=0.5 SENSITIVE Sensitive     Inducible Clindamycin NEGATIVE Sensitive     * >=100,000 COLONIES/mL METHICILLIN RESISTANT STAPHYLOCOCCUS AUREUS  Urine culture     Status: None   Collection Time: 09/16/15  4:37 PM  Result Value Ref Range Status   Specimen Description URINE, RANDOM  Final   Special Requests NONE  Final   Culture MULTIPLE SPECIES PRESENT, SUGGEST RECOLLECTION  Final   Report Status 09/18/2015 FINAL  Final  Culture, blood (Routine X 2) w Reflex to ID Panel     Status: None (Preliminary result)   Collection Time: 09/17/15 12:00 PM  Result Value Ref Range Status   Specimen Description BLOOD LEFT ANTECUBITAL  Final   Special Requests BOTTLES DRAWN AEROBIC AND ANAEROBIC 10CC  Final   Culture  Setup Time   Final    GRAM POSITIVE COCCI IN CLUSTERS ANAEROBIC BOTTLE ONLY CRITICAL RESULT CALLED TO, READ BACK BY AND VERIFIED WITH: Star Age [id number] [place]    Culture NO GROWTH < 24 HOURS  Final   Report Status PENDING  Incomplete  Culture, blood (Routine X 2) w Reflex to ID Panel     Status: None (Preliminary result)   Collection Time: 09/17/15 12:15 PM  Result Value Ref Range Status   Specimen Description BLOOD BLOOD LEFT HAND  Final   Special Requests BOTTLES DRAWN  AEROBIC AND ANAEROBIC 10CC  Final   Culture NO GROWTH < 24 HOURS  Final   Report Status PENDING  Incomplete  MRSA PCR Screening     Status: Abnormal   Collection Time: 09/19/15  7:34 AM  Result Value Ref Range Status   MRSA by PCR POSITIVE (A) NEGATIVE Final    Comment:        The GeneXpert MRSA Assay (FDA approved for NASAL specimens only), is one component of a comprehensive MRSA colonization surveillance program. It is not intended to diagnose MRSA infection nor to guide or monitor treatment for MRSA infections. RESULT CALLED TO, READ BACK BY AND VERIFIED WITH: Jefferson Fuel RN 9:40 09/19/15 ([email or web address])  Studies/Results: No results found.  Medications:  Scheduled: . cefTRIAXone (ROCEPHIN)  IV  2 g Intravenous Q24H  . enoxaparin (LOVENOX) injection  20 mg Subcutaneous Q24H  . Influenza vac split quadrivalent PF  0.5 mL Intramuscular Tomorrow-1000  . insulin aspart  0-15 Units Subcutaneous TID WC  . insulin aspart  0-5 Units Subcutaneous QHS  . insulin glargine  10 Units Subcutaneous QHS  . vancomycin  1,000 mg Intravenous Q0600   Continuous: . sodium chloride 50 mL/hr at 09/18/15 1508   MIM:[id number] **OR** acetaminophen, HYDROcodone-acetaminophen, morphine injection, ondansetron **OR** ondansetron (ZOFRAN) IV, polyethylene glycol, traMADol  Assessment/Plan:  Active Problems:   Diabetes (HCC)   UTI (lower urinary tract infection)   Hip pain   Hypertension   Pain management    Left hip pain. Patient underwent MRI recently which showed abnormal appearance of the musculature including the iliopsoas and other muscle groups. There was a concern regarding myositis. No abscess was noted. In view of bacteremia and presence of MRSA in the urine, infectious myositis is a distinct possibility. CK level was normal. CRP is 27.1. ESR is 88. Patient was given a course of steroids with only minimal improvement. Seen by orthopedics this admission. No fractures identified on CT  scan. Bedrest was initially recommended. Now activities has been approved by orthopedics. PT and OT to be consulted.  Bacteremia One out of 2 sets is noted to be growing gram-positive cocci in clusters. Patient was started on vancomycin. Await final identification. Patient was noted to have MRSA on urine culture reports from March 7. Once final identification is available, we may need to consult ID. Echocardiogram report is as above. No concerning findings appreciated.  Urinary tract infection Urine culture from March 7 revealed MRSA. Patient is currently on ceftriaxone. Urine cultures from this hospitalization showed multiple species. CT scan of the abdomen and pelvis does not reveal any abscess.  History of essential hypertension Monitor blood pressures closely.   History of diabetes mellitus type 2, poorly controlled Continue SSI. Holding metformin and Amaryl. On Lantus during this hospitalization. Check HbA1c  Hypokalemia and hypomagnesemia Repleted.  Normocytic anemia Anemia panel reviewed. No clear evidence for iron deficiency.   DVT Prophylaxis: Lovenox    Code Status: Full code  Family Communication: Discussed with the patient's family   Disposition Plan: Continue management as outlined above.    LOS: 2 days   [facility] Pager [phone] 09/19/2015, 10:40 AM  If 7PM-7AM, please contact night-coverage at [email or web address], password [facility]

## 2015-09-19 NOTE — Consult Note (Signed)
[place] for Infectious Disease    Date of Admission:  09/16/2015           Ceftriaxone 3/10>>3/12        Doxycycline 3/11>>3/12        Vancomycin 3/12 >>       Reason for Consult: MRSA bacteremia    Referring Physician: Dr. [place] Pink   Principal Problem:   Staphylococcus aureus bacteremia Active Problems:   Diabetes [facility])   UTI (lower urinary tract infection)   Hip pain   Hypertension   Pain management   . enoxaparin (LOVENOX) injection  20 mg Subcutaneous Q24H  . Influenza vac split quadrivalent PF  0.5 mL Intramuscular Tomorrow-1000  . insulin aspart  0-15 Units Subcutaneous TID WC  . insulin aspart  0-5 Units Subcutaneous QHS  . insulin glargine  10 Units Subcutaneous QHS  . vancomycin  1,000 mg Intravenous Q0600    Recommendations: 1. Continue vancomycin dosing per pharmacy 2. Follow up final identification of Staph aureus blood culture from 3/11 3. Follow up repeat blood cultures drawn 3/13 4. Will review guidelines to determine if TEE is needed given her presence of Staph in blood, urine, and possible musculature of left hip  Assessment: Emily Hoover is here with about 10 days prior to admission of worsening left hip and groin pain that left her being unable to ambulate without assistance.  She had an MRI on 09/13/15 that showed abnormal appearance of the musculature of the hip with concern for myositis although patient did not have any fevers and had only a mild leukocytosis on 09/16/15.  Routine urinalysis and culture obtained 09/13/15 grew MRSA and anaerobic bottle in 1 set of blood cultures from 09/17/15 grew Staph aureus (final identification pending).  Due to this, there was concern for pyomyositis and she was started on vancomycin after receiving ceftriaxone and doxycycline since 3/10.   Her CK was normal, ESR and CRP were elevated   HPI: Emily Hoover is a 70 y.o. female with history of diabetes mellitus and hypertension.  She is Spanish-speaking and HPI  obtained with assistance of the language interpreter phone.  She is here because of left hip pain for approximately 10 days prior to admission.   She was initially evaluated in the ED on 09/13/15 for her left hip pain where MRI showed unusual appearance of abnormal muscular edema involving the iliopsoas, left hip adductor musculature, and proximal left vastus musculature, but without underlying bony abnormality.  There was no evidence of abscess and she was discharged home.  She came back to the ED on 3/10 with worsening groin and hip pain being unable to ambulate.  She also began to have worsening hematuria, fatigue, and dysuria.  She was admitted to the hospital.  CT renal stone study this admission showed urinary bladder thickening with no urolithiasis or hydronephrosis.  This study also showed left iliopsoas and hip adductor muscle edema with no developing fluid collections or soft tissue air.  Orthopedics evaluated patient and felt symptoms were most likely due to myositis with no signs of a septic hip clinically or by imaging.  A urine culture from initial ED visit grew MRSA and repeat culture this admission with multiple species.  She had blood cultures drawn on 3/11 with Staph aureus growing in anaerobic bottle of one set.  The second set is no growth at 2 days.  Repeat blood cultures drawn this morning are currently processing.  She had a transthoracic  echocardiogram on 09/19/2015 that did not show any vegetations.  She reports today that her hip and groin pain are still present but is a little better.  She states she is still having some nausea but not vomiting.  She remains fatigued and states has a poor appetite.  She has no abdominal pain and groin pain continues with urination.  She states the hematuria has improved.  She had a leukocytosis of 13 on admission that is improved to 9.7 today.  She has remained afebrile throughout course of admission.   Review of Systems: Review of Systems    Constitutional: Positive for malaise/fatigue. Negative for fever and chills.  Respiratory: Negative for cough and shortness of breath.   Cardiovascular: Negative for chest pain.  Gastrointestinal: Positive for nausea. Negative for vomiting, abdominal pain and diarrhea.  Genitourinary: Positive for dysuria and hematuria.  Musculoskeletal: Positive for joint pain.  Skin: Negative for rash.  Neurological: Positive for weakness.    Past Medical History  Diagnosis Date  . Diabetes mellitus   . Hypertension     Social History  Substance Use Topics  . Smoking status: Never Smoker   . Smokeless tobacco: None  . Alcohol Use: No    Family History  Problem Relation Age of Onset  . CAD Mother   . CAD Father   . Hypertension Father   . Diabetes Neg Hx   . Cancer Neg Hx    No Known Allergies  OBJECTIVE: Blood pressure 143/67, pulse 86, temperature 98.8 F (37.1 C), temperature source Oral, resp. rate 17, SpO2 95 %.  Physical Exam  Constitutional:  Frail and uncomfortable appearing, Spanish speaking female  HENT:  Head: Normocephalic and atraumatic.  Eyes: Conjunctivae and EOM are normal.  Neck: Normal range of motion.  Cardiovascular: Normal rate, regular rhythm and normal heart sounds.   Pulmonary/Chest: Effort normal and breath sounds normal. She has no wheezes.  Abdominal: Soft. She exhibits no distension. There is no tenderness. There is no rebound.  Musculoskeletal:  She has tenderness of her left thigh/hip area with no evidence of swelling or erythema. She has decreased ROM with passive flexion of left hip due to pain She has normal ROM of right hip Sensation to light touch intact  Neurological: She is alert.  She is oriented to being in the hospital in [place] and to her name.  She does not know the year.  Skin: Skin is warm and dry. No rash noted.    Lab Results Lab Results  Component Value Date   WBC 9.7 09/19/2015   HGB 10.9* 09/19/2015   HCT 32.6* 09/19/2015    MCV 86.9 09/19/2015   PLT 373 09/19/2015    Lab Results  Component Value Date   CREATININE <0.30* 09/19/2015   BUN 7 09/19/2015   NA 135 09/19/2015   K 3.8 09/19/2015   CL 100* 09/19/2015   CO2 25 09/19/2015    Lab Results  Component Value Date   ALT 14 09/17/2015   AST 14* 09/17/2015   ALKPHOS 97 09/17/2015   BILITOT 0.9 09/17/2015     Microbiology: Recent Results (from the past 240 hour(s))  Urine culture     Status: None   Collection Time: 09/13/15  2:58 PM  Result Value Ref Range Status   Specimen Description URINE, CLEAN CATCH  Final   Special Requests Normal  Final   Culture   Final    >=100,000 COLONIES/mL METHICILLIN RESISTANT STAPHYLOCOCCUS AUREUS   Report Status 09/16/2015 FINAL  Final   Organism ID, Bacteria METHICILLIN RESISTANT STAPHYLOCOCCUS AUREUS  Final      Susceptibility   Methicillin resistant staphylococcus aureus - MIC*    CIPROFLOXACIN <=0.5 SENSITIVE Sensitive     GENTAMICIN <=0.5 SENSITIVE Sensitive     NITROFURANTOIN <=16 SENSITIVE Sensitive     OXACILLIN >=4 RESISTANT Resistant     TETRACYCLINE <=1 SENSITIVE Sensitive     VANCOMYCIN <=0.5 SENSITIVE Sensitive     TRIMETH/SULFA <=10 SENSITIVE Sensitive     CLINDAMYCIN <=0.25 SENSITIVE Sensitive     RIFAMPIN <=0.5 SENSITIVE Sensitive     Inducible Clindamycin NEGATIVE Sensitive     * >=100,000 COLONIES/mL METHICILLIN RESISTANT STAPHYLOCOCCUS AUREUS  Urine culture     Status: None   Collection Time: 09/16/15  4:37 PM  Result Value Ref Range Status   Specimen Description URINE, RANDOM  Final   Special Requests NONE  Final   Culture MULTIPLE SPECIES PRESENT, SUGGEST RECOLLECTION  Final   Report Status 09/18/2015 FINAL  Final  Culture, blood (Routine X 2) w Reflex to ID Panel     Status: None (Preliminary result)   Collection Time: 09/17/15 12:00 PM  Result Value Ref Range Status   Specimen Description BLOOD LEFT ANTECUBITAL  Final   Special Requests BOTTLES DRAWN AEROBIC AND ANAEROBIC 10CC   Final   Culture  Setup Time   Final    GRAM POSITIVE COCCI IN CLUSTERS ANAEROBIC BOTTLE ONLY CRITICAL RESULT CALLED TO, READ BACK BY AND VERIFIED WITH: Star Age [id number] [place]    Culture STAPHYLOCOCCUS AUREUS  Final   Report Status PENDING  Incomplete  Culture, blood (Routine X 2) w Reflex to ID Panel     Status: None (Preliminary result)   Collection Time: 09/17/15 12:15 PM  Result Value Ref Range Status   Specimen Description BLOOD BLOOD LEFT HAND  Final   Special Requests BOTTLES DRAWN AEROBIC AND ANAEROBIC 10CC  Final   Culture NO GROWTH 2 DAYS  Final   Report Status PENDING  Incomplete  MRSA PCR Screening     Status: Abnormal   Collection Time: 09/19/15  7:34 AM  Result Value Ref Range Status   MRSA by PCR POSITIVE (A) NEGATIVE Final    Comment:        The GeneXpert MRSA Assay (FDA approved for NASAL specimens only), is one component of a comprehensive MRSA colonization surveillance program. It is not intended to diagnose MRSA infection nor to guide or monitor treatment for MRSA infections. RESULT CALLED TO, READ BACK BY AND VERIFIED WITH: Jefferson Fuel RN 9:40 09/19/15 ([email or web address])     Jule Ser, DO   09/19/2015, 3:43 PM  Addendum: I have seen and examined Emily Hoover and discussed her management with Dr. Juleen China. I agree with his assessments and plans. 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 She has no evidence of endocarditis by clinical exam or transthoracic echocardiogram. I will continue her on vancomycin. We need to wait on PICC placement until we know repeat blood cultures are negative. If only one blood culture is positive she may not need a transesophageal echocardiogram.  Michel Bickers, MD [facility] for [place] [phone] pager   [phone] cell 09/19/2015, 5:21 PM

## 2015-09-19 NOTE — Progress Notes (Signed)
Echocardiogram 2D Echocardiogram has been performed.  Emily Hoover 09/19/2015, 10:37 AM

## 2015-09-20 DIAGNOSIS — B9562 Methicillin resistant Staphylococcus aureus infection as the cause of diseases classified elsewhere: Secondary | ICD-10-CM

## 2015-09-20 LAB — CBC
HCT: 33.4 % — ABNORMAL LOW (ref 36.0–46.0)
Hemoglobin: 11 g/dL — ABNORMAL LOW (ref 12.0–15.0)
MCH: 28.4 pg (ref 26.0–34.0)
MCHC: 32.9 g/dL (ref 30.0–36.0)
MCV: 86.3 fL (ref 78.0–100.0)
PLATELETS: 428 10*3/uL — AB (ref 150–400)
RBC: 3.87 MIL/uL (ref 3.87–5.11)
RDW: 13.7 % (ref 11.5–15.5)
WBC: 12.3 10*3/uL — AB (ref 4.0–10.5)

## 2015-09-20 LAB — BASIC METABOLIC PANEL
Anion gap: 7 (ref 5–15)
CALCIUM: 8 mg/dL — AB (ref 8.9–10.3)
CO2: 27 mmol/L (ref 22–32)
CREATININE: 0.32 mg/dL — AB (ref 0.44–1.00)
Chloride: 103 mmol/L (ref 101–111)
GFR calc non Af Amer: >60 mL/min (ref >60)
Glucose, Bld: 119 mg/dL — ABNORMAL HIGH (ref 65–99)
Potassium: 3.3 mmol/L — ABNORMAL LOW (ref 3.5–5.1)
SODIUM: 137 mmol/L (ref 135–145)

## 2015-09-20 LAB — GLUCOSE, CAPILLARY
GLUCOSE-CAPILLARY: 118 mg/dL — AB (ref 65–99)
Glucose-Capillary: 133 mg/dL — ABNORMAL HIGH (ref 65–99)
Glucose-Capillary: 91 mg/dL (ref 65–99)
Glucose-Capillary: 106 mg/dL — ABNORMAL HIGH (ref 65–99)

## 2015-09-20 LAB — CULTURE, BLOOD (ROUTINE X 2)

## 2015-09-20 MED ORDER — POTASSIUM CHLORIDE CRYS ER 20 MEQ PO TBCR
40.0000 meq | EXTENDED_RELEASE_TABLET | Freq: Once | ORAL | Status: AC
  Administered 2015-09-20: 40 meq via ORAL
  Filled 2015-09-20: qty 2

## 2015-09-20 NOTE — Progress Notes (Signed)
OT Cancellation Note  Patient Details Name: Emily Hoover MRN: [id number] DOB: [birth year]   Cancelled Treatment:    Reason Eval/Treat Not Completed: Medical issues which prohibited therapy (c/o nausea). Will attempt again tomorrow.   [place], OTR/L  [id number] 09/20/2015 09/20/2015, 5:02 PM

## 2015-09-20 NOTE — Progress Notes (Signed)
PT Cancellation Note  Patient Details Name: Emily Hoover MRN: [id number] DOB: 19-Jul-1945   Cancelled Treatment:    Reason Eval/Treat Not Completed: Medical issues which prohibited therapy (Pt with nausea). Will try again tomorrow.   Jetson Pickrel 09/20/2015, 3:37 PM [facility]

## 2015-09-20 NOTE — Progress Notes (Addendum)
[facility] PROGRESS NOTE  Emily Hoover [id number] DOB: 1945-10-27 DOA: 09/16/2015  PCP: No primary care provider on file.  Brief HPI: 70 year old female of Hispanic origin who doesn't speak much English with a past medical history of hypertension and diabetes, presented with the complaints of left hip pain ongoing for the last 1 week. She was diagnosed with myositis based on MRI. She was given steroids. Her pain persisted. She presented because of progressively worsening fatigue. In the ED evaluation revealed a urinary tract infection. She was hospitalized for further management. She was subsequently noted to be bacteremic with MRSA. ID was consulted.  Past medical history:  Past Medical History  Diagnosis Date  . Diabetes mellitus   . Hypertension     Consultants: Orthopedics. Infectious disease  Procedures:  Echocardiogram Study Conclusions - Left ventricle: The cavity size was normal. There was moderate concentric hypertrophy. Systolic function was normal. The estimated ejection fraction was in the range of 60% to 65%. Wall motion was normal; there were no regional wall motion abnormalities. Doppler parameters are consistent with abnormal left ventricular relaxation (grade 1 diastolic dysfunction). - Aortic valve: Trileaflet; mildly thickened, mildly calcified leaflets. - Mitral valve: Moderately calcified annulus. - Tricuspid valve: There was mild regurgitation.  Antibiotics: Ceftriaxone and vancomycin Ceftriaxone discontinued.  Subjective: Patient's daughter was able to interpret. Patient denies any change from yesterday. Continues to have pain in the left thigh and groin area.    Objective: Vital Signs  Filed Vitals:   09/19/15 1420 09/19/15 2104 09/20/15 0632 09/20/15 1310  BP: 143/67 133/75 160/74 146/71  Pulse: 86 93 86 86  Temp: 98.8 F (37.1 C) 98.7 F (37.1 C) 98.5 F (36.9 C) 98.6 F (37 C)  TempSrc: Oral Oral Oral Oral  Resp: _0 SpO2: 95% 92% 94% 94%    Intake/Output Summary (Last 24 hours) at 09/20/15 1448 Last data filed at 09/20/15 0000  Gross per 24 hour  Intake 1126.67 ml  Output      0 ml  Net 1126.67 ml   There were no vitals filed for this visit.  General appearance: alert, cooperative and no distress Resp: clear to auscultation bilaterally Cardio: S1, S2 is normal, regular. No S3, S4. Systolic murmur appreciated over the precordium. GI: soft, non-tender; bowel sounds normal; no masses,  no organomegaly Extremities: Restricted range of motion of the left hip. No obvious swelling is appreciated. Neurologic: Awake and alert. No focal neurological deficits.  Lab Results:  Basic Metabolic Panel:  Recent Labs Lab 09/16/15 1626 09/17/15 0746 09/18/15 0631 09/19/15 0522 09/20/15 0635  NA 130* 134* 132* 135 137  K 3.8 3.2* 4.0 3.8 3.3*  CL 95* 103 100* 100* 103  CO2 22 21* _1 GLUCOSE 301* 186* 239* 189* 119*  BUN _2 <5*  CREATININE 0.57 0.42* 0.43* <0.30* 0.32*  CALCIUM 8.9 7.9* 7.9* 8.0* 8.0*  MG  --  1.5* 1.8  --   --   PHOS  --  1.9*  --   --   --    Liver Function Tests:  Recent Labs Lab 09/16/15 1626 09/17/15 0746  AST 15 14*  ALT 17 14  ALKPHOS 113 97  BILITOT 1.0 0.9  PROT 6.5 5.6*  ALBUMIN 2.6* 2.1*    Recent Labs Lab 09/16/15 1626  LIPASE 15   CBC:  Recent Labs Lab 09/16/15 1626 09/17/15 0746 09/18/15 0631 09/19/15 0522 09/20/15 0635  WBC 13.1* 10.2 10.6*  9.7 12.3*  HGB 12.4 11.9* 10.9* 10.9* 11.0*  HCT 35.7* 33.8* 32.5* 32.6* 33.4*  MCV 84.2 84.7 85.3 86.9 86.3  PLT 294 291 321 373 428*   Cardiac Enzymes:  Recent Labs Lab 09/16/15 1907  CKTOTAL 30*   CBG:  Recent Labs Lab 09/19/15 1151 09/19/15 1720 09/19/15 2103 09/20/15 0820 09/20/15 1243  GLUCAP 108* 113* 142* 118* 133*    Recent Results (from the past 240 hour(s))  Urine culture     Status: None   Collection Time: 09/13/15  2:58 PM  Result Value Ref Range Status    Specimen Description URINE, CLEAN CATCH  Final   Special Requests Normal  Final   Culture   Final    >=100,000 COLONIES/mL METHICILLIN RESISTANT STAPHYLOCOCCUS AUREUS   Report Status 09/16/2015 FINAL  Final   Organism ID, Bacteria METHICILLIN RESISTANT STAPHYLOCOCCUS AUREUS  Final      Susceptibility   Methicillin resistant staphylococcus aureus - MIC*    CIPROFLOXACIN <=0.5 SENSITIVE Sensitive     GENTAMICIN <=0.5 SENSITIVE Sensitive     NITROFURANTOIN <=16 SENSITIVE Sensitive     OXACILLIN >=4 RESISTANT Resistant     TETRACYCLINE <=1 SENSITIVE Sensitive     VANCOMYCIN <=0.5 SENSITIVE Sensitive     TRIMETH/SULFA <=10 SENSITIVE Sensitive     CLINDAMYCIN <=0.25 SENSITIVE Sensitive     RIFAMPIN <=0.5 SENSITIVE Sensitive     Inducible Clindamycin NEGATIVE Sensitive     * >=100,000 COLONIES/mL METHICILLIN RESISTANT STAPHYLOCOCCUS AUREUS  Urine culture     Status: None   Collection Time: 09/16/15  4:37 PM  Result Value Ref Range Status   Specimen Description URINE, RANDOM  Final   Special Requests NONE  Final   Culture MULTIPLE SPECIES PRESENT, SUGGEST RECOLLECTION  Final   Report Status 09/18/2015 FINAL  Final  Culture, blood (Routine X 2) w Reflex to ID Panel     Status: None   Collection Time: 09/17/15 12:00 PM  Result Value Ref Range Status   Specimen Description BLOOD LEFT ANTECUBITAL  Final   Special Requests BOTTLES DRAWN AEROBIC AND ANAEROBIC 10CC  Final   Culture  Setup Time   Final    GRAM POSITIVE COCCI IN CLUSTERS ANAEROBIC BOTTLE ONLY CRITICAL RESULT CALLED TO, READ BACK BY AND VERIFIED WITH: Star Age [id number] [place]    Culture METHICILLIN RESISTANT STAPHYLOCOCCUS AUREUS  Final   Report Status 09/20/2015 FINAL  Final   Organism ID, Bacteria METHICILLIN RESISTANT STAPHYLOCOCCUS AUREUS  Final      Susceptibility   Methicillin resistant staphylococcus aureus - MIC*    CIPROFLOXACIN <=0.5 SENSITIVE Sensitive     ERYTHROMYCIN <=0.25 SENSITIVE Sensitive      GENTAMICIN <=0.5 SENSITIVE Sensitive     OXACILLIN >=4 RESISTANT Resistant     TETRACYCLINE <=1 SENSITIVE Sensitive     VANCOMYCIN <=0.5 SENSITIVE Sensitive     TRIMETH/SULFA <=10 SENSITIVE Sensitive     CLINDAMYCIN <=0.25 SENSITIVE Sensitive     RIFAMPIN <=0.5 SENSITIVE Sensitive     Inducible Clindamycin NEGATIVE Sensitive     * METHICILLIN RESISTANT STAPHYLOCOCCUS AUREUS  Culture, blood (Routine X 2) w Reflex to ID Panel     Status: None (Preliminary result)   Collection Time: 09/17/15 12:15 PM  Result Value Ref Range Status   Specimen Description BLOOD BLOOD LEFT HAND  Final   Special Requests BOTTLES DRAWN AEROBIC AND ANAEROBIC 10CC  Final   Culture NO GROWTH 3 DAYS  Final   Report Status PENDING  Incomplete  MRSA PCR Screening     Status: Abnormal   Collection Time: 09/19/15  7:34 AM  Result Value Ref Range Status   MRSA by PCR POSITIVE (A) NEGATIVE Final    Comment:        The GeneXpert MRSA Assay (FDA approved for NASAL specimens only), is one component of a comprehensive MRSA colonization surveillance program. It is not intended to diagnose MRSA infection nor to guide or monitor treatment for MRSA infections. RESULT CALLED TO, READ BACK BY AND VERIFIED WITH: Jefferson Fuel RN 9:40 09/19/15 ([email or web address])   Culture, blood (routine x 2)     Status: None (Preliminary result)   Collection Time: 09/19/15 12:45 PM  Result Value Ref Range Status   Specimen Description BLOOD LEFT HAND  Final   Special Requests IN PEDIATRIC BOTTLE 2CC  Final   Culture NO GROWTH < 24 HOURS  Final   Report Status PENDING  Incomplete  Culture, blood (routine x 2)     Status: None (Preliminary result)   Collection Time: 09/19/15 12:55 PM  Result Value Ref Range Status   Specimen Description BLOOD LEFT HAND  Final   Special Requests IN PEDIATRIC BOTTLE 2.5CC  Final   Culture NO GROWTH < 24 HOURS  Final   Report Status PENDING  Incomplete      Studies/Results: No results found.  Medications:    Scheduled: . enoxaparin (LOVENOX) injection  20 mg Subcutaneous Q24H  . Influenza vac split quadrivalent PF  0.5 mL Intramuscular Tomorrow-1000  . insulin aspart  0-15 Units Subcutaneous TID WC  . insulin aspart  0-5 Units Subcutaneous QHS  . insulin glargine  10 Units Subcutaneous QHS  . vancomycin  1,000 mg Intravenous Q0600   Continuous: . sodium chloride 50 mL/hr at 09/20/15 6226   JFH:[id number] **OR** acetaminophen, HYDROcodone-acetaminophen, morphine injection, ondansetron **OR** ondansetron (ZOFRAN) IV, polyethylene glycol, traMADol  Assessment/Plan:  Principal Problem:   Staphylococcus aureus bacteremia Active Problems:   Diabetes ([place])   UTI (lower urinary tract infection)   Hip pain   Hypertension   Pain management    Left hip pain/infectious myositis Patient underwent MRI recently which showed abnormal appearance of the musculature including the iliopsoas and other muscle groups. There was a concern regarding myositis. No abscess was noted. In view of bacteremia and presence of MRSA in the urine, infectious myositis is a distinct possibility. CK level was normal. CRP is 27.1. ESR is 88. Patient was given a course of steroids with only minimal improvement. Seen by orthopedics this admission. No fractures identified on CT scan. PT and OT to mobilize and evaluate.  MRSA Bacteremia One out of 2 sets is noted to be growing MRSA. Continue vancomycin. Patient was noted to have MRSA on urine culture reports from March 7. Echocardiogram report is as above. No concerning findings appreciated. ID is following.  Urinary tract infection Urine culture from March 7 revealed MRSA. Patient was placed on ceftriaxone. Urine cultures from this hospitalization showed multiple species. Ceftriaxone has been discontinued. CT scan of the abdomen and pelvis does not reveal any abscess.  History of essential hypertension Monitor blood pressures closely.   History of diabetes mellitus type  2, poorly controlled Continue SSI. Holding metformin and Amaryl. On Lantus during this hospitalization. HbA1c is pending.  Hypokalemia and hypomagnesemia Repleted.  Normocytic anemia Anemia panel reviewed. No clear evidence for iron deficiency.   DVT Prophylaxis: Lovenox    Code Status: Full code  Family Communication: Discussed with the patient's family  Disposition Plan: Continue management as outlined above.    LOS: 3 days   [facility] Pager [phone] 09/20/2015, 2:48 PM  If 7PM-7AM, please contact night-coverage at [email or web address], password [facility]

## 2015-09-20 NOTE — Progress Notes (Signed)
[facility] for Infectious Disease    Date of Admission:  09/16/2015   Ceftriaxone 3/10>>3/12 Doxycycline 3/11>>3/12 Vancomycin 3/12 >>  Principal Problem:   Staphylococcus aureus bacteremia Active Problems:   Diabetes ([place])   UTI (lower urinary tract infection)   Hip pain   Hypertension   Pain management   . enoxaparin (LOVENOX) injection  20 mg Subcutaneous Q24H  . Influenza vac split quadrivalent PF  0.5 mL Intramuscular Tomorrow-1000  . insulin aspart  0-15 Units Subcutaneous TID WC  . insulin aspart  0-5 Units Subcutaneous QHS  . insulin glargine  10 Units Subcutaneous QHS  . vancomycin  1,000 mg Intravenous Q0600    SUBJECTIVE: Emily Hoover is feeling better today in terms of the pain in her left hip/groin area, but it is still present.  She reports some symptoms of dysuria.  She also states that she gets intermittently lightheaded when moving around in the bed.  She has not been able to stand yet.  She denies any cough, headache, back pain, or abdominal pain.  Review of Systems: Review of Systems  Constitutional: Negative for fever and chills.  Respiratory: Negative for cough and shortness of breath.   Cardiovascular: Negative for chest pain.  Gastrointestinal: Negative for nausea, vomiting, abdominal pain and diarrhea.  Genitourinary: Positive for dysuria. Negative for flank pain.  Musculoskeletal: Positive for joint pain. Negative for back pain.  Skin: Negative for rash.  Neurological: Positive for dizziness and weakness. Negative for loss of consciousness and headaches.    Past Medical History  Diagnosis Date  . Diabetes mellitus   . Hypertension     Social History  Substance Use Topics  . Smoking status: Never Smoker   . Smokeless tobacco: None  . Alcohol Use: No    Family  History  Problem Relation Age of Onset  . CAD Mother   . CAD Father   . Hypertension Father   . Diabetes Neg Hx   . Cancer Neg Hx    No Known Allergies  OBJECTIVE: Filed Vitals:   09/19/15 0512 09/19/15 1420 09/19/15 2104 09/20/15 0632  BP: 145/70 143/67 133/75 160/74  Pulse: 85 86 93 86  Temp: 97.9 F (36.6 C) 98.8 F (37.1 C) 98.7 F (37.1 C) 98.5 F (36.9 C)  TempSrc:  Oral Oral Oral  Resp: 16 17 20 18   SpO2: 97% 95% 92% 94%   There is no weight on file to calculate BMI.  Physical Exam  Constitutional:  Frail appearing, very pleasant, Spanish speaking woman.  HENT:  Head: Normocephalic and atraumatic.  Eyes: Conjunctivae and EOM are normal.  Neck: Normal range of motion.  Cardiovascular: Normal rate, regular rhythm and normal heart sounds.   No murmur heard. Pulmonary/Chest: Effort normal and breath sounds normal. She has no wheezes.  Abdominal: Soft. She exhibits no distension. There is no tenderness. There is no rebound and no guarding.  Musculoskeletal:  Normal range of motion with right hip. Decreased range of motion still present with left hip due to pain. Tender to palpation of musculature in left hip.  No obvious swelling or erythema.  No tenderness over trochanteric area.  Neurological: She is alert.  Skin: Skin is warm and dry.    Lab Results Lab Results  Component Value Date   WBC 12.3* 09/20/2015   HGB 11.0* 09/20/2015   HCT 33.4* 09/20/2015   MCV 86.3 09/20/2015   PLT 428* 09/20/2015    Lab Results  Component  Value Date   CREATININE 0.32* 09/20/2015   BUN <5* 09/20/2015   NA 137 09/20/2015   K 3.3* 09/20/2015   CL 103 09/20/2015   CO2 27 09/20/2015    Lab Results  Component Value Date   ALT 14 09/17/2015   AST 14* 09/17/2015   ALKPHOS 97 09/17/2015   BILITOT 0.9 09/17/2015     Microbiology: Recent Results (from the past 240 hour(s))  Urine culture     Status: None   Collection Time: 09/13/15  2:58 PM  Result Value Ref Range  Status   Specimen Description URINE, CLEAN CATCH  Final   Special Requests Normal  Final   Culture   Final    >=100,000 COLONIES/mL METHICILLIN RESISTANT STAPHYLOCOCCUS AUREUS   Report Status 09/16/2015 FINAL  Final   Organism ID, Bacteria METHICILLIN RESISTANT STAPHYLOCOCCUS AUREUS  Final      Susceptibility   Methicillin resistant staphylococcus aureus - MIC*    CIPROFLOXACIN <=0.5 SENSITIVE Sensitive     GENTAMICIN <=0.5 SENSITIVE Sensitive     NITROFURANTOIN <=16 SENSITIVE Sensitive     OXACILLIN >=4 RESISTANT Resistant     TETRACYCLINE <=1 SENSITIVE Sensitive     VANCOMYCIN <=0.5 SENSITIVE Sensitive     TRIMETH/SULFA <=10 SENSITIVE Sensitive     CLINDAMYCIN <=0.25 SENSITIVE Sensitive     RIFAMPIN <=0.5 SENSITIVE Sensitive     Inducible Clindamycin NEGATIVE Sensitive     * >=100,000 COLONIES/mL METHICILLIN RESISTANT STAPHYLOCOCCUS AUREUS  Urine culture     Status: None   Collection Time: 09/16/15  4:37 PM  Result Value Ref Range Status   Specimen Description URINE, RANDOM  Final   Special Requests NONE  Final   Culture MULTIPLE SPECIES PRESENT, SUGGEST RECOLLECTION  Final   Report Status 09/18/2015 FINAL  Final  Culture, blood (Routine X 2) w Reflex to ID Panel     Status: None   Collection Time: 09/17/15 12:00 PM  Result Value Ref Range Status   Specimen Description BLOOD LEFT ANTECUBITAL  Final   Special Requests BOTTLES DRAWN AEROBIC AND ANAEROBIC 10CC  Final   Culture  Setup Time   Final    GRAM POSITIVE COCCI IN CLUSTERS ANAEROBIC BOTTLE ONLY CRITICAL RESULT CALLED TO, READ BACK BY AND VERIFIED WITH: Star Age [id number] [place]    Culture METHICILLIN RESISTANT STAPHYLOCOCCUS AUREUS  Final   Report Status 09/20/2015 FINAL  Final   Organism ID, Bacteria METHICILLIN RESISTANT STAPHYLOCOCCUS AUREUS  Final      Susceptibility   Methicillin resistant staphylococcus aureus - MIC*    CIPROFLOXACIN <=0.5 SENSITIVE Sensitive     ERYTHROMYCIN <=0.25 SENSITIVE Sensitive      GENTAMICIN <=0.5 SENSITIVE Sensitive     OXACILLIN >=4 RESISTANT Resistant     TETRACYCLINE <=1 SENSITIVE Sensitive     VANCOMYCIN <=0.5 SENSITIVE Sensitive     TRIMETH/SULFA <=10 SENSITIVE Sensitive     CLINDAMYCIN <=0.25 SENSITIVE Sensitive     RIFAMPIN <=0.5 SENSITIVE Sensitive     Inducible Clindamycin NEGATIVE Sensitive     * METHICILLIN RESISTANT STAPHYLOCOCCUS AUREUS  Culture, blood (Routine X 2) w Reflex to ID Panel     Status: None (Preliminary result)   Collection Time: 09/17/15 12:15 PM  Result Value Ref Range Status   Specimen Description BLOOD BLOOD LEFT HAND  Final   Special Requests BOTTLES DRAWN AEROBIC AND ANAEROBIC 10CC  Final   Culture NO GROWTH 2 DAYS  Final   Report Status PENDING  Incomplete  MRSA PCR Screening  Status: Abnormal   Collection Time: 09/19/15  7:34 AM  Result Value Ref Range Status   MRSA by PCR POSITIVE (A) NEGATIVE Final    Comment:        The GeneXpert MRSA Assay (FDA approved for NASAL specimens only), is one component of a comprehensive MRSA colonization surveillance program. It is not intended to diagnose MRSA infection nor to guide or monitor treatment for MRSA infections. RESULT CALLED TO, READ BACK BY AND VERIFIED WITH: Jefferson Fuel RN 9:40 09/19/15 ([email or web address])      ASSESSMENT: Emily Hoover is a 70 year old Spanish speaking woman who is here with left groin/hip pain and MRSA in her urine culture.  She also has had 1 blood culture grow MRSA from 09/17/2015.  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  She currently has no evidence of endocarditis by transthoracic echo or stigmata of endocarditis on clinical exam.  She has remained afebrile, her WBC is slightly higher today, and her left hip pain, while still present, has improved.   Repeat blood cultures are processing.  Per UpToDate, "the duration of antimicrobial therapy should be tailored to clinical and radiographic improvement. Three  to four weeks of parenteral therapy is usually sufficient, although patients with extensive, multifocal, or poorly drained infection may warrant longer courses of therapy. The course of therapy for patients with other sequelae of bacteremia (such as endocarditis or osteomyelitis) should be adjusted based on the nature of infection at these other sites."   PLAN: 1. Continue vancomycin per pharmacy 2. Follow up 2nd set of blood cultures from 09/17/15.  If remains negative, may not need to pursue TEE 3. Follow up repeat blood cultures drawn 09/19/15 4. Follow clinical course to determine appropriate duration of therapy but would anticipate at least 3-4 weeks of intravenous therapy.  May need repeat imaging to assess improvement  Jule Ser, DO   09/20/2015, 11:22 AM  Addendum: I discussed Emily Hoover's management plan with Dr. Juleen China and agree with his assessment and plans. 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 I would hold off on PICC placement until we are certain repeat blood cultures are negative. I agree that she may not absolutely need a TEE.  Michel Bickers, MD [facility] for [facility] [phone] pager   [phone] cell 09/20/2015, 2:36 PM

## 2015-09-21 DIAGNOSIS — I10 Essential (primary) hypertension: Secondary | ICD-10-CM

## 2015-09-21 DIAGNOSIS — E0800 Diabetes mellitus due to underlying condition with hyperosmolarity without nonketotic hyperglycemic-hyperosmolar coma (NKHHC): Secondary | ICD-10-CM

## 2015-09-21 LAB — COMPREHENSIVE METABOLIC PANEL
ALK PHOS: 124 U/L (ref 38–126)
ALT: 20 U/L (ref 14–54)
AST: 31 U/L (ref 15–41)
Albumin: 1.7 g/dL — ABNORMAL LOW (ref 3.5–5.0)
Anion gap: 12 (ref 5–15)
BUN: <5 mg/dL — ABNORMAL LOW (ref 6–20)
CHLORIDE: 102 mmol/L (ref 101–111)
CO2: 26 mmol/L (ref 22–32)
Calcium: 8.4 mg/dL — ABNORMAL LOW (ref 8.9–10.3)
Creatinine, Ser: 0.44 mg/dL (ref 0.44–1.00)
GFR calc Af Amer: >60 mL/min (ref >60)
GFR calc non Af Amer: >60 mL/min (ref >60)
Glucose, Bld: 110 mg/dL — ABNORMAL HIGH (ref 65–99)
Potassium: 3.6 mmol/L (ref 3.5–5.1)
SODIUM: 140 mmol/L (ref 135–145)
Total Bilirubin: 0.7 mg/dL (ref 0.3–1.2)
Total Protein: 5.4 g/dL — ABNORMAL LOW (ref 6.5–8.1)

## 2015-09-21 LAB — MAGNESIUM: MAGNESIUM: 1.7 mg/dL (ref 1.7–2.4)

## 2015-09-21 LAB — HM DIABETES EYE EXAM

## 2015-09-21 LAB — GLUCOSE, CAPILLARY
GLUCOSE-CAPILLARY: 133 mg/dL — AB (ref 65–99)
GLUCOSE-CAPILLARY: 94 mg/dL (ref 65–99)
GLUCOSE-CAPILLARY: 114 mg/dL — AB (ref 65–99)
Glucose-Capillary: 164 mg/dL — ABNORMAL HIGH (ref 65–99)

## 2015-09-21 LAB — HEMOGLOBIN A1C
HEMOGLOBIN A1C: 13 % — AB (ref 4.8–5.6)
MEAN PLASMA GLUCOSE: 326 mg/dL

## 2015-09-21 LAB — VANCOMYCIN, TROUGH: Vancomycin Tr: 5 ug/mL — ABNORMAL LOW (ref 10.0–20.0)

## 2015-09-21 MED ORDER — GLUCERNA SHAKE PO LIQD
237.0000 mL | Freq: Three times a day (TID) | ORAL | Status: DC
Start: 2015-09-21 — End: 2015-09-22
  Administered 2015-09-21 – 2015-09-22 (×4): 237 mL via ORAL

## 2015-09-21 MED ORDER — POTASSIUM CHLORIDE IN NACL 40-0.9 MEQ/L-% IV SOLN
INTRAVENOUS | Status: DC
  Administered 2015-09-21 – 2015-09-22 (×3): 75 mL/h via INTRAVENOUS
  Filled 2015-09-21 (×4): qty 1000

## 2015-09-21 MED ORDER — MAGNESIUM SULFATE IN D5W 10-5 MG/ML-% IV SOLN
1.0000 g | Freq: Once | INTRAVENOUS | Status: AC
  Administered 2015-09-21: 1 g via INTRAVENOUS
  Filled 2015-09-21: qty 100

## 2015-09-21 MED ORDER — POTASSIUM CHLORIDE CRYS ER 20 MEQ PO TBCR
40.0000 meq | EXTENDED_RELEASE_TABLET | Freq: Once | ORAL | Status: AC
  Administered 2015-09-21: 40 meq via ORAL
  Filled 2015-09-21: qty 2

## 2015-09-21 MED ORDER — SODIUM CHLORIDE 0.9% FLUSH
10.0000 mL | INTRAVENOUS | Status: DC | PRN
  Administered 2015-09-22 (×2): 10 mL
  Filled 2015-09-21 (×2): qty 40

## 2015-09-21 MED ORDER — VANCOMYCIN HCL IN DEXTROSE 750-5 MG/150ML-% IV SOLN
750.0000 mg | Freq: Two times a day (BID) | INTRAVENOUS | Status: DC
  Administered 2015-09-21 – 2015-09-22 (×3): 750 mg via INTRAVENOUS
  Filled 2015-09-21 (×4): qty 150

## 2015-09-21 NOTE — Progress Notes (Signed)
Initial Nutrition Assessment  DOCUMENTATION CODES:   Not applicable  INTERVENTION:   -Glucerna Shake po TID, each supplement provides 220 kcal and 10 grams of protein  NUTRITION DIAGNOSIS:   Inadequate oral intake related to poor appetite as evidenced by meal completion < 25%.  GOAL:   Patient will meet greater than or equal to 90% of their needs  MONITOR:   PO intake, Supplement acceptance, I & O's, Labs, Weight trends, Skin  REASON FOR ASSESSMENT:   Consult Assessment of nutrition requirement/status  ASSESSMENT:   70 year old female of Hispanic origin who doesn't speak much English with a past medical history of hypertension and diabetes, presented with the complaints of left hip pain ongoing for the last 1 week. She was diagnosed with myositis based on MRI. She was given steroids. Her pain persisted. She presented because of progressively worsening fatigue. In the ED evaluation revealed a urinary tract infection. She was hospitalized for further management.  Pt admitted with MRSA and myositis.   RD consulted for poor po intake. Per chart review, meal completion 25%.   Attempted to interview pt and family x 3, however, pt wither in with other providers or in sterile procedure at times of visits.   Wt hx reviewed. No current wt for this hospitalization. Last documented wt from 07/01/14. Unable to complete Nutrition-Focused physical exam at this time.   ID following; pt with staph aureus bactremia. Blood cultures have been negative. PICC being placed at time of attempted visit. Plan for 4 weeks of IV vancomycin.   RD will add supplement to optimize PO intake.   Labs reviewed: CBGS: 94-106.   Diet Order:  Diet Carb Modified Fluid consistency:: Thin; Room service appropriate?: Yes  Skin:  Reviewed, no issues  Last BM:  09/19/15  Height:   Ht Readings from Last 1 Encounters:  07/01/14 4\' 9"  (1.448 m)    Weight:   Wt Readings from Last 1 Encounters:  07/01/14 94  lb 12.8 oz (43.001 kg)    Ideal Body Weight:  43.2 kg  BMI:  Estimated body mass index is 20.51 kg/(m^2) as calculated from the following:   Height as of 07/01/14: 4\' 9"  (1.448 m).   Weight as of 07/01/14: 94 lb 12.8 oz (43.001 kg).  Estimated Nutritional Needs:   Kcal:  1200-1400  Protein:  50-60 grams  Fluid:  1.2-1.4 L  EDUCATION NEEDS:   Education needs addressed  Nesanel Aguila A. Jimmye Norman, RD, LDN, CDE Pager: [phone] After hours Pager: [phone]

## 2015-09-21 NOTE — Progress Notes (Signed)
[facility]  Patient Status: New pt for [facility] this admission  [facility] is providing the following services: HHRN and Home infusion services.  I met with [name] tonight with help from [place] from Nutrition Team as he was serving dinner tray to pt.  I was able to decipher from pt that her grandson, Jacqulyn Bath, who lives with her, will be the person who will to learn how to give the IV ABX at home.  I will contact Jose to see if he can come to the hospital for teaching. [facility] will follow pt until DC to support transition home when ordered.  If patient discharges after hours, please call [phone].   Larry Sierras 09/21/2015, 10:52 PM

## 2015-09-21 NOTE — Progress Notes (Signed)
Pharmacy Antibiotic Note  Emily Hoover is a 70 y.o. female admitted on 09/16/2015 with bacteremia.  Pharmacy has been consulted for Vancomycin dosing.  VT this AM is sub-therapeutic  Plan: -Increase vancomycin to 750 mg IV q12h -Re-check vancomycin trough at steady state  Temp (24hrs), Avg:98.4 F (36.9 C), Min:98.3 F (36.8 C), Max:98.6 F (37 C)   Recent Labs Lab 09/16/15 1626 09/16/15 1651 09/16/15 1913 09/17/15 0746 09/18/15 0631 09/19/15 0522 09/20/15 0635 09/21/15 0523  WBC 13.1*  --   --  10.2 10.6* 9.7 12.3*  --   CREATININE 0.57  --   --  0.42* 0.43* <0.30* 0.32*  --   LATICACIDVEN  --  1.80 1.28  --   --   --   --   --   VANCOTROUGH  --   --   --   --   --   --   --  5*    CrCl cannot be calculated (Unknown ideal weight.).    No Known Allergies  Microbiology results: BCx: MRSA  Emily Hoover 09/21/2015 7:03 AM

## 2015-09-21 NOTE — Evaluation (Signed)
Physical Therapy Evaluation Patient Details Name: Emily Hoover MRN: [id number] DOB: 03-27-1946 Today's Date: 09/21/2015   History of Present Illness  70 year old female of Hispanic origin who doesn't speak much English with a past medical history of hypertension and diabetes, presented with the complaints of left hip pain ongoing for the last 1 week. She was diagnosed with myositis based on MRI. She was given steroids. Her pain persisted. She presented because of progressively worsening fatigue. In the ED evaluation revealed a urinary tract infection. She was hospitalized for further management. She was subsequently noted to be bacteremic with MRSA.  Clinical Impression  Pt is demonstrating significant issues tolerating WBing on her LLE and is mod assist to walk with 2 due to pain.  Her inability to tolerate LLE wbing is going to make SNF important as she is alone and has stairs to enter her house.  Strength is 2- L hip and has less than 90 deg flexion.  Will anticipate the family helping but will not be staying with her at home.    Follow Up Recommendations SNF    Equipment Recommendations  Rolling walker with 5" wheels    Recommendations for Other Services Rehab consult     Precautions / Restrictions Precautions Precautions: Fall Restrictions Weight Bearing Restrictions: No      Mobility  Bed Mobility Overal bed mobility: Needs Assistance Bed Mobility: Supine to Sit     Supine to sit: Mod assist     General bed mobility comments: assisted to manage legs and support trunk to get to side of bed  Transfers Overall transfer level: Needs assistance Equipment used: Rolling walker (2 wheeled) Transfers: Sit to/from [employer] Transfers Sit to Stand: Max assist;+2 physical assistance;From elevated surface;+2 safety/equipment Stand pivot transfers: Max assist;+2 physical assistance;+2 safety/equipment;From elevated surface          Ambulation/Gait Ambulation/Gait  assistance: Mod assist;+2 physical assistance;+2 safety/equipment Ambulation Distance (Feet): 3 Feet Assistive device: Rolling walker (2 wheeled);2 person hand held assist Gait Pattern/deviations: Step-to pattern;Decreased stride length;Decreased stance time - right;Decreased step length - left;Wide base of support;Narrow base of support;Antalgic Gait velocity: very halting and slow Gait velocity interpretation: Below normal speed for age/gender    Stairs            Wheelchair Mobility    Modified Rankin (Stroke Patients Only)       Balance Overall balance assessment: Needs assistance Sitting-balance support: Feet supported Sitting balance-Leahy Scale: Poor     Standing balance support: No upper extremity supported;Bilateral upper extremity supported Standing balance-Leahy Scale: Poor                               Pertinent Vitals/Pain Pain Assessment: Faces Faces Pain Scale: Hurts worst Pain Location: L groin and abdomen Pain Intervention(s): Limited activity within patient's tolerance;Monitored during session;Repositioned;Premedicated before session;Patient requesting pain meds-RN notified    Home Living Family/patient expects to be discharged to:: Private residence Living Arrangements: Alone Available Help at Discharge: Family;Available PRN/intermittently Type of Home: House Home Access: Stairs to enter Entrance Stairs-Rails: Right;Left;Can reach both Entrance Stairs-Number of Steps: 4-5 Home Layout: One level Home Equipment: Bedside commode      Prior Function Level of Independence: Independent               Hand Dominance        Extremity/Trunk Assessment   Upper Extremity Assessment: Generalized weakness  Lower Extremity Assessment: Generalized weakness      Cervical / Trunk Assessment: Normal  Communication   Communication: Prefers language other than English  Cognition Arousal/Alertness: Awake/alert Behavior  During Therapy: WFL for tasks assessed/performed Overall Cognitive Status: Within Functional Limits for tasks assessed                      General Comments General comments (skin integrity, edema, etc.): Pt was with family in her room to support the effort and encourage her to work.      Exercises        Assessment/Plan    PT Assessment Patient needs continued PT services  PT Diagnosis Generalized weakness;Acute pain;Difficulty walking   PT Problem List Decreased strength;Decreased range of motion;Decreased activity tolerance;Decreased balance;Decreased mobility;Decreased coordination;Decreased knowledge of use of DME;Decreased safety awareness;Decreased skin integrity;Pain  PT Treatment Interventions DME instruction;Gait training;Stair training;Functional mobility training;Therapeutic activities;Therapeutic exercise;Balance training;Neuromuscular re-education;Patient/family education   PT Goals (Current goals can be found in the Care Plan section) Acute Rehab PT Goals Patient Stated Goal: none stated PT Goal Formulation: With patient/family Time For Goal Achievement: 10/05/15 Potential to Achieve Goals: Good    Frequency Min 3X/week   Barriers to discharge Inaccessible home environment;Decreased caregiver support home alone in staired environment    Co-evaluation PT/OT/SLP Co-Evaluation/Treatment: Yes Reason for Co-Treatment: Complexity of the patient's impairments (multi-system involvement);Necessary to address cognition/behavior during functional activity;For patient/therapist safety PT goals addressed during session: Mobility/safety with mobility;Balance;Proper use of DME         End of Session Equipment Utilized During Treatment: Gait belt Activity Tolerance: Patient limited by pain;Patient limited by fatigue Patient left: in chair;with call bell/phone within reach;with nursing/sitter in room;with family/visitor present Nurse Communication: Mobility status          Time: 1010-1034 PT Time Calculation (min) (ACUTE ONLY): 24 min   Charges:   PT Evaluation $PT Eval Moderate Complexity: 1 Procedure PT Treatments $Gait Training: 8-22 mins   PT G CodesRamond Dial 08-Oct-2015, 6:47 PM  Mee Hives, PT MS Acute Rehab Dept. Number: [facility] [id number] and [place] [phone]

## 2015-09-21 NOTE — Progress Notes (Signed)
[facility] PROGRESS NOTE  Emily Hoover BFX:[id number] DOB: October 28, 1945 DOA: 09/16/2015  PCP: No primary care provider on file.  Brief HPI: 69 year old female of Hispanic origin who doesn't speak much English with a past medical history of hypertension and diabetes, presented with the complaints of left hip pain ongoing for the last 1 week. She was diagnosed with myositis based on MRI. She was given steroids. Her pain persisted. She presented because of progressively worsening fatigue. In the ED evaluation revealed a urinary tract infection. She was hospitalized for further management. She was subsequently noted to be bacteremic with MRSA. ID was consulted.is here with left groin/hip pain and MRSA in her urine culture. She also has had 1 blood culture grow MRSA from 09/17/2015. 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 She currently has no evidence of endocarditis by transthoracic echo or stigmata of endocarditis on clinical exam. She has remained afebrile, her WBC is slightly higher today, and her left hip pain, while still present, has improved. Repeat blood cultures are processing  Past medical history:  Past Medical History  Diagnosis Date  . Diabetes mellitus   . Hypertension     Consultants: Orthopedics. Infectious disease  Procedures:  Echocardiogram Study Conclusions - Left ventricle: The cavity size was normal. There was moderate concentric hypertrophy. Systolic function was normal. The estimated ejection fraction was in the range of 60% to 65%. Wall motion was normal; there were no regional wall motion abnormalities. Doppler parameters are consistent with abnormal left ventricular relaxation (grade 1 diastolic dysfunction). - Aortic valve: Trileaflet; mildly thickened, mildly calcified leaflets. - Mitral valve: Moderately calcified annulus. - Tricuspid valve: There was mild regurgitation.  Antibiotics: Ceftriaxone  and vancomycin Ceftriaxone discontinued.  Subjective: [name] is feeling better today in terms of the pain in her left hip/groin area, but it is still present   Objective: Vital Signs  Filed Vitals:   09/20/15 0632 09/20/15 1310 09/20/15 2152 09/21/15 0523  BP: 160/74 146/71 153/73 174/91  Pulse: 86 86 94 99  Temp: 98.5 F (36.9 C) 98.6 F (37 C) 98.4 F (36.9 C) 98.3 F (36.8 C)  TempSrc: Oral Oral Oral Oral  Resp: 18 16 18 20   SpO2: 94% 94% 93% 94%    Intake/Output Summary (Last 24 hours) at 09/21/15 0818 Last data filed at 09/20/15 1859  Gross per 24 hour  Intake 719.17 ml  Output      0 ml  Net 719.17 ml   There were no vitals filed for this visit.  General appearance: alert, cooperative and no distress Resp: clear to auscultation bilaterally Cardio: S1, S2 is normal, regular. No S3, S4. Systolic murmur appreciated over the precordium. GI: soft, non-tender; bowel sounds normal; no masses,  no organomegaly Extremities: Restricted range of motion of the left hip. No obvious swelling is appreciated. Neurologic: Awake and alert. No focal neurological deficits.  Lab Results:  Basic Metabolic Panel:  Recent Labs Lab 09/16/15 1626 09/17/15 0746 09/18/15 0631 09/19/15 0522 09/20/15 0635  NA 130* 134* 132* 135 137  K 3.8 3.2* 4.0 3.8 3.3*  CL 95* 103 100* 100* 103  CO2 22 21* 24 25 27   GLUCOSE 301* 186* 239* 189* 119*  BUN 13 10 9 7  <5*  CREATININE 0.57 0.42* 0.43* <0.30* 0.32*  CALCIUM 8.9 7.9* 7.9* 8.0* 8.0*  MG  --  1.5* 1.8  --   --   PHOS  --  1.9*  --   --   --  Liver Function Tests:  Recent Labs Lab 09/16/15 1626 09/17/15 0746  AST 15 14*  ALT 17 14  ALKPHOS 113 97  BILITOT 1.0 0.9  PROT 6.5 5.6*  ALBUMIN 2.6* 2.1*    Recent Labs Lab 09/16/15 1626  LIPASE 15   CBC:  Recent Labs Lab 09/16/15 1626 09/17/15 0746 09/18/15 0631 09/19/15 0522 09/20/15 0635  WBC 13.1* 10.2 10.6* 9.7 12.3*  HGB 12.4 11.9* 10.9* 10.9* 11.0*  HCT  35.7* 33.8* 32.5* 32.6* 33.4*  MCV 84.2 84.7 85.3 86.9 86.3  PLT 294 291 321 373 428*   Cardiac Enzymes:  Recent Labs Lab 09/16/15 1907  CKTOTAL 30*   CBG:  Recent Labs Lab 09/20/15 0820 09/20/15 1243 09/20/15 1808 09/20/15 2150 09/21/15 0802  GLUCAP 118* 133* 91 106* 133*    Recent Results (from the past 240 hour(s))  Urine culture     Status: None   Collection Time: 09/13/15  2:58 PM  Result Value Ref Range Status   Specimen Description URINE, CLEAN CATCH  Final   Special Requests Normal  Final   Culture   Final    >=100,000 COLONIES/mL METHICILLIN RESISTANT STAPHYLOCOCCUS AUREUS   Report Status 09/16/2015 FINAL  Final   Organism ID, Bacteria METHICILLIN RESISTANT STAPHYLOCOCCUS AUREUS  Final      Susceptibility   Methicillin resistant staphylococcus aureus - MIC*    CIPROFLOXACIN <=0.5 SENSITIVE Sensitive     GENTAMICIN <=0.5 SENSITIVE Sensitive     NITROFURANTOIN <=16 SENSITIVE Sensitive     OXACILLIN >=4 RESISTANT Resistant     TETRACYCLINE <=1 SENSITIVE Sensitive     VANCOMYCIN <=0.5 SENSITIVE Sensitive     TRIMETH/SULFA <=10 SENSITIVE Sensitive     CLINDAMYCIN <=0.25 SENSITIVE Sensitive     RIFAMPIN <=0.5 SENSITIVE Sensitive     Inducible Clindamycin NEGATIVE Sensitive     * >=100,000 COLONIES/mL METHICILLIN RESISTANT STAPHYLOCOCCUS AUREUS  Urine culture     Status: None   Collection Time: 09/16/15  4:37 PM  Result Value Ref Range Status   Specimen Description URINE, RANDOM  Final   Special Requests NONE  Final   Culture MULTIPLE SPECIES PRESENT, SUGGEST RECOLLECTION  Final   Report Status 09/18/2015 FINAL  Final  Culture, blood (Routine X 2) w Reflex to ID Panel     Status: None   Collection Time: 09/17/15 12:00 PM  Result Value Ref Range Status   Specimen Description BLOOD LEFT ANTECUBITAL  Final   Special Requests BOTTLES DRAWN AEROBIC AND ANAEROBIC 10CC  Final   Culture  Setup Time   Final    GRAM POSITIVE COCCI IN CLUSTERS ANAEROBIC BOTTLE  ONLY CRITICAL RESULT CALLED TO, READ BACK BY AND VERIFIED WITH: Star Age [phone] [place]    Culture METHICILLIN RESISTANT STAPHYLOCOCCUS AUREUS  Final   Report Status 09/20/2015 FINAL  Final   Organism ID, Bacteria METHICILLIN RESISTANT STAPHYLOCOCCUS AUREUS  Final      Susceptibility   Methicillin resistant staphylococcus aureus - MIC*    CIPROFLOXACIN <=0.5 SENSITIVE Sensitive     ERYTHROMYCIN <=0.25 SENSITIVE Sensitive     GENTAMICIN <=0.5 SENSITIVE Sensitive     OXACILLIN >=4 RESISTANT Resistant     TETRACYCLINE <=1 SENSITIVE Sensitive     VANCOMYCIN <=0.5 SENSITIVE Sensitive     TRIMETH/SULFA <=10 SENSITIVE Sensitive     CLINDAMYCIN <=0.25 SENSITIVE Sensitive     RIFAMPIN <=0.5 SENSITIVE Sensitive     Inducible Clindamycin NEGATIVE Sensitive     * METHICILLIN RESISTANT STAPHYLOCOCCUS AUREUS  Culture, blood (  Routine X 2) w Reflex to ID Panel     Status: None (Preliminary result)   Collection Time: 09/17/15 12:15 PM  Result Value Ref Range Status   Specimen Description BLOOD BLOOD LEFT HAND  Final   Special Requests BOTTLES DRAWN AEROBIC AND ANAEROBIC 10CC  Final   Culture NO GROWTH 3 DAYS  Final   Report Status PENDING  Incomplete  MRSA PCR Screening     Status: Abnormal   Collection Time: 09/19/15  7:34 AM  Result Value Ref Range Status   MRSA by PCR POSITIVE (A) NEGATIVE Final    Comment:        The GeneXpert MRSA Assay (FDA approved for NASAL specimens only), is one component of a comprehensive MRSA colonization surveillance program. It is not intended to diagnose MRSA infection nor to guide or monitor treatment for MRSA infections. RESULT CALLED TO, READ BACK BY AND VERIFIED WITH: Jefferson Fuel RN 9:40 09/19/15 ([email or web address])   Culture, blood (routine x 2)     Status: None (Preliminary result)   Collection Time: 09/19/15 12:45 PM  Result Value Ref Range Status   Specimen Description BLOOD LEFT HAND  Final   Special Requests IN PEDIATRIC BOTTLE 2CC  Final    Culture NO GROWTH < 24 HOURS  Final   Report Status PENDING  Incomplete  Culture, blood (routine x 2)     Status: None (Preliminary result)   Collection Time: 09/19/15 12:55 PM  Result Value Ref Range Status   Specimen Description BLOOD LEFT HAND  Final   Special Requests IN PEDIATRIC BOTTLE 2.5CC  Final   Culture NO GROWTH < 24 HOURS  Final   Report Status PENDING  Incomplete      Studies/Results: No results found.  Medications:  Scheduled: . enoxaparin (LOVENOX) injection  20 mg Subcutaneous Q24H  . Influenza vac split quadrivalent PF  0.5 mL Intramuscular Tomorrow-1000  . insulin aspart  0-15 Units Subcutaneous TID WC  . insulin aspart  0-5 Units Subcutaneous QHS  . insulin glargine  10 Units Subcutaneous QHS  . potassium chloride  40 mEq Oral Once  . vancomycin  750 mg Intravenous Q12H   Continuous: . 0.9 % NaCl with KCl 40 mEq / L     LYY:[id number] **OR** acetaminophen, HYDROcodone-acetaminophen, morphine injection, ondansetron **OR** ondansetron (ZOFRAN) IV, polyethylene glycol, traMADol  Assessment/Plan:  Principal Problem:   Staphylococcus aureus bacteremia Active Problems:   Diabetes (HCC)   UTI (lower urinary tract infection)   Hip pain   Hypertension   Pain management    Left hip pain/infectious myositis Patient underwent MRI recently which showed abnormal appearance of the musculature including the iliopsoas and other muscle groups. CT concerning for myositis. No abscess was noted. In view of bacteremia and presence of MRSA in the urine, infectious myositis was diagnosed. CK level was normal. CRP is 27.1. ESR is 88. Patient was given a course of steroids with only minimal improvement. Seen by orthopedics this admission. No fractures identified on CT scan. PT and OT to mobilize and evaluate.  MRSA Bacteremia One out of 2 sets is noted to be growing MRSA. Continue vancomycin. Patient was noted to have MRSA on urine culture reports from March 7.  Echocardiogram report is as above. No concerning findings appreciated. ID is following. Infectious disease recommends minimum 3-4 weeks of treatment. TEE? PICC line to be placed when recommended by infectious disease, Likely 3/16  Urinary tract infection Urine culture from March 7 revealed MRSA. Patient was placed on  ceftriaxone. Urine cultures from this hospitalization showed multiple species. Ceftriaxone has been discontinued. CT scan of the abdomen and pelvis does not reveal any abscess.  History of essential hypertension Monitor blood pressures closely.   History of diabetes mellitus type 2, poorly controlled Continue SSI. Holding metformin and Amaryl. On Lantus during this hospitalization. HbA1c is pending.  Hypokalemia and hypomagnesemia Repleted.  Normocytic anemia Anemia panel reviewed. No clear evidence for iron deficiency.   DVT Prophylaxis: Lovenox    Code Status: Full code  Family Communication: Discussed with the patient's family  -2 daughters who are by the bedside. Disposition Plan: Pending infectious disease recommendations. May need SNF for 4 weeks if patient daughter unable to keep this patient for 4 weeks to receive IV antibiotics at home    LOS: 4 days   [facility] Pager [phone] 09/21/2015, 8:18 AM  If 7PM-7AM, please contact night-coverage at [email or web address], password [facility]

## 2015-09-21 NOTE — Evaluation (Signed)
Occupational Therapy Evaluation Patient Details Name: Emily Hoover Sessions MRN: [id number] DOB: 13-Oct-1945 Today's Date: 09/21/2015    History of Present Illness 70 year old female of Hispanic origin who doesn't speak much English with a past medical history of hypertension and diabetes, presented with the complaints of left hip pain ongoing for the last 1 week. She was diagnosed with myositis based on MRI. She was given steroids. Her pain persisted. She presented because of progressively worsening fatigue. In the ED evaluation revealed a urinary tract infection. She was hospitalized for further management. She was subsequently noted to be bacteremic with MRSA.   Clinical Impression   Patient presenting with decreased ADL and functional mobility independence secondary to above. Patient independent PTA. Patient currently functioning at an overall mod to max assist level, requiring +2 for OOB mobility. Patient will benefit from acute OT to increase overall independence in the areas of ADLs, functional mobility, and overall safety in order to safely discharge to venue listed below.     Follow Up Recommendations  SNF;Supervision/Assistance - 24 hour (vs CIR)    Equipment Recommendations  Other (comment) (TBD, suspect pt will need AE)    Recommendations for Other Services Rehab consult (Pt has CIR potential )   Precautions / Restrictions Precautions Precautions: Fall Restrictions Weight Bearing Restrictions: No    Mobility Bed Mobility Overal bed mobility: Needs Assistance Bed Mobility: Supine to Sit     Supine to sit: Mod assist     General bed mobility comments: Assistance for management of BLEs and trunk  Transfers Overall transfer level: Needs assistance Equipment used: Rolling walker (2 wheeled);None Transfers: Sit to/from [facility] Sit to Stand: Max assist;+2 safety/equipment;+2 physical assistance Stand pivot transfers: Max assist;+2 safety/equipment;+2 physical  assistance       General transfer comment: Pt limited by increased pain. Max assist and multimodal cueing for safety, sequencing, technique.      Balance Overall balance assessment: Needs assistance Sitting-balance support: Feet supported;Bilateral upper extremity supported Sitting balance-Leahy Scale: Poor   Postural control: Posterior lean Standing balance support: No upper extremity supported;During functional activity Standing balance-Leahy Scale: Poor    ADL Overall ADL's : Needs assistance/impaired Eating/Feeding: Set up;Sitting   Grooming: Set up;Sitting   Upper Body Bathing: Minimal assitance;Sitting   Lower Body Bathing: Moderate assistance;Sit to/from stand   Upper Body Dressing : Minimal assistance;Sitting   Lower Body Dressing: Maximal assistance;Sit to/from stand   Toilet Transfer: Maximal assistance;+2 for physical assistance;+2 for safety/equipment;BSC Toilet Transfer Details (indicate cue type and reason): simulated EOB to recliner Toileting- Clothing Manipulation and Hygiene: Maximal assistance;Sit to/from Nurse, children's Details (indicate cue type and reason): did not occur    General ADL Comments: Pt required increased assistance secondary to pain. Max assist for sit to/from stands (+2 helpful for safety and physical assist).     Vision Additional Comments: no change from baseline          Pertinent Vitals/Pain Pain Assessment: Faces Faces Pain Scale: Hurts worst Pain Location: left groin area Pain Descriptors / Indicators: Aching;Sore;Crying;Discomfort;Grimacing;Guarding Pain Intervention(s): Limited activity within patient's tolerance;Monitored during session;Repositioned;Patient requesting pain meds-RN notified   Extremity/Trunk Assessment Upper Extremity Assessment Upper Extremity Assessment: Generalized weakness   Lower Extremity Assessment Lower Extremity Assessment: Defer to PT evaluation   Cervical / Trunk  Assessment Cervical / Trunk Assessment: Normal   Communication Communication Communication: Prefers language other than English   Cognition Arousal/Alertness: Awake/alert Behavior During Therapy: WFL for tasks assessed/performed Overall Cognitive Status: Within  Functional Limits for tasks assessed             Home Living Family/patient expects to be discharged to:: Private residence Living Arrangements: Alone Available Help at Discharge: Family;Available PRN/intermittently Type of Home: House Home Access: Stairs to enter [employer] of Steps: 4-5 Entrance Stairs-Rails: Right;Left;Can reach both Home Layout: One level     Bathroom Shower/Tub: Walk-in Hydrologist: Standard     Home Equipment: Bedside commode   Prior Functioning/Environment Level of Independence: Independent     OT Diagnosis: Generalized weakness;Acute pain   OT Problem List: Decreased strength;Decreased range of motion;Decreased activity tolerance;Impaired balance (sitting and/or standing);Decreased safety awareness;Decreased knowledge of use of DME or AE;Decreased knowledge of precautions;Pain   OT Treatment/Interventions: Self-care/ADL training;Therapeutic exercise;Energy conservation;DME and/or AE instruction;Therapeutic activities;Patient/family education;Balance training    OT Goals(Current goals can be found in the care plan section) Acute Rehab OT Goals Patient Stated Goal: none stated OT Goal Formulation: With patient/family Time For Goal Achievement: 10/05/15 Potential to Achieve Goals: Good ADL Goals Pt Will Perform Grooming: with set-up;sitting Pt Will Perform Lower Body Bathing: with supervision;sit to/from stand;with adaptive equipment Pt Will Perform Lower Body Dressing: with supervision;sit to/from stand;with adaptive equipment Pt Will Transfer to Toilet: with min assist;ambulating;bedside commode Additional ADL Goal #1: Pt will perform sit to/from stands  with min assist to decrease burden of care  OT Frequency: Min 2X/week   Barriers to D/C: Decreased caregiver support       Co-evaluation PT/OT/SLP Co-Evaluation/Treatment: Yes Reason for Co-Treatment: Complexity of the patient's impairments (multi-system involvement);For patient/therapist safety   OT goals addressed during session: ADL's and self-care;Strengthening/ROM      End of Session Equipment Utilized During Treatment: Gait belt;Rolling walker Nurse Communication: Mobility status;Other (comment) (need to check brief due to incontienence )  Activity Tolerance: Patient limited by pain Patient left: in chair;with call bell/phone within reach;with chair alarm set;with family/visitor present;with nursing/sitter in room   Time: [id number] OT Time Calculation (min): 21 min Charges:  OT General Charges $OT Visit: 1 Procedure OT Evaluation $OT Eval Moderate Complexity: 1 Procedure  Chrys Racer , MS, OTR/L, CLT Pager: [phone]  09/21/2015, 2:15 PM

## 2015-09-21 NOTE — Hospital Discharge Follow-Up (Addendum)
This Case Manager was asked by Whitman Hero, RN CM to discuss resources available at [facility] and [facility].   Met with patient at bedside. Geophysical data processor used for Spanish language translation. Patient confirmed she does not have a PCP and is uninsured. She indicated she lives with her son in a private residence. Provided information about the [place] and informed patient of clinic's onsite resources. Patient verbalized understanding. Patient agreeable to hospital follow-up appointment, and appointment scheduled for 09/27/15 at 1400 with Dr. Janne Napoleon. AVS updated. Inquired if patient would have transportation to her appointment. Patient uncertain if she will have transportation because her son works and indicated she would ask other family members for a ride. Patient does indicate she is having significant difficulty ambulating with her walker. Discussed with Whitman Hero who indicated current plan is for SNF vs Home with IV antibiotics. [facility] [place] Case Manager will need to follow-up with patient if returning home to determine if patient has transportation to upcoming appointment (patient's phone # [phone]). Will continue to follow patient's clinical progress closely.

## 2015-09-21 NOTE — NC FL2 (Signed)
[place] LEVEL OF CARE SCREENING TOOL     IDENTIFICATION  Patient Name: Emily Hoover Birthdate: 07/22/1945 Sex: female Admission Date (Current Location): 09/16/2015  [facility] and [place] Number:  Herbalist and Address:  [facility] [place] [place], [place] [address] [place], [place] [address]      Provider Number: [id number]  Attending Physician Name and Address:  Reyne Dumas, MD  Relative Name and Phone Number:  Alyson Locket, daughter, [phone]    Current Level of Care: Hospital Recommended Level of Care: [place] Prior Approval Number:    Date Approved/Denied:   PASRR Number: [id number]  Discharge Plan: SNF    Current Diagnoses: Patient Active Problem List   Diagnosis Date Noted  . Staphylococcus aureus bacteremia 09/19/2015  . UTI (lower urinary tract infection) 09/16/2015  . Hip pain 09/16/2015  . Hypertension 09/16/2015  . Pain management 09/16/2015  . Diabetes ([place]) 06/30/2013    Orientation RESPIRATION BLADDER Height & Weight     Self, Time, Situation, Place  Normal Incontinent Weight:   Height:     BEHAVIORAL SYMPTOMS/MOOD NEUROLOGICAL BOWEL NUTRITION STATUS      Continent  (Please see DC summary)  AMBULATORY STATUS COMMUNICATION OF NEEDS Skin   Extensive Assist Verbally Normal                       Personal Care Assistance Level of Assistance  Bathing, Feeding, Dressing Bathing Assistance: Maximum assistance Feeding assistance: Independent Dressing Assistance: Limited assistance     Functional Limitations Info             SPECIAL CARE FACTORS FREQUENCY  PT (By licensed PT), OT (By licensed OT)     PT Frequency: Not yet assessed OT Frequency: min 2x/week            Contractures      Additional Factors Info  Code Status, Allergies, Isolation Precautions, Insulin Sliding Scale Code Status Info: Full Allergies Info: NKA   Insulin Sliding Scale Info: insulin aspart (novoLOG)  injection 0-15 Units; insulin aspart (novoLOG) injection 0-5 Units; insulin glargine (LANTUS) injection 10 Units Isolation Precautions Info: MRSA; contact precautions     Current Medications (09/21/2015):  This is the current hospital active medication list Current Facility-Administered Medications  Medication Dose Route Frequency Provider Last Rate Last Dose  . 0.9 % NaCl with KCl 40 mEq / L  infusion   Intravenous Continuous Reyne Dumas, MD 75 mL/hr at 09/21/15 1002 75 mL/hr at 09/21/15 1002  . acetaminophen (TYLENOL) tablet 650 mg  650 mg Oral Q6H PRN Toy Baker, MD       Or  . acetaminophen (TYLENOL) suppository 650 mg  650 mg Rectal Q6H PRN Toy Baker, MD      . enoxaparin (LOVENOX) injection 20 mg  20 mg Subcutaneous Q24H Toy Baker, MD   20 mg at 09/21/15 1230  . feeding supplement (GLUCERNA SHAKE) (GLUCERNA SHAKE) liquid 237 mL  237 mL Oral TID BM Reyne Dumas, MD      . HYDROcodone-acetaminophen (NORCO/VICODIN) 5-325 MG per tablet 1-2 tablet  1-2 tablet Oral Q4H PRN Toy Baker, MD   2 tablet at 09/21/15 1025  . Influenza vac split quadrivalent PF (FLUARIX) injection 0.5 mL  0.5 mL Intramuscular Tomorrow-1000 Bonnielee Haff, MD      . insulin aspart (novoLOG) injection 0-15 Units  0-15 Units Subcutaneous TID WC Toy Baker, MD   2 Units at 09/21/15 1007  . insulin aspart (novoLOG)  injection 0-5 Units  0-5 Units Subcutaneous QHS Toy Baker, MD   3 Units at 09/17/15 0159  . insulin glargine (LANTUS) injection 10 Units  10 Units Subcutaneous QHS Toy Baker, MD   10 Units at 09/20/15 2229  . morphine 2 MG/ML injection 2 mg  2 mg Intravenous Q3H PRN Toy Baker, MD   2 mg at 09/21/15 0255  . ondansetron (ZOFRAN) tablet 4 mg  4 mg Oral Q6H PRN Toy Baker, MD       Or  . ondansetron (ZOFRAN) injection 4 mg  4 mg Intravenous Q6H PRN Toy Baker, MD   4 mg at 09/18/15 2014  . polyethylene glycol (MIRALAX / GLYCOLAX)  packet 17 g  17 g Oral Daily PRN Toy Baker, MD      . sodium chloride flush (NS) 0.9 % injection 10-40 mL  10-40 mL Intracatheter PRN Reyne Dumas, MD      . traMADol (ULTRAM) tablet 50 mg  50 mg Oral Q6H PRN Toy Baker, MD   50 mg at 09/18/15 0837  . vancomycin (VANCOCIN) IVPB 750 mg/150 ml premix  750 mg Intravenous Q12H Erenest Blank, [facility]         Discharge Medications: Please see discharge summary for a list of discharge medications.  Relevant Imaging Results:  Relevant Lab Results:   Additional Information Will need IV Vanc through PICC line. SSN: [id number]. Speaks Spanish.  Benard Halsted, LCSWA

## 2015-09-21 NOTE — Progress Notes (Signed)
Peripherally Inserted Central Catheter/Midline Placement  The IV Nurse has discussed with the patient and/or persons authorized to consent for the patient, the purpose of this procedure and the potential benefits and risks involved with this procedure.  The benefits include less needle sticks, lab draws from the catheter and patient may be discharged home with the catheter.  Risks include, but not limited to, infection, bleeding, blood clot (thrombus formation), and puncture of an artery; nerve damage and irregular heat beat.  Alternatives to this procedure were also discussed.  PICC/Midline Placement Documentation        Emily Hoover 09/21/2015, 3:02 PM Consent obtained by Claretha Cooper, RN using grandson as Spanish interpreter

## 2015-09-21 NOTE — Progress Notes (Signed)
UR COMPLETED  

## 2015-09-21 NOTE — Progress Notes (Signed)
[facility] for Infectious [facility]    Date of Admission:  09/16/2015   Ceftriaxone 3/10>>3/12 Doxycycline 3/11>>3/12 Vancomycin 3/12 >>  Principal Problem:   Staphylococcus aureus bacteremia Active Problems:   Diabetes ([place])   UTI (lower urinary tract infection)   Hip pain   Hypertension   Pain management   . enoxaparin (LOVENOX) injection  20 mg Subcutaneous Q24H  . Influenza vac split quadrivalent PF  0.5 mL Intramuscular Tomorrow-1000  . insulin aspart  0-15 Units Subcutaneous TID WC  . insulin aspart  0-5 Units Subcutaneous QHS  . insulin glargine  10 Units Subcutaneous QHS  . magnesium sulfate 1 - 4 g bolus IVPB  1 g Intravenous Once  . vancomycin  750 mg Intravenous Q12H    SUBJECTIVE: [name] is in the room with her 2 daughter's.  They are here from [facility].  [name] states that her dysuria has improved.  She is not having abdominal pain, fever or chills, or back pain.  Her left groin pain is worse this morning.  Her daughter's are concerned because they did not realize she had an infection in her muscles.  They are also concerned because [name] is unable to ambulate due to pain and she is only wearing diapers that are frequently soiled.  Review of Systems: Review of Systems  Constitutional: Negative for fever and chills.  Respiratory: Negative for cough and shortness of breath.   Cardiovascular: Negative for chest pain.  Gastrointestinal: Negative for nausea, vomiting, abdominal pain and diarrhea.  Genitourinary: Negative for flank pain.       She is experiencing incontinence due to not being able to ambulate to bathroom.  Musculoskeletal: Positive for joint pain. Negative for back pain.  Neurological: Positive for weakness. Negative for headaches.    Past Medical History    Diagnosis Date  . Diabetes mellitus   . Hypertension     Social History  Substance Use Topics  . Smoking status: Never Smoker   . Smokeless tobacco: None  . Alcohol Use: No    Family History  Problem Relation Age of Onset  . CAD Mother   . CAD Father   . Hypertension Father   . Diabetes Neg Hx   . Cancer Neg Hx    No Known Allergies  OBJECTIVE: Filed Vitals:   09/20/15 0632 09/20/15 1310 09/20/15 2152 09/21/15 0523  BP: 160/74 146/71 153/73 174/91  Pulse: 86 86 94 99  Temp: 98.5 F (36.9 C) 98.6 F (37 C) 98.4 F (36.9 C) 98.3 F (36.8 C)  TempSrc: Oral Oral Oral Oral  Resp: 18 16 18 20   SpO2: 94% 94% 93% 94%   There is no weight on file to calculate BMI.  Physical Exam  Constitutional:  Frail appearing, Spanish speaking woman, who appears to be in a lot of discomfort.  HENT:  Head: Normocephalic and atraumatic.  Eyes: Conjunctivae and EOM are normal.  Neck: Normal range of motion.  Cardiovascular: Normal rate, regular rhythm and normal heart sounds.   No murmur heard. Pulmonary/Chest: Effort normal and breath sounds normal.  Abdominal: Soft. She exhibits no distension. There is no tenderness. There is no rebound and no guarding.  Musculoskeletal:  Normal range of motion with right hip, normal strength right leg. Range of motion decreased due to pain of left leg, normal strength with plantarflexion, decreased strength with dorsiflexion due to pain.  Unable to lift leg off bed due to pain. Tender  to palpation of musculature in left hip.    Neurological: She is alert.  Skin: Skin is warm.  Some redness developing in right groin with no clear skin breakdown.    Lab Results Lab Results  Component Value Date   WBC 12.3* 09/20/2015   HGB 11.0* 09/20/2015   HCT 33.4* 09/20/2015   MCV 86.3 09/20/2015   PLT 428* 09/20/2015    Lab Results  Component Value Date   CREATININE 0.44 09/21/2015   BUN <5* 09/21/2015   NA 140 09/21/2015   K 3.6 09/21/2015   CL  102 09/21/2015   CO2 26 09/21/2015    Lab Results  Component Value Date   ALT 20 09/21/2015   AST 31 09/21/2015   ALKPHOS 124 09/21/2015   BILITOT 0.7 09/21/2015     Microbiology: Recent Results (from the past 240 hour(s))  Urine culture     Status: None   Collection Time: 09/13/15  2:58 PM  Result Value Ref Range Status   Specimen Description URINE, CLEAN CATCH  Final   Special Requests Normal  Final   Culture   Final    >=100,000 COLONIES/mL METHICILLIN RESISTANT STAPHYLOCOCCUS AUREUS   Report Status 09/16/2015 FINAL  Final   Organism ID, Bacteria METHICILLIN RESISTANT STAPHYLOCOCCUS AUREUS  Final      Susceptibility   Methicillin resistant staphylococcus aureus - MIC*    CIPROFLOXACIN <=0.5 SENSITIVE Sensitive     GENTAMICIN <=0.5 SENSITIVE Sensitive     NITROFURANTOIN <=16 SENSITIVE Sensitive     OXACILLIN >=4 RESISTANT Resistant     TETRACYCLINE <=1 SENSITIVE Sensitive     VANCOMYCIN <=0.5 SENSITIVE Sensitive     TRIMETH/SULFA <=10 SENSITIVE Sensitive     CLINDAMYCIN <=0.25 SENSITIVE Sensitive     RIFAMPIN <=0.5 SENSITIVE Sensitive     Inducible Clindamycin NEGATIVE Sensitive     * >=100,000 COLONIES/mL METHICILLIN RESISTANT STAPHYLOCOCCUS AUREUS  Urine culture     Status: None   Collection Time: 09/16/15  4:37 PM  Result Value Ref Range Status   Specimen Description URINE, RANDOM  Final   Special Requests NONE  Final   Culture MULTIPLE SPECIES PRESENT, SUGGEST RECOLLECTION  Final   Report Status 09/18/2015 FINAL  Final  Culture, blood (Routine X 2) w Reflex to ID Panel     Status: None   Collection Time: 09/17/15 12:00 PM  Result Value Ref Range Status   Specimen Description BLOOD LEFT ANTECUBITAL  Final   Special Requests BOTTLES DRAWN AEROBIC AND ANAEROBIC 10CC  Final   Culture  Setup Time   Final    GRAM POSITIVE COCCI IN CLUSTERS ANAEROBIC BOTTLE ONLY CRITICAL RESULT CALLED TO, READ BACK BY AND VERIFIED WITH: Star Age [id number] [place]    Culture  METHICILLIN RESISTANT STAPHYLOCOCCUS AUREUS  Final   Report Status 09/20/2015 FINAL  Final   Organism ID, Bacteria METHICILLIN RESISTANT STAPHYLOCOCCUS AUREUS  Final      Susceptibility   Methicillin resistant staphylococcus aureus - MIC*    CIPROFLOXACIN <=0.5 SENSITIVE Sensitive     ERYTHROMYCIN <=0.25 SENSITIVE Sensitive     GENTAMICIN <=0.5 SENSITIVE Sensitive     OXACILLIN >=4 RESISTANT Resistant     TETRACYCLINE <=1 SENSITIVE Sensitive     VANCOMYCIN <=0.5 SENSITIVE Sensitive     TRIMETH/SULFA <=10 SENSITIVE Sensitive     CLINDAMYCIN <=0.25 SENSITIVE Sensitive     RIFAMPIN <=0.5 SENSITIVE Sensitive     Inducible Clindamycin NEGATIVE Sensitive     * METHICILLIN RESISTANT STAPHYLOCOCCUS AUREUS  Culture, blood (Routine X 2) w Reflex to ID Panel     Status: None (Preliminary result)   Collection Time: 09/17/15 12:15 PM  Result Value Ref Range Status   Specimen Description BLOOD BLOOD LEFT HAND  Final   Special Requests BOTTLES DRAWN AEROBIC AND ANAEROBIC 10CC  Final   Culture NO GROWTH 3 DAYS  Final   Report Status PENDING  Incomplete  MRSA PCR Screening     Status: Abnormal   Collection Time: 09/19/15  7:34 AM  Result Value Ref Range Status   MRSA by PCR POSITIVE (A) NEGATIVE Final    Comment:        The GeneXpert MRSA Assay (FDA approved for NASAL specimens only), is one component of a comprehensive MRSA colonization surveillance program. It is not intended to diagnose MRSA infection nor to guide or monitor treatment for MRSA infections. RESULT CALLED TO, READ BACK BY AND VERIFIED WITH: Jefferson Fuel RN 9:40 09/19/15 ([email or web address])   Culture, blood (routine x 2)     Status: None (Preliminary result)   Collection Time: 09/19/15 12:45 PM  Result Value Ref Range Status   Specimen Description BLOOD LEFT HAND  Final   Special Requests IN PEDIATRIC BOTTLE 2CC  Final   Culture NO GROWTH < 24 HOURS  Final   Report Status PENDING  Incomplete  Culture, blood (routine x 2)     Status:  None (Preliminary result)   Collection Time: 09/19/15 12:55 PM  Result Value Ref Range Status   Specimen Description BLOOD LEFT HAND  Final   Special Requests IN PEDIATRIC BOTTLE 2.5CC  Final   Culture NO GROWTH < 24 HOURS  Final   Report Status PENDING  Incomplete     ASSESSMENT: [name] is a 70 year old Spanish speaking woman who is here with left groin/hip pain and MRSA in her urine culture.  She also has had 1 blood culture grow MRSA from 09/17/2015.  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  She currently has no evidence of endocarditis by transthoracic echo or stigmata of endocarditis on clinical exam.  She is having continued left groin/hip pain that is worse today.  She remains afebrile, blood cultures cultures from 09/17/15 still with only 1 positive.  Repeat blood cultures from 09/19/15 are still remaining no growth.   PLAN: 1. Continue vancomycin per pharmacy 2. 2nd set of blood cultures from 09/17/15 is still negative so can probably hold off doing TEE 3. Follow up repeat blood cultures drawn 09/19/15 4. Consider repeat imaging if pain continues to worsen.   5. Anticipate at least 4 weeks of parenteral antibiotics with clinic follow up before deciding final duration of treatment.  Consider repeating inflammatory markers to assess response to treatment 6. Will need a PICC line  Jule Ser, DO   09/21/2015, 10:45 AM   Addendum: 1 initial blood culture grew MRSA but the 3 more recent blood cultures remain negative. I feel comfortable having a PICC placed now. I'll plan on at least 4 weeks of IV vancomycin through 10/15/2015. I will arrange follow-up in my clinic before that date. I will sign off now.  Michel Bickers, MD [facility] [phone] pager   [phone] cell 09/21/2015, 1:37 PM

## 2015-09-22 ENCOUNTER — Encounter (HOSPITAL_COMMUNITY): Payer: Self-pay

## 2015-09-22 LAB — CULTURE, BLOOD (ROUTINE X 2): CULTURE: NO GROWTH

## 2015-09-22 LAB — COMPREHENSIVE METABOLIC PANEL
ALBUMIN: 1.7 g/dL — AB (ref 3.5–5.0)
ALK PHOS: 122 U/L (ref 38–126)
ALT: 21 U/L (ref 14–54)
AST: 25 U/L (ref 15–41)
Anion gap: 8 (ref 5–15)
BILIRUBIN TOTAL: 0.5 mg/dL (ref 0.3–1.2)
CO2: 28 mmol/L (ref 22–32)
CREATININE: 0.43 mg/dL — AB (ref 0.44–1.00)
Calcium: 8.6 mg/dL — ABNORMAL LOW (ref 8.9–10.3)
Chloride: 101 mmol/L (ref 101–111)
GFR calc Af Amer: >60 mL/min (ref >60)
GLUCOSE: 140 mg/dL — AB (ref 65–99)
Potassium: 4.4 mmol/L (ref 3.5–5.1)
Sodium: 137 mmol/L (ref 135–145)
TOTAL PROTEIN: 5.7 g/dL — AB (ref 6.5–8.1)

## 2015-09-22 LAB — GLUCOSE, CAPILLARY
GLUCOSE-CAPILLARY: 129 mg/dL — AB (ref 65–99)
GLUCOSE-CAPILLARY: 99 mg/dL (ref 65–99)
Glucose-Capillary: 213 mg/dL — ABNORMAL HIGH (ref 65–99)

## 2015-09-22 LAB — CBC
HEMATOCRIT: 36.2 % (ref 36.0–46.0)
HEMOGLOBIN: 12.2 g/dL (ref 12.0–15.0)
MCH: 29.5 pg (ref 26.0–34.0)
MCHC: 33.7 g/dL (ref 30.0–36.0)
MCV: 87.7 fL (ref 78.0–100.0)
Platelets: 563 10*3/uL — ABNORMAL HIGH (ref 150–400)
RBC: 4.13 MIL/uL (ref 3.87–5.11)
RDW: 13.8 % (ref 11.5–15.5)
WBC: 11.9 10*3/uL — AB (ref 4.0–10.5)

## 2015-09-22 MED ORDER — INSULIN GLARGINE 100 UNIT/ML ~~LOC~~ SOLN: 10.0000 [IU] | Freq: Every day | SUBCUTANEOUS | Status: DC

## 2015-09-22 MED ORDER — HEPARIN SOD (PORK) LOCK FLUSH 100 UNIT/ML IV SOLN
250.0000 [IU] | INTRAVENOUS | Status: AC | PRN
  Administered 2015-09-22: 250 [IU]

## 2015-09-22 MED ORDER — VANCOMYCIN HCL IN DEXTROSE 750-5 MG/150ML-% IV SOLN: 750.0000 mg | Freq: Two times a day (BID) | INTRAVENOUS | Status: DC

## 2015-09-22 MED ORDER — AMLODIPINE BESYLATE 10 MG PO TABS: 10.0000 mg | ORAL_TABLET | Freq: Every day | ORAL | Status: DC

## 2015-09-22 MED ORDER — HYDROCODONE-ACETAMINOPHEN 5-325 MG PO TABS: 1.0000 | ORAL_TABLET | ORAL | Status: DC | PRN

## 2015-09-22 NOTE — Progress Notes (Signed)
Pt d/c with family via wheelchair. Dayshift RN gave prescriptions and d/c teaching. Family got rolling walker in room for home. Will continue to monitor pt. Ranelle Oyster, RN

## 2015-09-22 NOTE — Progress Notes (Signed)
Nsg Discharge Note  Admit Date:  09/16/2015 Discharge date: 09/22/2015   Emily Hoover to be D/C'd Home with home health per MD order.  AVS completed.  Copy for chart, and copy for patient signed, and dated. Patient/caregiver able to verbalize understanding.  Discharge Medication:   Medication List    STOP taking these medications        predniSONE 10 MG tablet  Commonly known as:  DELTASONE     traMADol 50 MG tablet  Commonly known as:  ULTRAM      TAKE these medications        amLODipine 10 MG tablet  Commonly known as:  NORVASC  Take 1 tablet (10 mg total) by mouth daily.     glimepiride 2 MG tablet  Commonly known as:  AMARYL  Take 1 tablet (2 mg total) by mouth daily. NO MORE REFILLS WITHOUT OFFICE VISIT - 2ND NOTICE     HYDROcodone-acetaminophen 5-325 MG tablet  Commonly known as:  NORCO/VICODIN  Take 1-2 tablets by mouth every 4 (four) hours as needed for moderate pain.     insulin glargine 100 UNIT/ML injection  Commonly known as:  LANTUS  Inject 0.1 mLs (10 Units total) into the skin at bedtime.     metFORMIN 500 MG tablet  Commonly known as:  GLUCOPHAGE  TAKE ONE TABLET BY MOUTH TWICE DAILY WITH FOOD (NEEDS OFFICE VISIT FOR REFILLS"     Vancomycin 750 MG/150ML Soln  Commonly known as:  VANCOCIN  Inject 150 mLs (750 mg total) into the vein every 12 (twelve) hours.        Discharge Assessment: Filed Vitals:   09/22/15 0512 09/22/15 1303  BP: 170/83 142/73  Pulse: 89 95  Temp: 98.5 F (36.9 C) 97.9 F (36.6 C)  Resp: 18 16   Skin clean, MASD noted to buttocks and groin area. Barrier cream applied and floor stock powder applied. Family educated about briefs and skin damage.  IV catheter discontinued intact. Site without signs and symptoms of complications - no redness or edema noted at insertion site, patient denies c/o pain - only slight tenderness at site.  Dressing with slight pressure applied.  D/c Instructions-Education: Discharge instructions  given to patient/family with verbalized understanding. D/c education completed with patient/family including follow up instructions, medication list, d/c activities limitations if indicated, with other d/c instructions as indicated by MD - patient able to verbalize understanding, all questions fully answered. Patient instructed to return to ED, call 911, or call MD for any changes in condition.  Patient to be escorted via [place], and D/C home via private auto.  Dayle Points, RN 09/22/2015 7:32 PM

## 2015-09-22 NOTE — Progress Notes (Signed)
Nutrition Follow-up  DOCUMENTATION CODES:   Non-severe (moderate) malnutrition in context of chronic illness  INTERVENTION:   -Continue Glucerna Shake po TID, each supplement provides 220 kcal and 10 grams of protein -Continue MVIndaily  NUTRITION DIAGNOSIS:   Inadequate oral intake related to poor appetite as evidenced by meal completion < 25%.  Ongoing  GOAL:   Patient will meet greater than or equal to 90% of their needs  Progressing  MONITOR:   PO intake, Supplement acceptance, I & O's, Labs, Weight trends, Skin  REASON FOR ASSESSMENT:   Consult Assessment of nutrition requirement/status  ASSESSMENT:   70 year old female of Hispanic origin who doesn't speak much English with a past medical history of hypertension and diabetes, presented with the complaints of left hip pain ongoing for the last 1 week. She was diagnosed with myositis based on MRI. She was given steroids. Her pain persisted. She presented because of progressively worsening fatigue. In the ED evaluation revealed a urinary tract infection. She was hospitalized for further management.  Pt drowsy at time of visit and no family members present to obtain further hx. Pt able to answer simple questions.   Breakfast tray at bedside revealed that pt consumed about 50% of her oatmeal for breakfast. Pt was also sipping on an Enterex Diabetes supplement, which contains 240 kcals and 12 grams of protein (pt has consumed about 75% of supplement at time of visit). Pt reports that she likes these supplements. RD ordered Glucerna Shakes yesterday.   Wt obtained from bed scale (106.4#). Wt hx reveals UBW is around 94#.   Nutrition-Focused physical exam completed. Findings are mild to moderate fat depletion, mild to moderate muscle depletion, and no edema.   Suspect poor po intake PTA.   Labs reviewed: CBGS: [id number].    Diet Order:  Diet Carb Modified Fluid consistency:: Thin; Room service appropriate?: Yes Diet - low  sodium heart healthy  Skin:  Reviewed, no issues  Last BM:  09/19/15  Height:   Ht Readings from Last 1 Encounters:  07/01/14 4\' 9"  (1.448 m)    Weight:   Wt Readings from Last 1 Encounters:  09/22/15 106 lb 6.4 oz (48.263 kg)    Ideal Body Weight:  43.2 kg  BMI:  Body mass index is 23.02 kg/(m^2).  Estimated Nutritional Needs:   Kcal:  1200-1400  Protein:  50-60 grams  Fluid:  1.2-1.4 L  EDUCATION NEEDS:   Education needs addressed  Emily Hoover A. Jimmye Norman, RD, LDN, CDE Pager: [phone] After hours Pager: [phone]

## 2015-09-22 NOTE — Progress Notes (Signed)
Medication Samples have been provided to the patient.   Drug name: Lantus Solostar        Strength: 100 units/mL       Qty: 1 pen (3 mL) LOT: [id number] Exp.Date: 11/2017  The patient has been instructed regarding the correct time, dose, and frequency of taking this medication, including desired effects and most common side effects.   Dimitri Ped, PharmD. PGY-1 Pharmacy Resident Pager: [phone] 09/22/2015, 5:15 PM

## 2015-09-22 NOTE — Discharge Summary (Signed)
Physician Discharge Summary  Shatika Grinnell MRN: [id number] DOB/AGE: 03/03/1946 70 y.o.  PCP: No primary care provider on file.   Admit date: 09/16/2015 Discharge date: 09/22/2015  Discharge Diagnoses:     Principal Problem:   Staphylococcus aureus bacteremia Active Problems:   Diabetes ([place])   UTI (lower urinary tract infection)   Hip pain   Hypertension   Pain management    Follow-up recommendations Follow-up with PCP in 3-5 days , including all  additional recommended appointments as below Follow-up CBC, CMP in 3-5 days Patient is being discharged home with home health for continuation of IV vancomycin until 10/15/15     Medication List    STOP taking these medications        predniSONE 10 MG tablet  Commonly known as:  DELTASONE     traMADol 50 MG tablet  Commonly known as:  ULTRAM      TAKE these medications        glimepiride 2 MG tablet  Commonly known as:  AMARYL  Take 1 tablet (2 mg total) by mouth daily. NO MORE REFILLS WITHOUT OFFICE VISIT - 2ND NOTICE     HYDROcodone-acetaminophen 5-325 MG tablet  Commonly known as:  NORCO/VICODIN  Take 1-2 tablets by mouth every 4 (four) hours as needed for moderate pain.     insulin glargine 100 UNIT/ML injection  Commonly known as:  LANTUS  Inject 0.1 mLs (10 Units total) into the skin at bedtime.     metFORMIN 500 MG tablet  Commonly known as:  GLUCOPHAGE  TAKE ONE TABLET BY MOUTH TWICE DAILY WITH FOOD (NEEDS OFFICE VISIT FOR REFILLS"     Vancomycin 750 MG/150ML Soln  Commonly known as:  VANCOCIN  Inject 150 mLs (750 mg total) into the vein every 12 (twelve) hours.         Discharge Condition: Stable Discharge Instructions       Discharge Instructions    Diet - low sodium heart healthy    Complete by:  As directed      Increase activity slowly    Complete by:  As directed            No Known Allergies    Disposition: 01-Home or Self Care   Consults:  Infectious  disease     Significant Diagnostic Studies:  Mr Pelvis Wo Contrast  10/11/15  CLINICAL DATA:  Left hip pain for 3 days with no recent injury. EXAM: MRI PELVIS WITHOUT CONTRAST TECHNIQUE: Multiplanar multisequence MR imaging of the pelvis was performed. No intravenous contrast was administered. COMPARISON:  2015-10-11 radiograph FINDINGS: The the there is abnormal edema in the left iliopsoas muscle and also in the left hip adductor musculature diffusely. No discrete operator or sciatic notch impingement. There is also some low-level edema in the anterior vastus musculature proximally on the left. Lower lumbar spondylosis and degenerative disc disease. Sacroiliac joints unremarkable. No significant abnormal marrow edema in the bony pelvis is observed to suggest a fracture or stress fracture. Trace left trochanteric bursitis. Borderline appearance for left hip joint effusion. Proximal hamstring tendons unremarkable. Anterolisthesis at L5-S1 due to pars defects. These were present in 2011 and hence considered chronic. IMPRESSION: 1. Unusual appearance of abnormal muscular edema involving the iliopsoas, left hip adductor musculature, and proximal left vastus musculature, but without underlying bony abnormality identified. No definite obturator impingement. Correlate with any fever/leukocytosis in assessing for an infectious myositis or fasciitis. There is no gas or abscess, at and only a borderline left hip  joint effusion. A neurologic or vascular cause seems less likely giving the differing nerve and arterial supply is to the iliacus and hip adductor musculature, but given the patient's history of diabetes, careful management is suggested. 2. 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 Electronically Signed   By: Van Clines M.D.   On: 09/13/2015 14:14   Ct Renal Stone Study  09/16/2015  CLINICAL DATA:  Hematuria and groin pain. Difficulty with ambulation. EXAM: CT ABDOMEN AND  PELVIS WITHOUT CONTRAST TECHNIQUE: Multidetector CT imaging of the abdomen and pelvis was performed following the standard protocol without IV contrast. COMPARISON:  Pelvic MRI 09/13/2015 FINDINGS: Lower chest: Mild motion artifact. Dependent atelectasis with trace pleural effusions. There are coronary artery calcifications. Heart is normal in size. Liver: Motion artifact inferiorly. Allowing for this and lack of intravenous contrast, no focal lesion is seen. Hepatobiliary: Gallbladder physiologically distended, no calcified stone. No biliary dilatation. Pancreas: Atrophic.  No ductal dilatation or inflammation. Spleen: Normal. Adrenal glands: No nodule.  Mild thickening on the left. Kidneys: Motion artifact through the lower right kidney. No hydronephrosis or urolithiasis. Mild nonspecific bilateral perinephric edema. Stomach/Bowel: Stomach physiologically distended. There are no dilated or thickened small bowel loops. Moderate volume of stool throughout the colon without colonic wall thickening. The appendix is normal. Vascular/Lymphatic: No retroperitoneal adenopathy, portions obscured by motion. Abdominal aorta is normal in caliber. Moderate atherosclerosis without aneurysm. Reproductive: Uterus remains in situ.  No evidence of adnexal mass. Bladder: Mildly distended but with diffuse wall thickening. Mild perivesicular edema. Other: No free air, free fluid, or intra-abdominal fluid collection. Musculoskeletal: Soft tissue stranding about iliacus muscle and left hip adductor musculature, better appreciated on MRI. The additional areas of muscle edema are not as well characterized. No soft tissue air or developing fluid collection allowing for noncontrast exam. There are no acute or suspicious osseous abnormalities. 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 No bony destructive change. IMPRESSION: 1. Urinary bladder wall thickening and perivesicular edema,  suggestive urinary tract infection. No urolithiasis or hydronephrosis. 2. Left iliopsoas and hip adductor muscle edema, better appreciated on prior MRI. No developing fluid collections or soft tissue air. Electronically Signed   By: Jeb Levering M.D.   On: 09/16/2015 22:46   Dg Hip Unilat With Pelvis 2-3 Views Left  09/13/2015  CLINICAL DATA:  Worsening left hip pain over the last 3 days. No known injury. EXAM: DG HIP (WITH OR WITHOUT PELVIS) 2-3V LEFT COMPARISON:  Pelvic radiographs 12/13/2009. FINDINGS: There is progressive osteopenia. No evidence of proximal left femur fracture, dislocation or femoral head avascular necrosis. Best seen on the AP view of the left hip is suggested lucency involving the left superior pubic ramus, potentially reflecting a stress fracture. No displaced fracture identified. The inferior pubic ramus appears normal. The sacroiliac joints are intact. IMPRESSION: Generalized osteopenia with potential stress fracture of the left superior pubic ramus. The proximal left femur appears intact. Electronically Signed   By: Richardean Sale M.D.   On: 09/13/2015 09:40    2-D echo LV EF: 60% - 65%  ------------------------------------------------------------------- Indications: Murmur 785.2.  ------------------------------------------------------------------- History: Risk factors: Hypertension. Diabetes mellitus.  ------------------------------------------------------------------- Study Conclusions  - Left ventricle: The cavity size was normal. There was moderate  concentric hypertrophy. Systolic function was normal. The  estimated ejection fraction was in the range of 60% to 65%. Wall  motion was normal; there were no regional wall motion  abnormalities. Doppler parameters are consistent with  abnormal  left ventricular relaxation (grade 1 diastolic dysfunction). - Aortic valve: Trileaflet; mildly thickened, mildly calcified  leaflets. - Mitral  valve: Moderately calcified annulus. - Tricuspid valve: There was mild regurgitation.   There were no vitals filed for this visit.   Microbiology: Recent Results (from the past 240 hour(s))  Urine culture     Status: None   Collection Time: 09/13/15  2:58 PM  Result Value Ref Range Status   Specimen Description URINE, CLEAN CATCH  Final   Special Requests Normal  Final   Culture   Final    >=100,000 COLONIES/mL METHICILLIN RESISTANT STAPHYLOCOCCUS AUREUS   Report Status 09/16/2015 FINAL  Final   Organism ID, Bacteria METHICILLIN RESISTANT STAPHYLOCOCCUS AUREUS  Final      Susceptibility   Methicillin resistant staphylococcus aureus - MIC*    CIPROFLOXACIN <=0.5 SENSITIVE Sensitive     GENTAMICIN <=0.5 SENSITIVE Sensitive     NITROFURANTOIN <=16 SENSITIVE Sensitive     OXACILLIN >=4 RESISTANT Resistant     TETRACYCLINE <=1 SENSITIVE Sensitive     VANCOMYCIN <=0.5 SENSITIVE Sensitive     TRIMETH/SULFA <=10 SENSITIVE Sensitive     CLINDAMYCIN <=0.25 SENSITIVE Sensitive     RIFAMPIN <=0.5 SENSITIVE Sensitive     Inducible Clindamycin NEGATIVE Sensitive     * >=100,000 COLONIES/mL METHICILLIN RESISTANT STAPHYLOCOCCUS AUREUS  Urine culture     Status: None   Collection Time: 09/16/15  4:37 PM  Result Value Ref Range Status   Specimen Description URINE, RANDOM  Final   Special Requests NONE  Final   Culture MULTIPLE SPECIES PRESENT, SUGGEST RECOLLECTION  Final   Report Status 09/18/2015 FINAL  Final  Culture, blood (Routine X 2) w Reflex to ID Panel     Status: None   Collection Time: 09/17/15 12:00 PM  Result Value Ref Range Status   Specimen Description BLOOD LEFT ANTECUBITAL  Final   Special Requests BOTTLES DRAWN AEROBIC AND ANAEROBIC 10CC  Final   Culture  Setup Time   Final    GRAM POSITIVE COCCI IN CLUSTERS ANAEROBIC BOTTLE ONLY CRITICAL RESULT CALLED TO, READ BACK BY AND VERIFIED WITH: Star Age [phone] [place]    Culture METHICILLIN RESISTANT STAPHYLOCOCCUS  AUREUS  Final   Report Status 09/20/2015 FINAL  Final   Organism ID, Bacteria METHICILLIN RESISTANT STAPHYLOCOCCUS AUREUS  Final      Susceptibility   Methicillin resistant staphylococcus aureus - MIC*    CIPROFLOXACIN <=0.5 SENSITIVE Sensitive     ERYTHROMYCIN <=0.25 SENSITIVE Sensitive     GENTAMICIN <=0.5 SENSITIVE Sensitive     OXACILLIN >=4 RESISTANT Resistant     TETRACYCLINE <=1 SENSITIVE Sensitive     VANCOMYCIN <=0.5 SENSITIVE Sensitive     TRIMETH/SULFA <=10 SENSITIVE Sensitive     CLINDAMYCIN <=0.25 SENSITIVE Sensitive     RIFAMPIN <=0.5 SENSITIVE Sensitive     Inducible Clindamycin NEGATIVE Sensitive     * METHICILLIN RESISTANT STAPHYLOCOCCUS AUREUS  Culture, blood (Routine X 2) w Reflex to ID Panel     Status: None (Preliminary result)   Collection Time: 09/17/15 12:15 PM  Result Value Ref Range Status   Specimen Description BLOOD BLOOD LEFT HAND  Final   Special Requests BOTTLES DRAWN AEROBIC AND ANAEROBIC 10CC  Final   Culture NO GROWTH 4 DAYS  Final   Report Status PENDING  Incomplete  MRSA PCR Screening     Status: Abnormal   Collection Time: 09/19/15  7:34 AM  Result Value Ref Range Status  MRSA by PCR POSITIVE (A) NEGATIVE Final    Comment:        The GeneXpert MRSA Assay (FDA approved for NASAL specimens only), is one component of a comprehensive MRSA colonization surveillance program. It is not intended to diagnose MRSA infection nor to guide or monitor treatment for MRSA infections. RESULT CALLED TO, READ BACK BY AND VERIFIED WITH: Jefferson Fuel RN 9:40 09/19/15 ([email or web address])   Culture, blood (routine x 2)     Status: None (Preliminary result)   Collection Time: 09/19/15 12:45 PM  Result Value Ref Range Status   Specimen Description BLOOD LEFT HAND  Final   Special Requests IN PEDIATRIC BOTTLE 2CC  Final   Culture NO GROWTH 2 DAYS  Final   Report Status PENDING  Incomplete  Culture, blood (routine x 2)     Status: None (Preliminary result)   Collection  Time: 09/19/15 12:55 PM  Result Value Ref Range Status   Specimen Description BLOOD LEFT HAND  Final   Special Requests IN PEDIATRIC BOTTLE 2.5CC  Final   Culture NO GROWTH 2 DAYS  Final   Report Status PENDING  Incomplete       Blood Culture    Component Value Date/Time   SDES BLOOD LEFT HAND 09/19/2015 1255   SPECREQUEST IN PEDIATRIC BOTTLE 2.5CC 09/19/2015 1255   CULT NO GROWTH 2 DAYS 09/19/2015 1255   REPTSTATUS PENDING 09/19/2015 1255      Labs: Results for orders placed or performed during the hospital encounter of 09/16/15 (from the past 48 hour(s))  Glucose, capillary     Status: Abnormal   Collection Time: 09/20/15 12:43 PM  Result Value Ref Range   Glucose-Capillary 133 (H) 65 - 99 mg/dL  Glucose, capillary     Status: None   Collection Time: 09/20/15  6:08 PM  Result Value Ref Range   Glucose-Capillary 91 65 - 99 mg/dL  Glucose, capillary     Status: Abnormal   Collection Time: 09/20/15  9:50 PM  Result Value Ref Range   Glucose-Capillary 106 (H) 65 - 99 mg/dL   Comment 1 Notify RN    Comment 2 Document in Chart   Vancomycin, trough     Status: Abnormal   Collection Time: 09/21/15  5:23 AM  Result Value Ref Range   Vancomycin Tr 5 (L) 10.0 - 20.0 ug/mL  Glucose, capillary     Status: Abnormal   Collection Time: 09/21/15  8:02 AM  Result Value Ref Range   Glucose-Capillary 133 (H) 65 - 99 mg/dL  Comprehensive metabolic panel     Status: Abnormal   Collection Time: 09/21/15  9:46 AM  Result Value Ref Range   Sodium 140 135 - 145 mmol/L   Potassium 3.6 3.5 - 5.1 mmol/L   Chloride 102 101 - 111 mmol/L   CO2 26 22 - 32 mmol/L   Glucose, Bld 110 (H) 65 - 99 mg/dL   BUN <5 (L) 6 - 20 mg/dL   Creatinine, Ser 0.44 0.44 - 1.00 mg/dL   Calcium 8.4 (L) 8.9 - 10.3 mg/dL   Total Protein 5.4 (L) 6.5 - 8.1 g/dL   Albumin 1.7 (L) 3.5 - 5.0 g/dL   AST 31 15 - 41 U/L   ALT 20 14 - 54 U/L   Alkaline Phosphatase 124 38 - 126 U/L   Total Bilirubin 0.7 0.3 - 1.2 mg/dL    GFR calc non Af Amer >60 >60 mL/min   GFR calc Af Amer >60 >60 mL/min  Comment: (NOTE) The eGFR has been calculated using the CKD EPI equation. This calculation has not been validated in all clinical situations. eGFR's persistently <60 mL/min signify possible Chronic Kidney Disease.    Anion gap 12 5 - 15  Magnesium     Status: None   Collection Time: 09/21/15  9:46 AM  Result Value Ref Range   Magnesium 1.7 1.7 - 2.4 mg/dL  Glucose, capillary     Status: None   Collection Time: 09/21/15 12:07 PM  Result Value Ref Range   Glucose-Capillary 94 65 - 99 mg/dL  Glucose, capillary     Status: Abnormal   Collection Time: 09/21/15  5:05 PM  Result Value Ref Range   Glucose-Capillary 164 (H) 65 - 99 mg/dL  Glucose, capillary     Status: Abnormal   Collection Time: 09/21/15  8:54 PM  Result Value Ref Range   Glucose-Capillary 114 (H) 65 - 99 mg/dL  CBC     Status: Abnormal   Collection Time: 09/22/15  5:25 AM  Result Value Ref Range   WBC 11.9 (H) 4.0 - 10.5 K/uL   RBC 4.13 3.87 - 5.11 MIL/uL   Hemoglobin 12.2 12.0 - 15.0 g/dL   HCT 36.2 36.0 - 46.0 %   MCV 87.7 78.0 - 100.0 fL   MCH 29.5 26.0 - 34.0 pg   MCHC 33.7 30.0 - 36.0 g/dL   RDW 13.8 11.5 - 15.5 %   Platelets 563 (H) 150 - 400 K/uL  Comprehensive metabolic panel     Status: Abnormal   Collection Time: 09/22/15  5:25 AM  Result Value Ref Range   Sodium 137 135 - 145 mmol/L   Potassium 4.4 3.5 - 5.1 mmol/L    Comment: DELTA CHECK NOTED   Chloride 101 101 - 111 mmol/L   CO2 28 22 - 32 mmol/L   Glucose, Bld 140 (H) 65 - 99 mg/dL   BUN <5 (L) 6 - 20 mg/dL   Creatinine, Ser 0.43 (L) 0.44 - 1.00 mg/dL   Calcium 8.6 (L) 8.9 - 10.3 mg/dL   Total Protein 5.7 (L) 6.5 - 8.1 g/dL   Albumin 1.7 (L) 3.5 - 5.0 g/dL   AST 25 15 - 41 U/L   ALT 21 14 - 54 U/L   Alkaline Phosphatase 122 38 - 126 U/L   Total Bilirubin 0.5 0.3 - 1.2 mg/dL   GFR calc non Af Amer >60 >60 mL/min   GFR calc Af Amer >60 >60 mL/min    Comment:  (NOTE) The eGFR has been calculated using the CKD EPI equation. This calculation has not been validated in all clinical situations. eGFR's persistently <60 mL/min signify possible Chronic Kidney Disease.    Anion gap 8 5 - 15  Glucose, capillary     Status: Abnormal   Collection Time: 09/22/15  8:04 AM  Result Value Ref Range   Glucose-Capillary 129 (H) 65 - 99 mg/dL     Lipid Panel  No results found for: CHOL, TRIG, HDL, CHOLHDL, VLDL, LDLCALC, LDLDIRECT   Lab Results  Component Value Date   HGBA1C 13.0* 09/20/2015   HGBA1C 12.7 07/01/2014   HGBA1C 11.0 06/30/2013     Lab Results  Component Value Date   CREATININE 0.43* 09/22/2015     HPI : 70 y.o. female with history of diabetes mellitus and hypertension. She is Spanish-speaking and HPI obtained with assistance of the language interpreter phone. She is here because of left hip pain for approximately 10 days prior  to admission. She was initially evaluated in the ED on 09/13/15 for her left hip pain where MRI showed unusual appearance of abnormal muscular edema involving the iliopsoas, left hip adductor musculature, and proximal left vastus musculature, but without underlying bony abnormality. There was no evidence of abscess and she was discharged home. She came back to the ED on 3/10 with worsening groin and hip pain being unable to ambulate. She also began to have worsening hematuria, fatigue, and dysuria. She was admitted to the hospital. CT renal stone study this admission showed urinary bladder thickening with no urolithiasis or hydronephrosis. This study also showed left iliopsoas and hip adductor muscle edema with no developing fluid collections or soft tissue air. Orthopedics evaluated patient and felt symptoms were most likely due to myositis with no signs of a septic hip clinically or by imaging. A urine culture from initial ED visit grew MRSA and repeat culture this admission with multiple species. She had blood  cultures drawn on 3/11 with Staph aureus growing in anaerobic bottle of one set. The second set is no growth at 2 days. Repeat blood cultures drawn this morning are currently processing. She had a transthoracic echocardiogram on 09/19/2015 that did not show any vegetations. She reports today that her hip and groin pain are still present but is a little better. She states she is still having some nausea but not vomiting. She remains fatigued and states has a poor appetite. She has no abdominal pain and groin pain continues with urination. She states the hematuria has improved. She had a leukocytosis of 13 on admission that is improved to 9.7 today. She has remained afebrile throughout course of admission.   HOSPITAL COURSE:  Left hip pain/infectious myositis Patient underwent MRI recently which showed abnormal appearance of the musculature including the iliopsoas and other muscle groups. CT concerning for myositis. No abscess was noted. In view of bacteremia and presence of MRSA in the urine, infectious myositis was diagnosed. CK level was normal. CRP is 27.1. ESR is 88. Patient was given a course of steroids with only minimal improvement. Seen by orthopedics this admission. No fractures identified on CT scan.  Repeat blood cultures from 09/19/15 are still remaining no growth.plan on at least 4 weeks of IV vancomycin through 10/15/2015. Follow-up with Dr. Michel Bickers  MRSA Bacteremia One out of 2 sets is noted to be growing MRSA. Continue vancomycin. Patient was noted to have MRSA on urine culture reports from March 7. Echocardiogram report is as above. No concerning findings appreciated. ID is following. Infectious disease recommends minimum 3-4 weeks of treatment. TEE? PICC line   placed . Patient discharged home with home health  Urinary tract infection Urine culture from March 7 revealed MRSA. Patient was placed on ceftriaxone. Urine cultures from this hospitalization showed multiple species.  Ceftriaxone has been discontinued. CT scan of the abdomen and pelvis does not reveal any abscess.  History of essential hypertension Monitor blood pressures closely.   History of diabetes mellitus type 2, poorly controlled Continue SSI. Resume metformin and Amaryl. On Lantus during this hospitalization. HbA1c 13  Hypokalemia and hypomagnesemia Repleted.  Normocytic anemia Anemia panel reviewed. No clear evidence for iron deficiency.   Discharge Exam:    Blood pressure 170/83, pulse 89, temperature 98.5 F (36.9 C), temperature source Oral, resp. rate 18, SpO2 93 %.  General appearance: alert, cooperative and no distress Resp: clear to auscultation bilaterally Cardio: S1, S2 is normal, regular. No S3, S4. Systolic murmur appreciated over the  precordium. GI: soft, non-tender; bowel sounds normal; no masses, no organomegaly Extremities: Restricted range of motion of the left hip. No obvious swelling is appreciated. Neurologic: Awake and alert. No focal neurological deficits.    Follow-up Information    Follow up with [facility] On 09/27/2015.   Why:  Hospital follow-up appointment on 09/27/15 at 2:00 pm with Dr. Janne Napoleon.   Contact information:   [address] [place] [address] [phone]      Follow up with [facility].   Why:  Rolling walker arranged , will be delivered to bedside   Contact information:   [place] [address] [phone]       Signed: Reyne Dumas 09/22/2015, 10:41 AM        Time spent >45 mins

## 2015-09-24 LAB — CULTURE, BLOOD (ROUTINE X 2)
CULTURE: NO GROWTH
Culture: NO GROWTH

## 2015-09-26 ENCOUNTER — Telehealth: Payer: Self-pay

## 2015-09-26 NOTE — Telephone Encounter (Signed)
Attempted to contact the patient to remind her of her appointment at [facility] tomorrow, 09/27/15 @ 1400 and to inquire if the patient needs transportation to the clinic.  Call placed to # [phone] with the assistance of Hulen Skains interpreter # [phone] with [employer].  The message noted that the number is not valid. Lowella Dandy then placed a call to # [phone] (H) and there was no answer and no option for leaving a message.

## 2015-09-27 ENCOUNTER — Ambulatory Visit: Payer: Self-pay | Attending: Internal Medicine | Admitting: Internal Medicine

## 2015-09-27 VITALS — BP 107/66 | HR 98 | Temp 97.5°F | Resp 15 | Ht <= 58 in | Wt 89.6 lb

## 2015-09-27 DIAGNOSIS — R7881 Bacteremia: Secondary | ICD-10-CM

## 2015-09-27 DIAGNOSIS — Z7984 Long term (current) use of oral hypoglycemic drugs: Secondary | ICD-10-CM | POA: Insufficient documentation

## 2015-09-27 DIAGNOSIS — R5383 Other fatigue: Secondary | ICD-10-CM | POA: Insufficient documentation

## 2015-09-27 DIAGNOSIS — E1165 Type 2 diabetes mellitus with hyperglycemia: Secondary | ICD-10-CM

## 2015-09-27 DIAGNOSIS — Z79899 Other long term (current) drug therapy: Secondary | ICD-10-CM | POA: Insufficient documentation

## 2015-09-27 DIAGNOSIS — I10 Essential (primary) hypertension: Secondary | ICD-10-CM

## 2015-09-27 DIAGNOSIS — R112 Nausea with vomiting, unspecified: Secondary | ICD-10-CM

## 2015-09-27 DIAGNOSIS — Z794 Long term (current) use of insulin: Secondary | ICD-10-CM | POA: Insufficient documentation

## 2015-09-27 DIAGNOSIS — B9561 Methicillin susceptible Staphylococcus aureus infection as the cause of diseases classified elsewhere: Secondary | ICD-10-CM

## 2015-09-27 LAB — CBC WITH DIFFERENTIAL/PLATELET
BASOS ABS: 0.1 10*3/uL (ref 0.0–0.1)
BASOS PCT: 1 % (ref 0–1)
Eosinophils Absolute: 0.1 10*3/uL (ref 0.0–0.7)
Eosinophils Relative: 1 % (ref 0–5)
HEMATOCRIT: 33.5 % — AB (ref 36.0–46.0)
HEMOGLOBIN: 11.2 g/dL — AB (ref 12.0–15.0)
LYMPHS PCT: 11 % — AB (ref 12–46)
Lymphs Abs: 1.2 10*3/uL (ref 0.7–4.0)
MCH: 28.8 pg (ref 26.0–34.0)
MCHC: 33.4 g/dL (ref 30.0–36.0)
MCV: 86.1 fL (ref 78.0–100.0)
MONO ABS: 0.5 10*3/uL (ref 0.1–1.0)
MPV: 8.7 fL (ref 8.6–12.4)
Monocytes Relative: 5 % (ref 3–12)
NEUTROS ABS: 8.9 10*3/uL — AB (ref 1.7–7.7)
NEUTROS PCT: 82 % — AB (ref 43–77)
Platelets: 711 10*3/uL — ABNORMAL HIGH (ref 150–400)
RBC: 3.89 MIL/uL (ref 3.87–5.11)
RDW: 13.4 % (ref 11.5–15.5)
WBC: 10.8 10*3/uL — AB (ref 4.0–10.5)

## 2015-09-27 LAB — POCT GLYCOSYLATED HEMOGLOBIN (HGB A1C): HEMOGLOBIN A1C: 11.7

## 2015-09-27 LAB — GLUCOSE, POCT (MANUAL RESULT ENTRY): POC GLUCOSE: 151 mg/dL — AB (ref 70–99)

## 2015-09-27 MED ORDER — TRAMADOL HCL 50 MG PO TABS
50.0000 mg | ORAL_TABLET | Freq: Three times a day (TID) | ORAL | Status: DC | PRN
Start: 2015-09-27 — End: 2015-10-31

## 2015-09-27 MED ORDER — AMLODIPINE BESYLATE 5 MG PO TABS: 5.0000 mg | ORAL_TABLET | Freq: Every day | ORAL | Status: DC

## 2015-09-27 MED ORDER — PROMETHAZINE HCL 12.5 MG PO TABS: 12.5000 mg | ORAL_TABLET | Freq: Three times a day (TID) | ORAL | Status: DC | PRN

## 2015-09-27 NOTE — Progress Notes (Signed)
Emily Hoover, is a 70 y.o. female  [id number]  [id number]  DOB - [birth year]  CC:  Chief Complaint  Patient presents with  . Hospitalization Follow-up       HPI: Emily Hoover is a 70 y.o. female here today to establish medical care.  Recent hospitalization 3/10-3/16 for MRSA bacteremia, left hip abductor muscle myositis, dm, htn, and UTI.  She has not seen a pcp in long time.  Since dc Thursday, pt /family states that she has been very lethargic, not eating much and sleeping throughout day.  They have been trying to get her to drink 2 Glucerna protein shakes a day.  Pt admits to not having any appetite.  Of note, she has been fairly bedbound since discharge and wearing depends. Family has been restricting fluids b/c of frequent urination.   intermittant n/v, small episode this am.   Patient has No headache, f/c,  No chest pain, No abdominal pain , No new weakness tingling or numbness, No Cough - SOB.  Pt's dgt and grandson is present.  Initially had Spanish interpreter, but now grandson is doing very well interpreting.   No Known Allergies Past Medical History  Diagnosis Date  . Diabetes mellitus   . Hypertension    Current Outpatient Prescriptions on File Prior to Visit  Medication Sig Dispense Refill  . HYDROcodone-acetaminophen (NORCO/VICODIN) 5-325 MG tablet Take 1-2 tablets by mouth every 4 (four) hours as needed for moderate pain. 30 tablet 0  . insulin glargine (LANTUS) 100 UNIT/ML injection Inject 0.1 mLs (10 Units total) into the skin at bedtime. 10 mL 11  . metFORMIN (GLUCOPHAGE) 500 MG tablet TAKE ONE TABLET BY MOUTH TWICE DAILY WITH FOOD (NEEDS OFFICE VISIT FOR REFILLS" (Patient taking differently: Take 1,000 mg by mouth 2 (two) times daily with a meal. TAKE ONE TABLET BY MOUTH TWICE DAILY WITH FOOD (NEEDS OFFICE VISIT FOR REFILLS") 60 tablet 0  . Vancomycin (VANCOCIN) 750 MG/150ML SOLN Inject 150 mLs (750 mg total) into the vein every 12 (twelve) hours. 1 mL 42    No current facility-administered medications on file prior to visit.   Family History  Problem Relation Age of Onset  . CAD Mother   . CAD Father   . Hypertension Father   . Diabetes Neg Hx   . Cancer Neg Hx    Social History   Social History  . Marital Status: Married    Spouse Name: N/A  . Number of Children: N/A  . Years of Education: N/A   Occupational History  . Not on file.   Social History Main Topics  . Smoking status: Never Smoker   . Smokeless tobacco: Not on file  . Alcohol Use: No  . Drug Use: No  . Sexual Activity: Not on file   Other Topics Concern  . Not on file   Social History Narrative    Review of Systems: Constitutional: Negative for fever, chills, diaphoresis, activity change, + decrease appetite change, increase fatigue. +weight loss, more lethargic/tired since discharge. HENT: Negative for ear pain, nosebleeds, congestion, facial swelling, rhinorrhea, neck pain, neck stiffness and ear discharge.  Eyes: Negative for pain, discharge, redness, itching and visual disturbance. Respiratory: Negative for cough, choking, chest tightness, shortness of breath, wheezing and stridor.  Cardiovascular: Negative for chest pain, palpitations and leg swelling. Gastrointestinal: Negative for abdominal distention.  +diapers, bms good, no appetite, Genitourinary: Negative for dysuria, urgency, frequency, hematuria, flank pain, decreased urine volume, difficulty urinating and dyspareunia.  Musculoskeletal: Negative for  back pain, joint swelling, arthralgia and gait problem.  +weak Neurological: Negative for dizziness, tremors, seizures, syncope, facial asymmetry, speech difficulty, weakness, light-headedness, numbness and headaches.  Hematological: Negative for adenopathy. Does not bruise/bleed easily. Psychiatric/Behavioral: Negative for hallucinations, behavioral problems, confusion, dysphoric mood, decreased concentration and agitation.    Objective:   Filed  Vitals:   09/27/15 1422  BP: 107/66  Pulse: 98  Temp: 97.5 F (36.4 C)  Resp: 15   Weight loss 113 in hospital to 89 lbs now?  Physical Exam: Constitutional: Patient appears thin, ill, fatigued, but not toxic.  Drowsy, but able to communicate with family.  Sitting in wheelchair. HENT: Normocephalic, atraumatic, External right and left ear normal. Oropharynx dry. Eyes: Conjunctivae and EOM are normal. PERRL, no scleral icterus. Neck: Normal ROM. Neck supple. No JVD. No tracheal deviation. No thyromegaly. CVS: RRR, S1/S2 +, no murmurs, no gallops, no carotid bruit.  Borderline tachycardic, no c/c/e le bilat. Pulmonary: Effort and breath sounds normal, no stridor, rhonchi, wheezes, rales.  Abdominal: Soft. BS +, no distension, tenderness, rebound or guarding.  Musculoskeletal: Normal range of motion. Mild ttp left inner thigh, no palpable masses. Lymphadenopathy: No lymphadenopathy noted, cervical, inguinal or axillary Neuro: drowsy, but easily arousable to verbal stimuli.  aaox.3 Skin: Skin is warm and dry. No rash noted. Not diaphoretic. No erythema.    Lab Results  Component Value Date   WBC 11.9* 09/22/2015   HGB 12.2 09/22/2015   HCT 36.2 09/22/2015   MCV 87.7 09/22/2015   PLT 563* 09/22/2015   Lab Results  Component Value Date   CREATININE 0.43* 09/22/2015   BUN <5* 09/22/2015   NA 137 09/22/2015   K 4.4 09/22/2015   CL 101 09/22/2015   CO2 28 09/22/2015    Lab Results  Component Value Date   HGBA1C 11.7 09/27/2015   Lipid Panel  No results found for: CHOL, TRIG, HDL, CHOLHDL, VLDL, LDLCALC  accuchh reviewed from home ranging 90s - 150s.     Assessment and plan:   1. Type 2 diabetes mellitus with hyperglycemia, without long-term current use of insulin (HCC) Control appears improving, but very poor intake. Pt currently not taking glimepirde, which I recd stopping for now anyway given risk hypoglycemia/poor po intake.  - continue metformin and lantus 10 - will  chk bmp/vanco levels today, may be dry. - Glucose (CBG) 151 - POCT glycosylated hemoglobin (Hb A1C)  11.7  2. Essential hypertension - soft bps today, suspect weightloss/malnutrtion, will reduce norvasc to 6.  Family instructed to cut 10mg  tab in half. - amLODipine (NORVASC) 5 MG tablet; Take 1 tablet (5 mg total) by mouth daily.  Dispense: 90 tablet; Refill: 3  3. Staphylococcus aureus bacteremia With lethargy, on vancomycin, decrease po intake. Will chk bmp/random vanco levels today. - reduce pain rx, ultram for less sedation.  Only use norco if severe pain. - traMADol (ULTRAM) 50 MG tablet; Take 1 tablet (50 mg total) by mouth every 8 (eight) hours as needed.  Dispense: 30 tablet; Refill: 0 - Basic Metabolic Panel - Vancomycin, random - CBC with Differential  4. Nausea and vomiting, intractability of vomiting not specified, unspecified vomiting type - ? 2nd to poor po, meds, dehydration, infection? - phenergan prn - labs today.   Return in about 2 weeks (around 10/11/2015).  The patient was given clear instructions to go to ER or return to medical center if symptoms don't improve, worsen or new problems develop. The patient verbalized understanding. The patient was told  to call to get lab results if they haven't heard anything in the next week.     Maren Reamer, MD, [place], [place]   09/27/2015, 2:43 PM

## 2015-09-27 NOTE — Patient Instructions (Addendum)
Labs today Pick up meds/  Financial services   Fu 2 wks Please talk to your home health rn tomorrow and check with them on vancomycin supply, Home health services should be providing it.  They can call me if they have questions.

## 2015-09-27 NOTE — Progress Notes (Signed)
Patient here with her grandson and daughter for hospital follow up visit Family states patient is very sleepy and not eating or drinking very well She rates her pain at 4/10 now Home health RN coming tomorrow to change PICC line dressing

## 2015-09-28 ENCOUNTER — Telehealth: Payer: Self-pay | Admitting: *Deleted

## 2015-09-28 LAB — VANCOMYCIN, RANDOM: Vancomycin Rm: 14.7 mg/L

## 2015-09-28 LAB — BASIC METABOLIC PANEL
BUN: 14 mg/dL (ref 7–25)
CHLORIDE: 97 mmol/L — AB (ref 98–110)
CO2: 24 mmol/L (ref 20–31)
CREATININE: 0.55 mg/dL — AB (ref 0.60–0.93)
Calcium: 8.8 mg/dL (ref 8.6–10.4)
Glucose, Bld: 142 mg/dL — ABNORMAL HIGH (ref 65–99)
Potassium: 3.9 mmol/L (ref 3.5–5.3)
Sodium: 136 mmol/L (ref 135–146)

## 2015-09-28 NOTE — Telephone Encounter (Signed)
-----   Message from Maren Reamer, MD sent at 09/28/2015  9:42 AM EDT ----- Please call patient with labs results. All labs decent range, vancomycin random level was normal. No signs of kidney damage/renal dysfunction currently, continue to encourage drinking fluids though.  She remains anemia, but iron pills not recommended at this time due to her active infection.  We will readdress anemia after done with iv antibiotics.    Were the able to get the iv antibiotics? Thanks.

## 2015-09-28 NOTE — Telephone Encounter (Signed)
[place] interpreter id [phone]  Results and instructions given to the patient.

## 2015-10-03 ENCOUNTER — Telehealth: Payer: Self-pay | Admitting: *Deleted

## 2015-10-03 NOTE — Telephone Encounter (Signed)
-----   Message from Maren Reamer, MD sent at 09/28/2015  9:42 AM EDT ----- Please call patient with labs results. All labs decent range, vancomycin random level was normal. No signs of kidney damage/renal dysfunction currently, continue to encourage drinking fluids though.  She remains anemia, but iron pills not recommended at this time due to her active infection.  We will readdress anemia after done with iv antibiotics.    Were the able to get the iv antibiotics? Thanks.

## 2015-10-03 NOTE — Telephone Encounter (Signed)
[place]  Name and date of birth verified Results given and transferred to front to make appointment for follow up around April 4

## 2015-10-04 ENCOUNTER — Encounter: Payer: Self-pay | Admitting: Neurology

## 2015-10-04 ENCOUNTER — Telehealth: Payer: Self-pay | Admitting: *Deleted

## 2015-10-04 ENCOUNTER — Ambulatory Visit: Payer: Self-pay | Admitting: Internal Medicine

## 2015-10-04 ENCOUNTER — Ambulatory Visit (INDEPENDENT_AMBULATORY_CARE_PROVIDER_SITE_OTHER): Payer: Medicaid Other | Admitting: Neurology

## 2015-10-04 ENCOUNTER — Telehealth: Payer: Self-pay | Admitting: Internal Medicine

## 2015-10-04 VITALS — BP 120/68 | HR 68 | Ht 59.0 in | Wt 86.0 lb

## 2015-10-04 DIAGNOSIS — H8110 Benign paroxysmal vertigo, unspecified ear: Secondary | ICD-10-CM

## 2015-10-04 DIAGNOSIS — E1310 Other specified diabetes mellitus with ketoacidosis without coma: Secondary | ICD-10-CM

## 2015-10-04 DIAGNOSIS — B9561 Methicillin susceptible Staphylococcus aureus infection as the cause of diseases classified elsewhere: Secondary | ICD-10-CM

## 2015-10-04 DIAGNOSIS — R7881 Bacteremia: Secondary | ICD-10-CM

## 2015-10-04 NOTE — Telephone Encounter (Signed)
Received labs via fax on patient with elevated platelets of 655 and vancomycin trough of 51.8. [facility] was not called on this alert. Patient has a future hospital follow up with Dr. Megan Salon on 10/13/15. Called [facility] and spoke to Vinnie Level, she will page Dr. Megan Salon. Myrtis Hopping

## 2015-10-04 NOTE — Progress Notes (Signed)
NEUROLOGY CONSULTATION NOTE  Emily Hoover MRN: [id number] DOB: Oct 14, 1945  Referring provider: Hospital referral Primary care provider: Hospital referral  Reason for consult:  vertigo  HISTORY OF PRESENT ILLNESS: Emily Hoover is a 70 year old Spanish-speaking female with type 2 diabetes and chronic neck pain who presents for vertigo.  History obtained by patient, her grandson, son and hospital notes. Interpretor is present.  Labs and imaging of MRI and CTA reviewed.  For many years, she has had intermittent episodes of dizziness, described as sense of room movement, but no actual spinning.  There is no associated lightheadedness.  She feels a pressure on the left side of her head.  It is positional.  It is associated with nausea and vomiting.  A month or two ago, she developed a recurrence of this dizziness.  She did not have hearing loss or ear pain.   There was no preceding viral illness (URI or GI).  She was also found to have uncontrolled diabetes with Hgb A1c of 13.6 and blood sugars running 300 to 400.  She had a UTI that was never treated, because she never picked up the antibiotic.  At some point, she was found to have Staph aureus bacteremia and was discharged on IV vancomycin.  She developed leg pain which she was told was related to the infection that localized to her leg.  I do not have notes elaborating on this.  However, her catheter had just occluded.  She presented to [facility] last month for the dizziness with nausea and vomiting.  CTA of the neck was performed to look for dissection, which showed minimal luminal irregularity of the vertebral arteries, likely due to atherosclerosis.  There was also left-sided facet arthropathy at C3-4.  MRI of the brain was normal.  She was diagnosed with labyrinthitis and prescribed meclizine and discharged on a prednisone taper.  The dizziness has since improved and is only mild now.  She is supposed to have home PT but they never  came.  PAST MEDICAL HISTORY: Past Medical History  Diagnosis Date  . Diabetes mellitus without complication ([place])   . Hypertension   . Hypercholesteremia     PAST SURGICAL HISTORY: No past surgical history on file.  MEDICATIONS: Current Outpatient Prescriptions on File Prior to Visit  Medication Sig Dispense Refill  . ibuprofen (ADVIL,MOTRIN) 200 MG tablet Take 200 mg by mouth every 6 (six) hours as needed for moderate pain.    . meclizine (ANTIVERT) 25 MG tablet Take 1 tablet (25 mg total) by mouth 3 (three) times daily as needed for dizziness. 15 tablet 0  . metFORMIN (GLUCOPHAGE) 500 MG tablet Take 2 tablets (1,000 mg total) by mouth 2 (two) times daily with a meal. 60 tablet 0   No current facility-administered medications on file prior to visit.    ALLERGIES: No Known Allergies  FAMILY HISTORY: Family History  Problem Relation Age of Onset  . Family history unknown: Yes    SOCIAL HISTORY: Social History   Social History  . Marital Status: Single    Spouse Name: N/A  . Number of Children: N/A  . Years of Education: N/A   Occupational History  . Not on file.   Social History Main Topics  . Smoking status: Never Smoker   . Smokeless tobacco: Not on file  . Alcohol Use: No  . Drug Use: Not on file  . Sexual Activity: Not on file   Other Topics Concern  . Not on file  Social History Narrative    REVIEW OF SYSTEMS: Constitutional: No fevers, chills, or sweats, no generalized fatigue, change in appetite Eyes: No visual changes, double vision, eye pain Ear, nose and throat: No hearing loss, ear pain, nasal congestion, sore throat Cardiovascular: No chest pain, palpitations Respiratory:  No shortness of breath at rest or with exertion, wheezes GastrointestinaI: Nausea improved. Genitourinary:  No dysuria, urinary retention or frequency Musculoskeletal:  No neck pain, back pain Integumentary: No rash, pruritus, skin lesions Neurological: as  above Psychiatric: No depression, insomnia, anxiety Endocrine: No palpitations, fatigue, diaphoresis, mood swings, change in appetite, change in weight, increased thirst Hematologic/Lymphatic:  No purpura, petechiae. Allergic/Immunologic: no itchy/runny eyes, nasal congestion, recent allergic reactions, rashes  PHYSICAL EXAM: Filed Vitals:   10/04/15 0840  BP: 120/68  Pulse: 68   General: No acute distress.  Patient appears well-groomed.  Thin Head:  Normocephalic/atraumatic Eyes:  fundi unremarkable, without vessel changes, exudates, hemorrhages or papilledema. Neck: supple, no paraspinal tenderness, full range of motion Back: No paraspinal tenderness Heart: regular rate and rhythm Lungs: Clear to auscultation bilaterally. Vascular: No carotid bruits. Neurological Exam: Mental status: alert and oriented to person, place, and time, recent and remote memory intact, fund of knowledge intact, attention and concentration intact, speech fluent and not dysarthric, language intact. Cranial nerves: CN I: not tested CN II: pupils equal, round and reactive to light, visual fields intact, fundi unremarkable, without vessel changes, exudates, hemorrhages or papilledema. CN III, IV, VI:  full range of motion, no nystagmus, no ptosis CN V: facial sensation intact CN VII: upper and lower face symmetric CN VIII: hearing intact CN IX, X: gag intact, uvula midline CN XI: sternocleidomastoid and trapezius muscles intact CN XII: tongue midline Bulk & Tone: normal, no fasciculations. Motor:  5-/5 throughout  Sensation:  Light touch intact. Deep Tendon Reflexes:  2+ throughout, except absent in ankles, toes downgoing.  Finger to nose testing:  Without dysmetria.  Gait:  Antalgic gait related to left leg pain.  Unable to tandem walk.  Able to turn.  Romberg with sway.  IMPRESSION: Benign paroxysmal positional vertigo, resolving.  Unlikely labyrinthitis, as this is a recurrent problem, and she says  she didn't have hearing loss or ear pain. Staph aureus bacteremia Uncontrolled diabetes  PLAN: If dizziness and nausea should recur, consider referral to vestibular rehab or ENT Told patient and family to contact PCP, Dr. Janne Napoleon, immediately to inform them that the catheter occluded and she cannot administer her antibiotics Recommend discussing with PCP that PT has not yet come to the house Follow up as needed.  Thank you for allowing me to take part in the care of this patient.  Metta Clines, DO  CC:  Lottie Mussel, MD

## 2015-10-04 NOTE — Telephone Encounter (Signed)
Morey Hummingbird from [facility] called requesting verbal orders for pt. To have a physical therapist evaluate the pt. For her dizziness and vertigo. Please f/u

## 2015-10-04 NOTE — Telephone Encounter (Signed)
Please call home health nurse that they can put in a verbal order for Physical therapy eval and treat. thx.

## 2015-10-04 NOTE — Progress Notes (Signed)
Chart forwarded.  

## 2015-10-04 NOTE — Patient Instructions (Signed)
The dizziness is vertigo, from the inner ear.  If it should recur, I would recommend her doctor refer her for vestibular rehabilitation or to see ear, nose and throat doctor. Call Dr. Jerrye Bushy office immediately regarding the catheter for the antibiotics.  She must continue her antibiotics Follow up as needed.

## 2015-10-05 ENCOUNTER — Telehealth: Payer: Self-pay

## 2015-10-05 NOTE — Telephone Encounter (Signed)
Returned phone call to [facility] at [facility] [phone] Verbal orders given P/T for vestibular rehab for this patient

## 2015-10-05 NOTE — Telephone Encounter (Signed)
She has developed supratherapeutic vancomycin levels after vancomycin dose was increased recently. She has developed some acute renal insufficiency. Her vancomycin has been on hold since yesterday. Repeat lab work today showed that her vancomycin random level was still elevated at 41.4. Her creatinine was up from her baseline to 1.69. She will have repeat labs on 10/07/2015. She is scheduled to see me next week.

## 2015-10-05 NOTE — Telephone Encounter (Signed)
Thank you :)

## 2015-10-07 ENCOUNTER — Telehealth: Payer: Self-pay | Admitting: Internal Medicine

## 2015-10-07 ENCOUNTER — Other Ambulatory Visit: Payer: Self-pay | Admitting: Internal Medicine

## 2015-10-07 NOTE — Telephone Encounter (Signed)
Pt. Called requesting syringes for her insulin. Please f/u with pt.

## 2015-10-10 ENCOUNTER — Encounter: Payer: Self-pay | Admitting: Internal Medicine

## 2015-10-10 ENCOUNTER — Telehealth: Payer: Self-pay | Admitting: *Deleted

## 2015-10-10 ENCOUNTER — Ambulatory Visit: Payer: Medicaid Other | Attending: Internal Medicine | Admitting: Internal Medicine

## 2015-10-10 VITALS — BP 143/77 | HR 83 | Temp 98.2°F | Resp 18 | Ht <= 58 in | Wt 95.0 lb

## 2015-10-10 DIAGNOSIS — I10 Essential (primary) hypertension: Secondary | ICD-10-CM | POA: Insufficient documentation

## 2015-10-10 DIAGNOSIS — Z79899 Other long term (current) drug therapy: Secondary | ICD-10-CM | POA: Insufficient documentation

## 2015-10-10 DIAGNOSIS — E1165 Type 2 diabetes mellitus with hyperglycemia: Secondary | ICD-10-CM | POA: Insufficient documentation

## 2015-10-10 DIAGNOSIS — R7881 Bacteremia: Secondary | ICD-10-CM

## 2015-10-10 DIAGNOSIS — B9561 Methicillin susceptible Staphylococcus aureus infection as the cause of diseases classified elsewhere: Secondary | ICD-10-CM | POA: Insufficient documentation

## 2015-10-10 DIAGNOSIS — H6123 Impacted cerumen, bilateral: Secondary | ICD-10-CM | POA: Insufficient documentation

## 2015-10-10 DIAGNOSIS — E0800 Diabetes mellitus due to underlying condition with hyperosmolarity without nonketotic hyperglycemic-hyperosmolar coma (NKHHC): Secondary | ICD-10-CM

## 2015-10-10 DIAGNOSIS — Z794 Long term (current) use of insulin: Secondary | ICD-10-CM | POA: Insufficient documentation

## 2015-10-10 DIAGNOSIS — R809 Proteinuria, unspecified: Secondary | ICD-10-CM | POA: Insufficient documentation

## 2015-10-10 DIAGNOSIS — D649 Anemia, unspecified: Secondary | ICD-10-CM | POA: Insufficient documentation

## 2015-10-10 DIAGNOSIS — N289 Disorder of kidney and ureter, unspecified: Secondary | ICD-10-CM | POA: Insufficient documentation

## 2015-10-10 DIAGNOSIS — E1121 Type 2 diabetes mellitus with diabetic nephropathy: Secondary | ICD-10-CM | POA: Insufficient documentation

## 2015-10-10 LAB — GLUCOSE, POCT (MANUAL RESULT ENTRY): POC GLUCOSE: 173 mg/dL — AB (ref 70–99)

## 2015-10-10 LAB — POCT GLYCOSYLATED HEMOGLOBIN (HGB A1C): HEMOGLOBIN A1C: 9.8

## 2015-10-10 MED ORDER — INSULIN GLARGINE 100 UNIT/ML ~~LOC~~ SOLN: 12.0000 [IU] | Freq: Every day | SUBCUTANEOUS | Status: DC

## 2015-10-10 MED ORDER — "INSULIN SYRINGE 30G X 5/16"" 1 ML MISC": Status: AC

## 2015-10-10 MED ORDER — "INSULIN SYRINGE 30G X 5/16"" 1 ML MISC": Status: DC

## 2015-10-10 MED FILL — !LANTUS 100 UNITS/ML VIAL: 100 | 30 days supply | Qty: 10 | Fill #0

## 2015-10-10 MED FILL — TRUEPLUS SYR 1ML 30GX5/16: 30G X 5/16" | 25 days supply | Qty: 100 | Fill #0

## 2015-10-10 NOTE — Progress Notes (Signed)
Emily Hoover, is a 70 y.o. female  [id number]  [id number]  DOB - Dec 01, 1945  Chief Complaint  Patient presents with  . Follow-up    2 week        Subjective:   Emily Hoover is a 70 y.o. female here today for a follow up visit.  Last seen 09/27/15 for MRSA bacteremia, ooc dm2.  Now presents for f/u. Doing much better since, eating and drinking more, feels better. Co of right ear pain, feels full, no ha/cp.  Some mild dizziness when gets up to fast, but denies syncope or room spinning.  Family at bedside.  Spanish interpreter via phone present as well.  Patient has No headache, No chest pain, No abdominal pain - No Nausea, No new weakness tingling or numbness, No Cough - SOB.  Problem  Renal Insufficiency    ALLERGIES: No Known Allergies  PAST MEDICAL HISTORY: Past Medical History  Diagnosis Date  . Diabetes mellitus   . Hypertension     MEDICATIONS AT HOME: Prior to Admission medications   Medication Sig Start Date End Date Taking? Authorizing Provider  amLODipine (NORVASC) 5 MG tablet Take 1 tablet (5 mg total) by mouth daily. 09/27/15  Yes Maren Reamer, MD  HYDROcodone-acetaminophen (NORCO/VICODIN) 5-325 MG tablet Take 1-2 tablets by mouth every 4 (four) hours as needed for moderate pain. 09/22/15  Yes Reyne Dumas, MD  insulin glargine (LANTUS) 100 UNIT/ML injection Inject 0.12 mLs (12 Units total) into the skin at bedtime. 10/10/15  Yes Maren Reamer, MD  Insulin Syringe-Needle U-100 (INSULIN SYRINGE 1CC/30GX5/16") 30G X 5/16" 1 ML MISC Check blood sugar TID & QHS 10/10/15  Yes Maren Reamer, MD  promethazine (PHENERGAN) 12.5 MG tablet Take 1 tablet (12.5 mg total) by mouth every 8 (eight) hours as needed for nausea or vomiting. 09/27/15  Yes Maren Reamer, MD  traMADol (ULTRAM) 50 MG tablet Take 1 tablet (50 mg total) by mouth every 8 (eight) hours as needed. 09/27/15  Yes Maren Reamer, MD  Vancomycin (VANCOCIN) 750 MG/150ML SOLN Inject 150 mLs  (750 mg total) into the vein every 12 (twelve) hours. 09/22/15 10/15/15 Yes Reyne Dumas, MD     Objective:   Filed Vitals:   10/10/15 1421  BP: 143/77  Pulse: 83  Temp: 98.2 F (36.8 C)  TempSrc: Oral  Resp: 18  Height: 4\' 9"  (1.448 m)  Weight: 95 lb (43.092 kg)  SpO2: 97%    Exam General appearance : Awake, alert, not in any distress. Speech Clear. Not toxic looking, appears much improved compared to last visit.   HEENT: Atraumatic and Normocephalic, pupils equally reactive to light.  Bilateral cerumen impaction. Neck: supple, no JVD. No cervical lymphadenopathy.  Chest:Good air entry bilaterally, no added sounds. CVS: S1 S2 regular, no murmurs/gallups or rubs. Abdomen: Bowel sounds active, Non tender and not distended with no gaurding, rigidity or rebound. Extremities: B/L Lower Ext shows no edema, both legs are warm to touch, RUE picc line in place, nttp. , no erythema/edema Neurology: Awake alert, and oriented X 3, CN II-XII grossly intact, Non focal Skin:No Rash  Data Review Lab Results  Component Value Date   HGBA1C 9.8 10/10/2015   HGBA1C 11.7 09/27/2015   HGBA1C 13.0* 09/20/2015     Assessment & Plan   1. Uncontrolled type 2 diabetes mellitus with diabetic nephropathy, unspecified long term insulin use status ([place]) - given recent renal insufficiency w/ vancomycin, will stop Metformin for now. - increased lantus 12  units sq qhs for now, will follow  - POCT A1C 142 - Glucose (CBG) 9.8, better  2. Staphylococcus aureus bacteremia - on vancomycin per ID, Dr Megan Salon, has appt 10/13/15  3. Essential hypertension Stable, consider norvasc 5  4. Renal insufficiency, recent, but suspect has DM nephropathy as well, given proteinuria on urinalysis. - hold metformin for now. - labs per ID  5. Proteinuria, noted on recent urine - tighter glucose control recd when acute illness/bactermia resolves.   7. Cerumen impaction, bilateral - Ear Lavage bilateral  today.  8. Anemia,  - suspect acute illness, will follow, may do well w/ iron, if iron deficient after acute illness. - mcv nml range.    Patient have been counseled extensively about nutrition and exercise  Return in about 3 weeks (around 10/31/2015).  The patient was given clear instructions to go to ER or return to medical center if symptoms don't improve, worsen or new problems develop. The patient verbalized understanding. The patient was told to call to get lab results if they haven't heard anything in the next week.     Maren Reamer, MD, [facility] [place], [place]   10/10/2015, 2:34 PM

## 2015-10-10 NOTE — Patient Instructions (Signed)
Pick up insulin at pharmacy - Stop Metformin  Infectious Disease Dr appt/ DR Megan Salon 10/13/15 - please give her info for fu.  La diabetes mellitus y los alimentos (Diabetes Mellitus and Food) Es importante que controle su nivel de azcar en la sangre (glucosa). El nivel de glucosa en sangre depende en gran medida de lo que usted come. Comer alimentos saludables en las cantidades [place] a lo largo del Training and development officer, aproximadamente a la misma hora [employer], lo ayudar a Chief Technology Officer su nivel de Multimedia programmer. Tambin puede ayudarlo a retrasar o Patent attorney de la diabetes mellitus. Comer de [employer] saludable incluso puede ayudarlo a Chartered loss adjuster de presin arterial y a Science writer o Theatre manager un peso saludable.  Entre las recomendaciones generales para alimentarse y Audiological scientist los alimentos de forma saludable, se incluyen las siguientes:  Respetar las comidas principales y comer colaciones con regularidad. Evitar pasar largos perodos sin comer con el fin de perder peso.  Seguir una dieta que consista principalmente en alimentos de origen vegetal, como frutas, vegetales, frutos secos, legumbres y cereales integrales.  Utilizar mtodos de coccin a baja temperatura, como hornear, en lugar de mtodos de coccin a alta temperatura, como frer en abundante aceite. Trabaje con el nutricionista para aprender a Financial planner nutricional de las etiquetas de los alimentos. CMO PUEDEN AFECTARME LOS ALIMENTOS? Carbohidratos Los carbohidratos afectan el nivel de glucosa en sangre ms que cualquier otro tipo de alimento. El nutricionista lo ayudar a Teacher, adult education cuntos carbohidratos puede consumir en cada comida y ensearle a contarlos. El recuento de carbohidratos es importante para mantener la glucosa en sangre en un nivel saludable, en especial si utiliza insulina o toma determinados medicamentos para la diabetes mellitus. Alcohol El alcohol puede provocar disminuciones sbitas de la glucosa  en sangre (hipoglucemia), en especial si utiliza insulina o toma determinados medicamentos para la diabetes mellitus. La hipoglucemia es una afeccin que puede poner en peligro la vida. Los sntomas de la hipoglucemia (somnolencia, mareos y Data processing manager) son similares a los sntomas de haber consumido mucho alcohol.  Si el mdico lo autoriza a beber alcohol, hgalo con moderacin y siga estas pautas:  Las mujeres no deben beber ms de un trago por da, y los hombres no deben beber ms de dos tragos por Training and development officer. Un trago es igual a:  12 onzas (355 ml) de cerveza  5 onzas de vino (150 ml) de vino  1,5onzas (8ml) de bebidas espirituosas  No beba con el estmago vaco.  Mantngase hidratado. Beba agua, gaseosas dietticas o t helado sin azcar.  Las gaseosas comunes, los jugos y otros refrescos podran contener muchos carbohidratos y se Civil Service fast streamer. QU ALIMENTOS NO SE RECOMIENDAN? Cuando haga las elecciones de alimentos, es importante que recuerde que todos los alimentos son distintos. Algunos tienen menos nutrientes que otros por porcin, aunque podran tener la misma cantidad de caloras o carbohidratos. Es difcil darle al cuerpo lo que necesita cuando consume alimentos con menos nutrientes. Estos son algunos ejemplos de alimentos que debera evitar ya que contienen muchas caloras y carbohidratos, pero pocos nutrientes:  Physicist, medical trans (la mayora de los alimentos procesados incluyen grasas trans en la etiqueta de Informacin nutricional).  Gaseosas comunes.  Jugos.  Caramelos.  Dulces, como tortas, pasteles, rosquillas y [place].  Comidas fritas. QU ALIMENTOS PUEDO COMER? Consuma alimentos ricos en nutrientes, que nutrirn el cuerpo y lo mantendrn saludable. Los alimentos que debe comer tambin dependern de varios factores, como:  Las caloras que necesita.  Los  medicamentos que toma.  Su peso.  El nivel de glucosa en [place].  El [place] de presin arterial.  El  nivel de colesterol. Debe consumir una amplia variedad de alimentos, por ejemplo:  Protenas.  Cortes de [employer].  Protenas con bajo contenido de grasas saturadas, como pescado, clara de huevo y frijoles. Evite las carnes procesadas.  Frutas y vegetales.  Frutas y Photographer que pueden ayudar a Chief Technology Officer los niveles sanguneos de [place], como [place], mangos y batatas.  Productos lcteos.  Elija productos lcteos sin grasa o con bajo contenido de [place], como [place], yogur y [place].  Cereales, panes, pastas y arroz.  Elija cereales integrales, como panes multicereales, avena en grano y arroz integral. Estos alimentos pueden ayudar a controlar la presin arterial.  Daphene Jaeger.  Alimentos que contengan grasas saludables, como frutos secos, Musician, aceite de [place], aceite de canola y pescado. TODOS LOS QUE PADECEN DIABETES MELLITUS TIENEN EL [place] PLAN DE [place]? Dado que todas las personas que padecen diabetes mellitus son distintas, no hay un solo plan de comidas que funcione para todos. Es muy importante que se rena con un nutricionista que lo ayudar a crear un plan de comidas adecuado para usted.   Esta informacin no tiene Marine scientist el consejo del mdico. Asegrese de hacerle al mdico cualquier pregunta que tenga.   Document Released: 10/02/2007 Document Revised: 07/16/2014 Elsevier Interactive Patient Education [employer].

## 2015-10-10 NOTE — Progress Notes (Signed)
Patient is here for 2 week FU  Patient complains of right ear pain beginning last night and pain increasing this morning. Patient states the feeling is "like something is in there". Discomfort is scaled currently at a 4.  Patient has taken DM medication and patient has eaten today.  Patient tolerated bilateral ear flushing well. Patient had large ear wax particles which flushed out with the water/peroxide. Patient denies any dizziness or pain upon discharge.

## 2015-10-10 NOTE — Telephone Encounter (Signed)
10/07/15 Vanc trough = 22.4, Creatinine = 1.49 received today @ [facility].  RN spoke to [place] at [facility].  Per Jeani Hawking, pharmacist, Dr. Megan Salon was informed and Vancomycin was "held."

## 2015-10-11 ENCOUNTER — Encounter: Payer: Self-pay | Admitting: Internal Medicine

## 2015-10-13 ENCOUNTER — Telehealth: Payer: Self-pay

## 2015-10-13 ENCOUNTER — Encounter: Payer: Self-pay | Admitting: Internal Medicine

## 2015-10-13 ENCOUNTER — Ambulatory Visit (INDEPENDENT_AMBULATORY_CARE_PROVIDER_SITE_OTHER): Payer: Self-pay | Admitting: Internal Medicine

## 2015-10-13 VITALS — BP 177/84 | HR 72 | Temp 97.7°F | Wt 92.0 lb

## 2015-10-13 DIAGNOSIS — M609 Myositis, unspecified: Secondary | ICD-10-CM | POA: Insufficient documentation

## 2015-10-13 MED ORDER — DOXYCYCLINE HYCLATE 100 MG PO TABS: 100.0000 mg | ORAL_TABLET | Freq: Two times a day (BID) | ORAL | Status: DC

## 2015-10-13 NOTE — Telephone Encounter (Signed)
Called Coretta at [facility] and gave Dr. Hale Bogus verbal order to draw weekly labs tomorrow(4/7)and to add a sed rate and c-reactive protein to testing. Also, to pull patient's PICC tomorrow. Candace Murray,LPN

## 2015-10-14 NOTE — Assessment & Plan Note (Signed)
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 She had transient vancomycin toxicity with acute renal insufficiency but this is resolving after vancomycin was held. I will stop her vancomycin now and switch her to oral doxycycline for 2 more weeks. Her PICC will be pulled. I will monitor her lab work and repeat her inflammatory markers. She will followup in 4-6 weeks.

## 2015-10-14 NOTE — Progress Notes (Signed)
Patient ID: Emily Hoover, female   DOB: August 09, 1945, 70 y.o.   MRN: [id number]         [facility] for Infectious Disease  Patient Active Problem List   Diagnosis Date Noted  . Myositis 10/13/2015    Priority: High  . Staphylococcus aureus bacteremia 09/19/2015    Priority: High  . Renal insufficiency 10/10/2015  . UTI (lower urinary tract infection) 09/16/2015  . Hip pain 09/16/2015  . Hypertension 09/16/2015  . Pain management 09/16/2015  . Vertigo 09/07/2015  . Labyrinthitis 09/07/2015  . Neck pain 09/07/2015  . Diabetes mellitus without complication ([place]) 123456  . Labyrinthitis of right ear   . Chronic neck pain   . Uncontrollable vomiting   . Diabetes ([place]) 06/30/2013    Patient's Medications  New Prescriptions   DOXYCYCLINE (VIBRA-TABS) 100 MG TABLET    Take 1 tablet (100 mg total) by mouth 2 (two) times daily.  Previous Medications   AMLODIPINE (NORVASC) 5 MG TABLET    Take 5 mg by mouth daily.   AMLODIPINE (NORVASC) 5 MG TABLET    Take 1 tablet (5 mg total) by mouth daily.   HYDROCODONE-ACETAMINOPHEN (NORCO/VICODIN) 5-325 MG TABLET    Take 1 tablet by mouth every 6 (six) hours as needed for moderate pain. Reported on 10/13/2015   HYDROCODONE-ACETAMINOPHEN (NORCO/VICODIN) 5-325 MG TABLET    Take 1-2 tablets by mouth every 4 (four) hours as needed for moderate pain.   IBUPROFEN (ADVIL,MOTRIN) 200 MG TABLET    Take 200 mg by mouth every 6 (six) hours as needed for moderate pain. Reported on 10/13/2015   INSULIN GLARGINE (LANTUS) 100 UNIT/ML INJECTION    Inject 0.12 mLs (12 Units total) into the skin at bedtime.   INSULIN SYRINGE-NEEDLE U-100 (INSULIN SYRINGE 1CC/30GX5/16") 30G X 5/16" 1 ML MISC    Check blood sugar TID & QHS   MECLIZINE (ANTIVERT) 25 MG TABLET    Take 1 tablet (25 mg total) by mouth 3 (three) times daily as needed for dizziness.   METFORMIN (GLUCOPHAGE) 500 MG TABLET    Take 2 tablets (1,000 mg total) by mouth 2 (two) times daily with a meal.   PROMETHAZINE (PHENERGAN) 12.5 MG TABLET    Take 1 tablet (12.5 mg total) by mouth every 8 (eight) hours as needed for nausea or vomiting.   TRAMADOL (ULTRAM) 50 MG TABLET    Take by mouth every 6 (six) hours as needed. Reported on 10/13/2015   TRAMADOL (ULTRAM) 50 MG TABLET    Take 1 tablet (50 mg total) by mouth every 8 (eight) hours as needed.  Modified Medications   No medications on file  Discontinued Medications   VANCOMYCIN (VANCOCIN) 750 MG/150ML SOLN    Inject 150 mLs (750 mg total) into the vein every 12 (twelve) hours.    Subjective: Emily Hoover ( she is listed as Emily Hoover on some of her hospital records and Emily Hoover is the last name listed on her social security card)is in for her hospital followup visit. She is accompanied by her grandson. She is a 70 y.o. female with history of diabetes mellitus and hypertension.She is Spanish-speaking and HPI was obtained with assistance of the language interpreter phone. She was initially evaluated in the ED on 09/13/15 for her left hip pain where MRI showed unusual appearance of abnormal muscular edema involving the iliopsoas, left hip adductor musculature, and proximal left vastus musculature, but without underlying bony abnormality. There was no evidence of abscess and she was discharged home.  She came back to the ED on 3/10 with worsening groin and hip pain being unable to ambulate. She also began to have worsening hematuria, fatigue, and dysuria. She was admitted to the hospital. CT renal stone study this admission showed urinary bladder thickening with no urolithiasis or hydronephrosis. This study also showed left iliopsoas and hip adductor muscle edema with no developing fluid collections or soft tissue air. Orthopedics evaluated patient and felt symptoms were most likely due to myositis with no signs of a septic hip clinically or by imaging. A urine culture from initial ED visit grew MRSA and repeat culture this admission with multiple  species. one of 2 blood cultures drawn on 311 grew MRSA. Repeat blood cultures were negative.She had a transthoracic echocardiogram on 09/19/2015 that did not show any vegetations.she is now completed 26 days of IV vancomycin therapy. She's had no problems tolerating her PICC. Last week she did develop supratherapeutic vancomycin trough levels with a level of 51.8 mcg per mL. Her vancomycin was held. Her creatinine increased up to 1.69 on 10/05/2015. Her vancomycin level slowly has drifted down and her creatinine 2 days ago had come back down to 1.22. She is feeling much better. She has not required any pain medication since discharge and she is up walking without assistance. She has only slight residual discomfort in her left groin. She has not had any fever, chills or sweats.  Review of Systems: Review of Systems  Constitutional: Negative for fever, chills, weight loss, malaise/fatigue and diaphoresis.  HENT: Negative for sore throat.   Respiratory: Negative for cough, sputum production and shortness of breath.   Cardiovascular: Negative for chest pain.  Gastrointestinal: Negative for nausea, vomiting, abdominal pain and diarrhea.  Genitourinary: Negative for dysuria.  Musculoskeletal: Negative for myalgias and joint pain.       Mild left groin pain.  Skin: Negative for rash.  Neurological: Negative for headaches.    Past Medical History  Diagnosis Date  . Diabetes mellitus without complication ([place])   . Hypercholesteremia   . Diabetes mellitus   . Hypertension     Social History  Substance Use Topics  . Smoking status: Never Smoker   . Smokeless tobacco: None  . Alcohol Use: No    Family History  Problem Relation Age of Onset  . Family history unknown: Yes  . CAD Mother   . CAD Father   . Hypertension Father   . Diabetes Neg Hx   . Cancer Neg Hx     No Known Allergies  Objective: Filed Vitals:   10/13/15 1440  BP: 177/84  Pulse: 72  Temp: 97.7 F (36.5 C)    TempSrc: Oral  Weight: 92 lb (41.731 kg)   Body mass index is 19.9 kg/(m^2).  Physical Exam  Constitutional: She is oriented to person, place, and time.  She is in good spirits. She became tearful at the end of the exam when she was talking about how much better she is feeling and how thankful she is.  Cardiovascular: Normal rate and regular rhythm.   No murmur heard. Pulmonary/Chest: Breath sounds normal.  Abdominal: Soft. She exhibits no mass. There is no tenderness.  Musculoskeletal: Normal range of motion. She exhibits no edema or tenderness.  Mild discomfort with palpation in her left groin and proximal thigh. No cellulitis or mass noted.  Neurological: She is alert and oriented to person, place, and time. Gait normal.  Skin: No rash noted.  PICC site looks good.  Psychiatric: Mood and  affect normal.    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 She had transient vancomycin toxicity with acute renal insufficiency but this is resolving after vancomycin was held. I will stop her vancomycin now and switch her to oral doxycycline for 2 more weeks. Her PICC will be pulled. I will monitor her lab work and repeat her inflammatory markers. She will followup in 4-6 weeks.      Relevant Medications   doxycycline (VIBRA-TABS) 100 MG tablet       Michel Bickers, MD [facility] for Infectious [place] [phone] pager   [phone] cell 10/14/2015, 11:36 AM

## 2015-10-18 ENCOUNTER — Ambulatory Visit: Payer: Self-pay | Admitting: Pharmacist

## 2015-10-28 ENCOUNTER — Encounter: Payer: Self-pay | Admitting: Internal Medicine

## 2015-10-31 ENCOUNTER — Encounter: Payer: Self-pay | Admitting: Internal Medicine

## 2015-10-31 ENCOUNTER — Ambulatory Visit: Payer: Self-pay | Attending: Internal Medicine | Admitting: Internal Medicine

## 2015-10-31 VITALS — BP 137/78 | HR 79 | Temp 97.5°F | Resp 18 | Ht <= 58 in | Wt 93.2 lb

## 2015-10-31 DIAGNOSIS — M609 Myositis, unspecified: Secondary | ICD-10-CM

## 2015-10-31 DIAGNOSIS — I10 Essential (primary) hypertension: Secondary | ICD-10-CM

## 2015-10-31 DIAGNOSIS — E119 Type 2 diabetes mellitus without complications: Secondary | ICD-10-CM

## 2015-10-31 LAB — GLUCOSE, POCT (MANUAL RESULT ENTRY): POC Glucose: 229 mg/dl — AB (ref 70–99)

## 2015-10-31 MED ORDER — DOXYCYCLINE HYCLATE 100 MG PO TABS: 100.0000 mg | ORAL_TABLET | Freq: Two times a day (BID) | ORAL | Status: DC

## 2015-10-31 MED FILL — ?DOXYCYCLINE 100 MG TABLET: 100 | 14 days supply | Qty: 28 | Fill #0

## 2015-10-31 NOTE — Progress Notes (Signed)
Patient is here for FU DM  Patient denies pain at this time.  Patient has taken BP medication today and patient has eaten.  Patients family member states patients blood sugars fell in range while on metformin. Patient now has sugar levels over 200 since metformin was removed.

## 2015-10-31 NOTE — Progress Notes (Signed)
Emily Hoover, is a 70 y.o. female  [id number]  [id number]  DOB - 1945/09/16  Chief Complaint  Patient presents with  . Follow-up    HTN + DM        Subjective:   Emily Hoover is a 70 y.o. female here today for a follow up visit.  Pt has hx of MRSA bacteremia, was recently switched from Vancomycin to 2 wks doxycycline by ID Dr. Megan Salon on 10/14/15.  However, pt never picked up Doxycycline and thus never started.   +DM, ooc. Was on metformin for time but stopped due to aki on vancomycin, currently on lantus.  Nephew in clinic with her today to help translate.  Pt's sugars much better controlled when on metformin (but had to stop recently due to AKI), she does not really pay attention to her carb intake as she should.  Otherwise, she feels great and eating well again.  Patient has No headache, No chest pain, No abdominal pain - No Nausea, No new weakness tingling or numbness, No Cough - SOB.  No problems updated.  ALLERGIES: No Known Allergies  PAST MEDICAL HISTORY: Past Medical History  Diagnosis Date  . Diabetes mellitus without complication ([place])   . Hypercholesteremia   . Diabetes mellitus   . Hypertension     MEDICATIONS AT HOME: Prior to Admission medications   Medication Sig Start Date End Date Taking? Authorizing Provider  amLODipine (NORVASC) 5 MG tablet Take 1 tablet (5 mg total) by mouth daily. 09/27/15   Maren Reamer, MD  doxycycline (VIBRA-TABS) 100 MG tablet Take 1 tablet (100 mg total) by mouth 2 (two) times daily. 10/31/15   Maren Reamer, MD  HYDROcodone-acetaminophen (NORCO/VICODIN) 5-325 MG tablet Take 1 tablet by mouth every 6 (six) hours as needed for moderate pain. Reported on 10/13/2015    Historical Provider, MD  ibuprofen (ADVIL,MOTRIN) 200 MG tablet Take 200 mg by mouth every 6 (six) hours as needed for moderate pain. Reported on 10/13/2015    Historical Provider, MD  insulin glargine (LANTUS) 100 UNIT/ML injection Inject 0.12 mLs (12 Units  total) into the skin at bedtime. 10/10/15   Maren Reamer, MD  Insulin Syringe-Needle U-100 (INSULIN SYRINGE 1CC/30GX5/16") 30G X 5/16" 1 ML MISC Check blood sugar TID & QHS 10/10/15   Maren Reamer, MD  meclizine (ANTIVERT) 25 MG tablet Take 1 tablet (25 mg total) by mouth 3 (three) times daily as needed for dizziness. 09/07/15   Ripley Fraise, MD  metFORMIN (GLUCOPHAGE) 500 MG tablet Take 2 tablets (1,000 mg total) by mouth 2 (two) times daily with a meal. 09/09/15   Velvet Bathe, MD  traMADol (ULTRAM) 50 MG tablet Take by mouth every 6 (six) hours as needed. Reported on 10/13/2015    Historical Provider, MD     Objective:   Filed Vitals:   10/31/15 1651  BP: 137/78  Pulse: 79  Temp: 97.5 F (36.4 C)  TempSrc: Oral  Resp: 18  Height: 4\' 9"  (1.448 m)  Weight: 93 lb 3.2 oz (42.275 kg)  SpO2: 98%    Exam General appearance : Awake, alert, not in any distress. Speech Clear. Not toxic looking HEENT: Atraumatic and Normocephalic, pupils equally reactive to light. Neck: supple, no JVD. No cervical lymphadenopathy.  Chest:Good air entry bilaterally, no added sounds. CVS: S1 S2 regular, no murmurs/gallups or rubs. Abdomen: Bowel sounds active, Non tender and not distended with no gaurding, rigidity or rebound. Extremities: B/L Lower Ext shows no edema, both legs  are warm to touch Neurology: Awake alert, and oriented X 3, CN II-XII grossly intact, Non focal Skin:No Rash  Data Review Lab Results  Component Value Date   HGBA1C 9.8 10/10/2015   HGBA1C 11.7 09/27/2015   HGBA1C 13.0* 09/20/2015     Assessment & Plan   1. Essential hypertension Controlled  2. DM type 2, not at goal [facility]) - ada diet again reemphasized, - will rechk bmp, if better will resume metformin, continue lantus 14 qhs. - BASIC METABOLIC PANEL WITH GFR - glu 229 today  3. Myositis /mrsa bacteremia - unfortunately never started the Doxy that was suppose to be started on 10/14/15. Will reorder now. -  doxycycline (VIBRA-TABS) 100 MG tablet; Take 1 tablet (100 mg total) by mouth 2 (two) times daily.  Dispense: 28 tablet; Refill: 0     Patient have been counseled extensively about nutrition and exercise  Return in about 4 weeks (around 11/28/2015).  The patient was given clear instructions to go to ER or return to medical center if symptoms don't improve, worsen or new problems develop. The patient verbalized understanding. The patient was told to call to get lab results if they haven't heard anything in the next week.    Maren Reamer, MD, [facility] [place], [place]   10/31/2015, 4:55 PM

## 2015-10-31 NOTE — Patient Instructions (Signed)
La diabetes mellitus y los alimentos (Diabetes Mellitus and Food) Es importante que controle su nivel de azcar en la sangre (glucosa). El nivel de glucosa en sangre depende en gran medida de lo que usted come. Comer alimentos saludables en las cantidades [place] a lo largo del Training and development officer, aproximadamente a la misma hora [employer], lo ayudar a Chief Technology Officer su nivel de Multimedia programmer. Tambin puede ayudarlo a retrasar o Patent attorney de la diabetes mellitus. Comer de [employer] saludable incluso puede ayudarlo a Chartered loss adjuster de presin arterial y a Science writer o Theatre manager un peso saludable.  Entre las recomendaciones generales para alimentarse y Audiological scientist los alimentos de forma saludable, se incluyen las siguientes:  Respetar las comidas principales y comer colaciones con regularidad. Evitar pasar largos perodos sin comer con el fin de perder peso.  Seguir una dieta que consista principalmente en alimentos de origen vegetal, como frutas, vegetales, frutos secos, legumbres y cereales integrales.  Utilizar mtodos de coccin a baja temperatura, como hornear, en lugar de mtodos de coccin a alta temperatura, como frer en abundante aceite. Trabaje con el nutricionista para aprender a Financial planner nutricional de las etiquetas de los alimentos. CMO PUEDEN AFECTARME LOS ALIMENTOS? Carbohidratos Los carbohidratos afectan el nivel de glucosa en sangre ms que cualquier otro tipo de alimento. El nutricionista lo ayudar a Teacher, adult education cuntos carbohidratos puede consumir en cada comida y ensearle a contarlos. El recuento de carbohidratos es importante para mantener la glucosa en sangre en un nivel saludable, en especial si utiliza insulina o toma determinados medicamentos para la diabetes mellitus. Alcohol El alcohol puede provocar disminuciones sbitas de la glucosa en sangre (hipoglucemia), en especial si utiliza insulina o toma determinados medicamentos para la diabetes mellitus. La  hipoglucemia es una afeccin que puede poner en peligro la vida. Los sntomas de la hipoglucemia (somnolencia, mareos y Data processing manager) son similares a los sntomas de haber consumido mucho alcohol.  Si el mdico lo autoriza a beber alcohol, hgalo con moderacin y siga estas pautas:  Las mujeres no deben beber ms de un trago por da, y los hombres no deben beber ms de dos tragos por Training and development officer. Un trago es igual a:  12 onzas (355 ml) de cerveza  5 onzas de vino (150 ml) de vino  1,5onzas (23m) de bebidas espirituosas  No beba con el estmago vaco.  Mantngase hidratado. Beba agua, gaseosas dietticas o t helado sin azcar.  Las gaseosas comunes, los jugos y otros refrescos podran contener muchos carbohidratos y se dCivil Service fast streamer QU ALIMENTOS NO SE RECOMIENDAN? Cuando haga las elecciones de alimentos, es importante que recuerde que todos los alimentos son distintos. Algunos tienen menos nutrientes que otros por porcin, aunque podran tener la misma cantidad de caloras o carbohidratos. Es difcil darle al cuerpo lo que necesita cuando consume alimentos con menos nutrientes. Estos son algunos ejemplos de alimentos que debera evitar ya que contienen muchas caloras y carbohidratos, pero pocos nutrientes:  GPhysicist, medicaltrans (la mayora de los alimentos procesados incluyen grasas trans en la etiqueta de Informacin nutricional).  Gaseosas comunes.  Jugos.  Caramelos.  Dulces, como tortas, pasteles, rosquillas y [place]  Comidas fritas. QU ALIMENTOS PUEDO COMER? Consuma alimentos ricos en nutrientes, que nutrirn el cuerpo y lo mantendrn saludable. Los alimentos que debe comer tambin dependern de varios factores, como:  Las caloras que necesita.  Los medicamentos que toma.  Su peso.  El nivel de glucosa en sMarist College  El nArrow Rockde presin arterial.  El nivel de colesterol.  Debe consumir una amplia variedad de alimentos, por ejemplo:  Protenas.  Cortes de carne  magros.  Protenas con bajo contenido de grasas saturadas, como pescado, clara de huevo y frijoles. Evite las carnes procesadas.  Frutas y vegetales.  Frutas y vegetales que pueden ayudar a controlar los niveles sanguneos de glucosa, como manzanas, mangos y batatas.  Productos lcteos.  Elija productos lcteos sin grasa o con bajo contenido de grasa, como leche, yogur y queso.  Cereales, panes, pastas y arroz.  Elija cereales integrales, como panes multicereales, avena en grano y arroz integral. Estos alimentos pueden ayudar a controlar la presin arterial.  Grasas.  Alimentos que contengan grasas saludables, como frutos secos, aguacate, aceite de oliva, aceite de canola y pescado. TODOS LOS QUE PADECEN DIABETES MELLITUS TIENEN EL MISMO PLAN DE COMIDAS? Dado que todas las personas que padecen diabetes mellitus son distintas, no hay un solo plan de comidas que funcione para todos. Es muy importante que se rena con un nutricionista que lo ayudar a crear un plan de comidas adecuado para usted.   Esta informacin no tiene como fin reemplazar el consejo del mdico. Asegrese de hacerle al mdico cualquier pregunta que tenga.   Document Released: 10/02/2007 Document Revised: 07/16/2014 Elsevier Interactive Patient Education 2016 Elsevier Inc.  

## 2015-11-01 ENCOUNTER — Other Ambulatory Visit: Payer: Self-pay | Admitting: Internal Medicine

## 2015-11-01 LAB — BASIC METABOLIC PANEL WITH GFR
BUN: 19 mg/dL (ref 7–25)
CALCIUM: 9.5 mg/dL (ref 8.6–10.4)
CO2: 27 mmol/L (ref 20–31)
Chloride: 99 mmol/L (ref 98–110)
Creat: 0.79 mg/dL (ref 0.60–0.93)
GFR, EST AFRICAN AMERICAN: 87 mL/min (ref >=60)
GFR, EST NON AFRICAN AMERICAN: 76 mL/min (ref >=60)
Glucose, Bld: 220 mg/dL — ABNORMAL HIGH (ref 65–99)
POTASSIUM: 3.2 mmol/L — AB (ref 3.5–5.3)
Sodium: 136 mmol/L (ref 135–146)

## 2015-11-01 MED ORDER — METFORMIN HCL 500 MG PO TABS: 1000.0000 mg | ORAL_TABLET | Freq: Two times a day (BID) | ORAL | Status: DC

## 2015-11-14 MED FILL — LANTUS 100 UNITS/ML VIAL: 100 | 90 days supply | Qty: 10 | Fill #1

## 2015-11-16 ENCOUNTER — Telehealth: Payer: Self-pay | Admitting: *Deleted

## 2015-11-16 NOTE — Telephone Encounter (Signed)
-----   Message from Maren Reamer, MD sent at 11/01/2015  9:25 AM EDT ----- Please call pt and tell pt her kidney function stable.  Resume metformin 1000mg  po bid.  I renewed prescription but they also have plenty of pills at home. Thanks.

## 2015-11-16 NOTE — Telephone Encounter (Signed)
Medical Assistant used [employer] to contact patient.  Interpreter Name: Berle Mull #: [id number]  Patient verified DOB Patient is aware of kidney function being stable. Patient advised to continue metformin 1000 mg BID. Patient expressed her understanding and had no further questions at this time.

## 2015-12-01 ENCOUNTER — Telehealth: Payer: Self-pay | Admitting: *Deleted

## 2015-12-01 ENCOUNTER — Ambulatory Visit: Payer: Self-pay | Admitting: Internal Medicine

## 2015-12-01 NOTE — Telephone Encounter (Signed)
RN spoke with the pt's son.  A new appointment with Dr. Megan Salon was made for 01/03/16.

## 2016-01-03 ENCOUNTER — Ambulatory Visit: Payer: Self-pay | Admitting: Internal Medicine

## 2016-02-01 ENCOUNTER — Encounter (HOSPITAL_COMMUNITY): Payer: Self-pay | Admitting: Emergency Medicine

## 2016-02-01 ENCOUNTER — Emergency Department (HOSPITAL_COMMUNITY)
Admission: EM | Admit: 2016-02-01 | Discharge: 2016-02-02 | Disposition: A | Discharge status: Home / Self Care | Payer: Self-pay | Attending: Emergency Medicine | Admitting: Emergency Medicine

## 2016-02-01 DIAGNOSIS — Z79899 Other long term (current) drug therapy: Secondary | ICD-10-CM | POA: Insufficient documentation

## 2016-02-01 DIAGNOSIS — Z7984 Long term (current) use of oral hypoglycemic drugs: Secondary | ICD-10-CM | POA: Insufficient documentation

## 2016-02-01 DIAGNOSIS — N390 Urinary tract infection, site not specified: Secondary | ICD-10-CM | POA: Insufficient documentation

## 2016-02-01 DIAGNOSIS — I10 Essential (primary) hypertension: Secondary | ICD-10-CM | POA: Insufficient documentation

## 2016-02-01 DIAGNOSIS — Z794 Long term (current) use of insulin: Secondary | ICD-10-CM | POA: Insufficient documentation

## 2016-02-01 DIAGNOSIS — E119 Type 2 diabetes mellitus without complications: Secondary | ICD-10-CM | POA: Insufficient documentation

## 2016-02-01 HISTORY — DX: Disorder of kidney and ureter, unspecified: N28.9

## 2016-02-01 LAB — URINE MICROSCOPIC-ADD ON

## 2016-02-01 LAB — URINALYSIS, ROUTINE W REFLEX MICROSCOPIC
BILIRUBIN URINE: NEGATIVE
GLUCOSE, UA: NEGATIVE mg/dL
KETONES UR: NEGATIVE mg/dL
NITRITE: NEGATIVE
PH: 8 (ref 5.0–8.0)
Protein, ur: 100 mg/dL — AB
Specific Gravity, Urine: 1.014 (ref 1.005–1.030)

## 2016-02-01 LAB — COMPREHENSIVE METABOLIC PANEL
ALK PHOS: 64 U/L (ref 38–126)
ALT: 12 U/L — AB (ref 14–54)
AST: 17 U/L (ref 15–41)
Albumin: 3.1 g/dL — ABNORMAL LOW (ref 3.5–5.0)
Anion gap: 11 (ref 5–15)
BILIRUBIN TOTAL: 2.1 mg/dL — AB (ref 0.3–1.2)
BUN: 18 mg/dL (ref 6–20)
CALCIUM: 9.2 mg/dL (ref 8.9–10.3)
CO2: 24 mmol/L (ref 22–32)
CREATININE: 0.73 mg/dL (ref 0.44–1.00)
Chloride: 99 mmol/L — ABNORMAL LOW (ref 101–111)
GFR calc non Af Amer: >60 mL/min (ref >60)
Glucose, Bld: 133 mg/dL — ABNORMAL HIGH (ref 65–99)
Potassium: 3.2 mmol/L — ABNORMAL LOW (ref 3.5–5.1)
SODIUM: 134 mmol/L — AB (ref 135–145)
Total Protein: 6.9 g/dL (ref 6.5–8.1)

## 2016-02-01 LAB — CBC WITH DIFFERENTIAL/PLATELET
Basophils Absolute: 0 10*3/uL (ref 0.0–0.1)
Basophils Relative: 0 %
Eosinophils Absolute: 0 10*3/uL (ref 0.0–0.7)
Eosinophils Relative: 0 %
HEMATOCRIT: 35.1 % — AB (ref 36.0–46.0)
HEMOGLOBIN: 11.9 g/dL — AB (ref 12.0–15.0)
LYMPHS ABS: 0.8 10*3/uL (ref 0.7–4.0)
LYMPHS PCT: 14 %
MCH: 29.5 pg (ref 26.0–34.0)
MCHC: 33.9 g/dL (ref 30.0–36.0)
MCV: 86.9 fL (ref 78.0–100.0)
Monocytes Absolute: 0.4 10*3/uL (ref 0.1–1.0)
Monocytes Relative: 8 %
NEUTROS ABS: 4.5 10*3/uL (ref 1.7–7.7)
NEUTROS PCT: 78 %
Platelets: 251 10*3/uL (ref 150–400)
RBC: 4.04 MIL/uL (ref 3.87–5.11)
RDW: 12.7 % (ref 11.5–15.5)
WBC: 5.7 10*3/uL (ref 4.0–10.5)

## 2016-02-01 LAB — CBG MONITORING, ED
GLUCOSE-CAPILLARY: 125 mg/dL — AB (ref 65–99)
Glucose-Capillary: 130 mg/dL — ABNORMAL HIGH (ref 65–99)

## 2016-02-01 MED ORDER — SODIUM CHLORIDE 0.9 % IV BOLUS (SEPSIS)
1000.0000 mL | Freq: Once | INTRAVENOUS | Status: AC
  Administered 2016-02-02: 1000 mL via INTRAVENOUS

## 2016-02-01 MED ORDER — DEXTROSE 5 % IV SOLN
1.0000 g | Freq: Once | INTRAVENOUS | Status: AC
  Administered 2016-02-02: 1 g via INTRAVENOUS
  Filled 2016-02-01: qty 10

## 2016-02-01 MED ORDER — ACETAMINOPHEN 500 MG PO TABS
500.0000 mg | ORAL_TABLET | Freq: Once | ORAL | Status: AC
  Administered 2016-02-02: 500 mg via ORAL
  Filled 2016-02-01: qty 1

## 2016-02-01 NOTE — ED Triage Notes (Signed)
Patient presents with multiple complaints : generalized weakness , fatigue , poor appetite , low back pain and headache onset this week , denies fever or chills.

## 2016-02-01 NOTE — ED Provider Notes (Signed)
[facility] DEPT Provider Note   CSN: [id number] Arrival date & time: 02/01/16  2151  First Provider Contact:  11:19 PM   By signing my name below, I, Reola Mosher, attest that this documentation has been prepared under the direction and in the presence of Merryl Hacker, MD. Electronically Signed: Reola Mosher, ED Scribe. 02/01/16. 11:26 PM.  History   Chief Complaint Chief Complaint  Patient presents with  . Fatigue  . Back Pain  . Weakness  . Anorexia   The history is provided by the patient. A language interpreter was used Surveyor, quantity, nurse at bedside).   HPI Comments: Emily Hoover is a 70 y.o. female with a PMHx of DM, HLD, HTN, and renal insufficiency, who presents to the Emergency Department with multiple complaints. Pt reports that she is experiecing bilateral back pain, generalized weakness,  HA, and loss of appetite x 1 day. She rates her pain as 7/10. She also reports that she was having abdominal pain, but is has since resolved while in the ED. No OTC medications or home remedies tried PTA. No hx of similar sx. Pt is only taking DM medications daily. Denies fevers, nausea, vomiting, chest pain, dysuria, or any other symptoms. Does endorse chills.  Past Medical History:  Diagnosis Date  . Diabetes mellitus   . Diabetes mellitus without complication ([place])   . Hypercholesteremia   . Hypertension   . Renal insufficiency     Patient Active Problem List   Diagnosis Date Noted  . 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 Renal insufficiency 10/10/2015  . Staphylococcus aureus bacteremia 09/19/2015  . UTI (lower urinary tract infection) 09/16/2015  . Hip pain 09/16/2015  . Hypertension 09/16/2015  . Pain management 09/16/2015  . Vertigo 09/07/2015  . Labyrinthitis 09/07/2015  . Neck pain 09/07/2015  . Diabetes mellitus without complication ([place]) 123456  . Labyrinthitis of right ear   . Chronic neck pain   . Uncontrollable vomiting   . Diabetes ([place])  06/30/2013    History reviewed. No pertinent surgical history.  OB History    Gravida Para Term Preterm AB Living   0 0 0 0 0     SAB TAB Ectopic Multiple Live Births   0 0 0         Home Medications    Prior to Admission medications   Medication Sig Start Date End Date Taking? Authorizing Provider  amLODipine (NORVASC) 5 MG tablet Take 1 tablet (5 mg total) by mouth daily. 09/27/15   Maren Reamer, MD  doxycycline (VIBRA-TABS) 100 MG tablet Take 1 tablet (100 mg total) by mouth 2 (two) times daily. 10/31/15   Maren Reamer, MD  HYDROcodone-acetaminophen (NORCO/VICODIN) 5-325 MG tablet Take 1 tablet by mouth every 6 (six) hours as needed for moderate pain. Reported on 10/13/2015    Historical Provider, MD  ibuprofen (ADVIL,MOTRIN) 200 MG tablet Take 200 mg by mouth every 6 (six) hours as needed for moderate pain. Reported on 10/13/2015    Historical Provider, MD  insulin glargine (LANTUS) 100 UNIT/ML injection Inject 0.12 mLs (12 Units total) into the skin at bedtime. 10/10/15   Maren Reamer, MD  Insulin Syringe-Needle U-100 (INSULIN SYRINGE 1CC/30GX5/16") 30G X 5/16" 1 ML MISC Check blood sugar TID & QHS 10/10/15   Maren Reamer, MD  meclizine (ANTIVERT) 25 MG tablet Take 1 tablet (25 mg total) by mouth 3 (three) times daily as needed for dizziness. 09/07/15   Ripley Fraise, MD  metFORMIN (GLUCOPHAGE) 500 MG  tablet Take 2 tablets (1,000 mg total) by mouth 2 (two) times daily with a meal. 11/01/15   Maren Reamer, MD  traMADol (ULTRAM) 50 MG tablet Take by mouth every 6 (six) hours as needed. Reported on 10/13/2015    Historical Provider, MD    Family History Family History  Problem Relation Age of Onset  . Family history unknown: Yes  . CAD Mother   . CAD Father   . Hypertension Father   . Diabetes Neg Hx   . Cancer Neg Hx     Social History Social History  Substance Use Topics  . Smoking status: Never Smoker  . Smokeless tobacco: Not on file  . Alcohol use No      Allergies   Review of patient's allergies indicates no known allergies.   Review of Systems Review of Systems  Constitutional: Positive for appetite change (loss). Negative for fever.  Cardiovascular: Negative for chest pain.  Gastrointestinal: Positive for abdominal pain. Negative for nausea and vomiting.  Genitourinary: Negative for dysuria.  Musculoskeletal: Positive for arthralgias (bilateral hips).  Neurological: Positive for weakness and headaches.  All other systems reviewed and are negative.   Physical Exam Updated Vital Signs BP 141/73   Pulse 92   Temp 98.3 F (36.8 C) (Oral)   Resp 20   Ht 5' (1.524 m)   Wt 98 lb 6.4 oz (44.6 kg)   SpO2 92%   BMI 19.22 kg/m   Physical Exam  Constitutional: She is oriented to person, place, and time. No distress.  Elderly  HENT:  Head: Normocephalic and atraumatic.  Mucous membranes moist  Eyes: Pupils are equal, round, and reactive to light.  Cardiovascular: Normal rate, regular rhythm and normal heart sounds.   No murmur heard. Pulmonary/Chest: Effort normal and breath sounds normal. No respiratory distress. She has no wheezes.  Abdominal: Soft. Bowel sounds are normal. She exhibits no mass. There is no tenderness. There is no guarding.  Genitourinary:  Genitourinary Comments: No CVA tenderness  Neurological: She is alert and oriented to person, place, and time.  Skin: Skin is warm and dry.  Psychiatric: She has a normal mood and affect.  Nursing note and vitals reviewed.   ED Treatments / Results  DIAGNOSTIC STUDIES: Oxygen Saturation is 97% on RA, normal by my interpretation.   COORDINATION OF CARE: 11:25 PM-Discussed next steps with pt. Pt verbalized understanding and is agreeable with the plan.   Labs (all labs ordered are listed, but only abnormal results are displayed) Labs Reviewed  CBC WITH DIFFERENTIAL/PLATELET - Abnormal; Notable for the following:       Result Value   Hemoglobin 11.9 (*)     HCT 35.1 (*)    All other components within normal limits  COMPREHENSIVE METABOLIC PANEL - Abnormal; Notable for the following:    Sodium 134 (*)    Potassium 3.2 (*)    Chloride 99 (*)    Glucose, Bld 133 (*)    Albumin 3.1 (*)    ALT 12 (*)    Total Bilirubin 2.1 (*)    All other components within normal limits  URINALYSIS, ROUTINE W REFLEX MICROSCOPIC (NOT AT [facility]) - Abnormal; Notable for the following:    APPearance TURBID (*)    Hgb urine dipstick SMALL (*)    Protein, ur 100 (*)    Leukocytes, UA LARGE (*)    All other components within normal limits  URINE MICROSCOPIC-ADD ON - Abnormal; Notable for the following:  Squamous Epithelial / LPF 0-5 (*)    Bacteria, UA MANY (*)    Crystals TRIPLE PHOSPHATE CRYSTALS (*)    All other components within normal limits  CBG MONITORING, ED - Abnormal; Notable for the following:    Glucose-Capillary 130 (*)    All other components within normal limits  CBG MONITORING, ED - Abnormal; Notable for the following:    Glucose-Capillary 125 (*)    All other components within normal limits  URINE CULTURE    EKG  EKG Interpretation None       Radiology No results found.  Procedures Procedures (including critical care time)  Medications Ordered in ED Medications  sodium chloride 0.9 % bolus 1,000 mL (1,000 mLs Intravenous New Bag/Given 02/02/16 0011)  cefTRIAXone (ROCEPHIN) 1 g in dextrose 5 % 50 mL IVPB (0 g Intravenous Stopped 02/02/16 0108)  acetaminophen (TYLENOL) tablet 500 mg (500 mg Oral Given 02/02/16 0039)     Initial Impression / Assessment and Plan / ED Course  I have reviewed the triage vital signs and the nursing notes.  Pertinent labs & imaging results that were available during my care of the patient were reviewed by me and considered in my medical decision making (see chart for details).  Clinical Course   Patient presents with multiple complaints. She is nontoxic on exam. Vital signs are reassuring. Afebrile.  Lab work sent from triage reviewed. Patient appears to have urinary tract infection. She denies dysuria. Urine culture sent. Given her age, feel UTI could explain her generalized complaints. She is nontoxic. Afebrile. No signs of pyelonephritis. Patient was given fluids and IV Rocephin. No leukocytosis. Lab work is otherwise reassuring. On recheck, patient states that she feels better.  She was able to ambulate independently in the hallway. Will discharge home with a course of Keflex.   Final Clinical Impressions(s) / ED Diagnoses   Final diagnoses:  UTI (lower urinary tract infection)    New Prescriptions New Prescriptions   No medications on file   I personally performed the services described in this documentation, which was scribed in my presence. The recorded information has been reviewed and is accurate.     Merryl Hacker, MD 02/02/16 [phone]

## 2016-02-02 MED ORDER — CEPHALEXIN 500 MG PO CAPS: 500.0000 mg | ORAL_CAPSULE | Freq: Three times a day (TID) | ORAL | 0 refills | Status: DC

## 2016-02-02 NOTE — ED Notes (Signed)
Patient ambulate in the hall with no complaints nor problems

## 2016-02-04 LAB — URINE CULTURE

## 2016-02-05 ENCOUNTER — Telehealth (HOSPITAL_BASED_OUTPATIENT_CLINIC_OR_DEPARTMENT_OTHER): Payer: Self-pay

## 2016-02-05 NOTE — Telephone Encounter (Signed)
Post ED Visit - Positive Culture Follow-up  Culture report reviewed by antimicrobial stewardship pharmacist:  []  Elenor Quinones, Pharm.D. []  Heide Guile, Pharm.D., BCPS []  Parks Neptune, Pharm.D. []  Alycia Rossetti, Pharm.D., BCPS []  [place], Pharm.D., BCPS, AAHIVP []  Legrand [place]o, Pharm.D., BCPS, AAHIVP []  Milus Glazier, Pharm.D. []  Rob [place], Pharm.D. Ebony Hail Masters Pharm D Positive urine culture Treated with Cephalexin, organism sensitive to the same and no further patient follow-up is required at this time.  Kafi Dotter, Carolynn Comment 02/05/2016, 12:00 PM

## 2016-02-21 ENCOUNTER — Telehealth: Payer: Self-pay | Admitting: Internal Medicine

## 2016-02-21 MED ORDER — INSULIN GLARGINE 100 UNIT/ML ~~LOC~~ SOLN: 12.0000 [IU] | Freq: Every day | SUBCUTANEOUS | 0 refills | Status: DC

## 2016-02-21 MED FILL — LANTUS 100 UNITS/ML VIAL: 100 | 28 days supply | Qty: 10 | Fill #2

## 2016-02-21 NOTE — Telephone Encounter (Signed)
Insulin refilled - patient needs office visit with Dr. Janne Napoleon.

## 2016-02-21 NOTE — Telephone Encounter (Signed)
Patient is needing insulin

## 2016-07-31 ENCOUNTER — Emergency Department (HOSPITAL_COMMUNITY): Payer: Self-pay

## 2016-07-31 ENCOUNTER — Emergency Department (HOSPITAL_COMMUNITY)
Admission: EM | Admit: 2016-07-31 | Discharge: 2016-07-31 | Disposition: A | Discharge status: Home / Self Care | Payer: Self-pay | Attending: Emergency Medicine | Admitting: Emergency Medicine

## 2016-07-31 ENCOUNTER — Encounter (HOSPITAL_COMMUNITY): Payer: Self-pay | Admitting: Emergency Medicine

## 2016-07-31 DIAGNOSIS — Z79899 Other long term (current) drug therapy: Secondary | ICD-10-CM | POA: Insufficient documentation

## 2016-07-31 DIAGNOSIS — I10 Essential (primary) hypertension: Secondary | ICD-10-CM | POA: Insufficient documentation

## 2016-07-31 DIAGNOSIS — J019 Acute sinusitis, unspecified: Secondary | ICD-10-CM | POA: Insufficient documentation

## 2016-07-31 DIAGNOSIS — E119 Type 2 diabetes mellitus without complications: Secondary | ICD-10-CM | POA: Insufficient documentation

## 2016-07-31 DIAGNOSIS — Z794 Long term (current) use of insulin: Secondary | ICD-10-CM | POA: Insufficient documentation

## 2016-07-31 LAB — RAPID STREP SCREEN (MED CTR MEBANE ONLY): Streptococcus, Group A Screen (Direct): NEGATIVE

## 2016-07-31 MED ORDER — FLUTICASONE PROPIONATE 50 MCG/ACT NA SUSP: NASAL | 1 refills | Status: DC

## 2016-07-31 MED ORDER — AMOXICILLIN 500 MG PO CAPS
500.0000 mg | ORAL_CAPSULE | Freq: Once | ORAL | Status: AC
  Administered 2016-07-31: 500 mg via ORAL
  Filled 2016-07-31: qty 1

## 2016-07-31 MED ORDER — ACETAMINOPHEN 325 MG PO TABS
ORAL_TABLET | ORAL | Status: AC
  Filled 2016-07-31: qty 2

## 2016-07-31 MED ORDER — BENZONATATE 100 MG PO CAPS: 100.0000 mg | ORAL_CAPSULE | Freq: Three times a day (TID) | ORAL | 0 refills | Status: DC

## 2016-07-31 MED ORDER — ALBUTEROL SULFATE HFA 108 (90 BASE) MCG/ACT IN AERS
2.0000 | INHALATION_SPRAY | RESPIRATORY_TRACT | Status: DC
  Administered 2016-07-31: 2 via RESPIRATORY_TRACT
  Filled 2016-07-31: qty 6.7

## 2016-07-31 MED ORDER — AMOXICILLIN 500 MG PO CAPS: 500.0000 mg | ORAL_CAPSULE | Freq: Three times a day (TID) | ORAL | 0 refills | Status: DC

## 2016-07-31 MED ORDER — ACETAMINOPHEN 325 MG PO TABS
650.0000 mg | ORAL_TABLET | Freq: Once | ORAL | Status: AC | PRN
  Administered 2016-07-31: 650 mg via ORAL

## 2016-07-31 NOTE — Discharge Instructions (Signed)
Tylenol for pain. Flonase spray twice per day to open sinuses. Inhaler for cough.

## 2016-07-31 NOTE — ED Provider Notes (Signed)
[facility] DEPT Provider Note   CSN: [id number] Arrival date & time: 07/31/16  1216  By signing my name below, I, Neta Mends, attest that this documentation has been prepared under the direction and in the presence of Tanna Furry, MD . Electronically Signed: Neta Mends, ED Scribe. 07/31/2016. 4:17 PM.    History   Chief Complaint Chief Complaint  Patient presents with  . Cough    The patient said she hgas been having cough and congestion for about two weeks.  She has also had a headache but it has gotten worse. She denies any symptoms of stroke and she is GCS of 15.  She was able to walk in .  The patient says she feels like her head is "full".  She has not taken anything but tylenol and it is not working.    Marland Kitchen Headache    The history is provided by the patient and a relative. The history is limited by a language barrier. No language interpreter was used.   HPI Comments:  Emily Hoover is a 71 y.o. female who presents to the Emergency Department complaining of persistent, nonproductive cough x 2 weeks. Pt complains of associated worsening headache, sore throat, SOB. Pt states that her head feels "full," rates her headache pain at 8/10, and that the pain is primarily in the back of her head. No known allergies no medication. Pt has taken tylenol with no relief. Pt denies fever, sinus pain, rhinorrhea.   Past Medical History:  Diagnosis Date  . Diabetes mellitus   . Diabetes mellitus without complication ([place])   . Hypercholesteremia   . Hypertension   . Renal insufficiency     Patient Active Problem List   Diagnosis Date Noted  . 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 Renal insufficiency 10/10/2015  . Staphylococcus aureus bacteremia 09/19/2015  . UTI (lower urinary tract infection) 09/16/2015  . Hip pain 09/16/2015  . Hypertension 09/16/2015  . Pain management 09/16/2015  . Vertigo 09/07/2015  . Labyrinthitis 09/07/2015  . Neck pain 09/07/2015  . Diabetes mellitus  without complication ([place]) 123456  . Labyrinthitis of right ear   . Chronic neck pain   . Uncontrollable vomiting   . Diabetes ([place]) 06/30/2013    History reviewed. No pertinent surgical history.  OB History    Gravida Para Term Preterm AB Living   0 0 0 0 0     SAB TAB Ectopic Multiple Live Births   0 0 0           Home Medications    Prior to Admission medications   Medication Sig Start Date End Date Taking? Authorizing Provider  amLODipine (NORVASC) 5 MG tablet Take 1 tablet (5 mg total) by mouth daily. 09/27/15   Maren Reamer, MD  amoxicillin (AMOXIL) 500 MG capsule Take 1 capsule (500 mg total) by mouth 3 (three) times daily. 07/31/16   Tanna Furry, MD  benzonatate (TESSALON) 100 MG capsule Take 1 capsule (100 mg total) by mouth every 8 (eight) hours. 07/31/16   Tanna Furry, MD  cephALEXin (KEFLEX) 500 MG capsule Take 1 capsule (500 mg total) by mouth 3 (three) times daily. 02/02/16   Merryl Hacker, MD  doxycycline (VIBRA-TABS) 100 MG tablet Take 1 tablet (100 mg total) by mouth 2 (two) times daily. 10/31/15   Maren Reamer, MD  fluticasone Asencion Islam) 50 MCG/ACT nasal spray 2 sprays each nares bid 07/31/16   Tanna Furry, MD  HYDROcodone-acetaminophen (NORCO/VICODIN) 5-325 MG tablet Take 1  tablet by mouth every 6 (six) hours as needed for moderate pain. Reported on 10/13/2015    Historical Provider, MD  ibuprofen (ADVIL,MOTRIN) 200 MG tablet Take 200 mg by mouth every 6 (six) hours as needed for moderate pain. Reported on 10/13/2015    Historical Provider, MD  insulin glargine (LANTUS) 100 UNIT/ML injection Inject 0.12 mLs (12 Units total) into the skin at bedtime. 02/21/16   Maren Reamer, MD  Insulin Syringe-Needle U-100 (INSULIN SYRINGE 1CC/30GX5/16") 30G X 5/16" 1 ML MISC Check blood sugar TID & QHS 10/10/15   Maren Reamer, MD  meclizine (ANTIVERT) 25 MG tablet Take 1 tablet (25 mg total) by mouth 3 (three) times daily as needed for dizziness. 09/07/15   Ripley Fraise, MD   metFORMIN (GLUCOPHAGE) 500 MG tablet Take 2 tablets (1,000 mg total) by mouth 2 (two) times daily with a meal. 11/01/15   Maren Reamer, MD  traMADol (ULTRAM) 50 MG tablet Take by mouth every 6 (six) hours as needed. Reported on 10/13/2015    Historical Provider, MD    Family History Family History  Problem Relation Age of Onset  . Family history unknown: Yes  . CAD Mother   . CAD Father   . Hypertension Father   . Diabetes Neg Hx   . Cancer Neg Hx     Social History Social History  Substance Use Topics  . Smoking status: Never Smoker  . Smokeless tobacco: Never Used  . Alcohol use No     Allergies   Patient has no known allergies.   Review of Systems Review of Systems  Constitutional: Negative for appetite change, chills, diaphoresis, fatigue and fever.  HENT: Positive for sore throat. Negative for mouth sores, rhinorrhea, sinus pain and trouble swallowing.   Eyes: Negative for visual disturbance.  Respiratory: Positive for cough and shortness of breath. Negative for chest tightness and wheezing.   Cardiovascular: Negative for chest pain.  Gastrointestinal: Negative for abdominal distention, abdominal pain, diarrhea, nausea and vomiting.  Endocrine: Negative for polydipsia, polyphagia and polyuria.  Genitourinary: Negative for dysuria, frequency and hematuria.  Musculoskeletal: Negative for gait problem.  Skin: Negative for color change, pallor and rash.  Neurological: Positive for headaches. Negative for dizziness, syncope and light-headedness.  Hematological: Does not bruise/bleed easily.  Psychiatric/Behavioral: Negative for behavioral problems and confusion.  All other systems reviewed and are negative.    Physical Exam Updated Vital Signs BP 129/76 (BP Location: Left Arm)   Pulse 96   Temp 98.4 F (36.9 C) (Oral)   Resp 18   SpO2 98%   Physical Exam  Constitutional: She is oriented to person, place, and time. She appears well-developed and  well-nourished. No distress.  HENT:  Head: Normocephalic.  Eyes: Conjunctivae are normal. Pupils are equal, round, and reactive to light. No scleral icterus.  Neck: Normal range of motion. Neck supple. No thyromegaly present.  Cardiovascular: Normal rate and regular rhythm.  Exam reveals no gallop and no friction rub.   No murmur heard. Pulmonary/Chest: Effort normal and breath sounds normal. No respiratory distress. She has no wheezes. She has no rales.  Abdominal: Soft. Bowel sounds are normal. She exhibits no distension. There is no tenderness. There is no rebound.  Musculoskeletal: Normal range of motion.  Neurological: She is alert and oriented to person, place, and time.  Skin: Skin is warm and dry. No rash noted.  Psychiatric: She has a normal mood and affect. Her behavior is normal.     ED  Treatments / Results  DIAGNOSTIC STUDIES:  Oxygen Saturation is 98% on RA, normal by my interpretation.    COORDINATION OF CARE:  4:28 PM Discussed treatment plan with pt at bedside and pt agreed to plan.   Labs (all labs ordered are listed, but only abnormal results are displayed) Labs Reviewed  RAPID STREP SCREEN (NOT AT [facility])  CULTURE, GROUP A STREP [facility])    EKG  EKG Interpretation None       Radiology Dg Chest 2 View  Result Date: 07/31/2016 CLINICAL DATA:  Nonproductive cough and chest congestion for the past 2 weeks. History of hypertension and diabetes and renal insufficiency. Nonsmoker. EXAM: CHEST  2 VIEW COMPARISON:  PA and lateral chest x-ray dated January 19, 2011 FINDINGS: The lungs are well-expanded. The interstitial markings have become more conspicuous especially in the lower lobes. There is no alveolar infiltrate. There is no pleural effusion. The heart and pulmonary vascularity are normal. There is calcification in the wall of the aortic arch and tortuosity of the descending thoracic aorta. The bony thorax exhibits no acute abnormality. IMPRESSION: Mild interstitial  prominence bilaterally may reflect acute bronchitic change. There is no alveolar pneumonia nor CHF. Thoracic aortic atherosclerosis. Electronically Signed   By: David  Martinique M.D.   On: 07/31/2016 13:47    Procedures Procedures (including critical care time)  Medications Ordered in ED Medications  acetaminophen (TYLENOL) 325 MG tablet (not administered)  albuterol (PROVENTIL HFA;VENTOLIN HFA) 108 (90 Base) MCG/ACT inhaler 2 puff (not administered)  amoxicillin (AMOXIL) capsule 500 mg (not administered)  acetaminophen (TYLENOL) tablet 650 mg (650 mg Oral Given 07/31/16 1326)     Initial Impression / Assessment and Plan / ED Course  I have reviewed the triage vital signs and the nursing notes.  Pertinent labs & imaging results that were available during my care of the patient were reviewed by me and considered in my medical decision making (see chart for details).     Doubt flu without fever. clinically and radiographically no Pneumonia.   Final Clinical Impressions(s) / ED Diagnoses   Final diagnoses:  Acute sinusitis, recurrence not specified, unspecified location    New Prescriptions New Prescriptions   AMOXICILLIN (AMOXIL) 500 MG CAPSULE    Take 1 capsule (500 mg total) by mouth 3 (three) times daily.   BENZONATATE (TESSALON) 100 MG CAPSULE    Take 1 capsule (100 mg total) by mouth every 8 (eight) hours.   FLUTICASONE (FLONASE) 50 MCG/ACT NASAL SPRAY    2 sprays each nares bid  I personally performed the services described in this documentation, which was scribed in my presence. The recorded information has been reviewed and is accurate.     Tanna Furry, MD 07/31/16 [phone]

## 2016-07-31 NOTE — ED Triage Notes (Signed)
The patient said she hgas been having cough and congestion for about two weeks.  She has also had a headache but it has gotten worse. She denies any symptoms of stroke and she is GCS of 15.  She was able to walk in .  The patient says she feels like her head is "full".  She has not taken anything but tylenol and it is not working.  She rates her headache 8/10.

## 2016-07-31 NOTE — ED Notes (Signed)
Pt and daughter states they understands instructions. Pt home stable with steady gait.

## 2016-08-02 LAB — CULTURE, GROUP A STREP (THRC)

## 2016-09-11 MED FILL — LANTUS 100 UNITS/ML VIAL: 100 | 28 days supply | Qty: 10 | Fill #3

## 2016-11-22 ENCOUNTER — Encounter: Payer: Self-pay | Admitting: Internal Medicine

## 2016-11-23 ENCOUNTER — Encounter: Payer: Self-pay | Admitting: Internal Medicine

## 2016-11-26 ENCOUNTER — Encounter: Payer: Self-pay | Admitting: Internal Medicine

## 2017-01-01 IMAGING — CT CT RENAL STONE PROTOCOL
2 of 4 series · 14 of 46 positions shown, 16 images · non-contrast
Comparison: Pelvic MRI 09/13/2015

CLINICAL DATA: Hematuria and groin pain. Difficulty with
ambulation.

EXAM:
CT ABDOMEN AND PELVIS WITHOUT CONTRAST
TECHNIQUE: Multidetector CT imaging of the abdomen and pelvis was performed
following the standard protocol without IV contrast.

[Series 2: renal stone 5mm · axial · 0.67mm/px · z∈[-827,-457]mm · 11 of 90 slices shown, 13 images]
[im 8/90  soft-tissue]
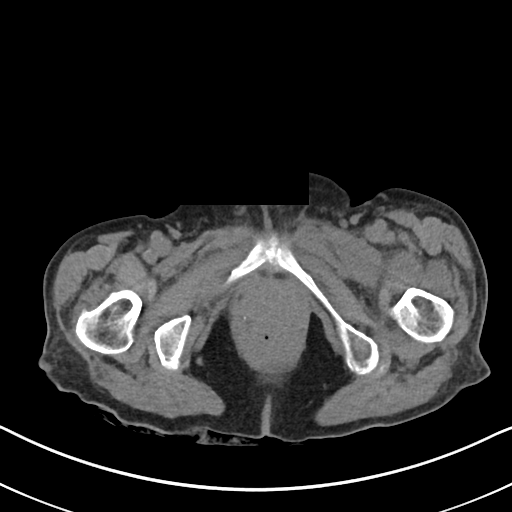
[im 8/90  bone]
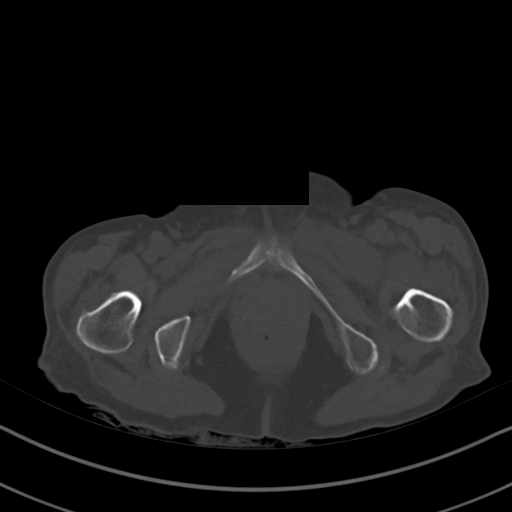
[im 15/90  soft-tissue]
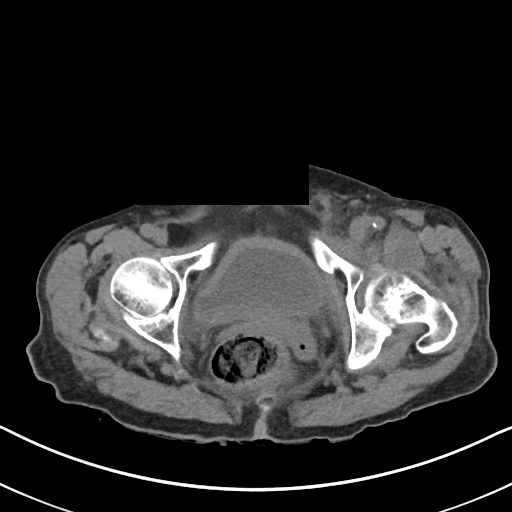
[im 22/90  soft-tissue]
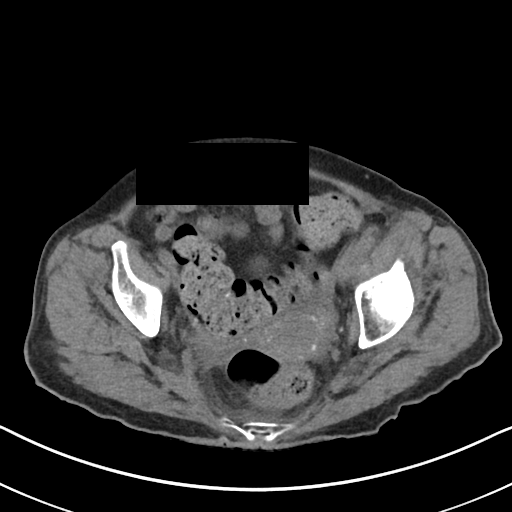
[im 29/90  soft-tissue]
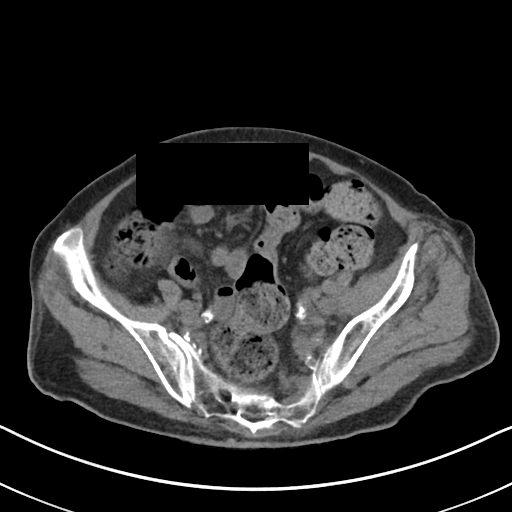
[im 36/90  soft-tissue]
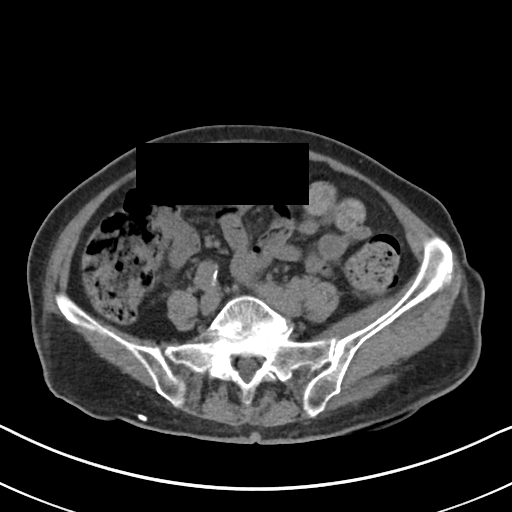
[im 47/90  soft-tissue]
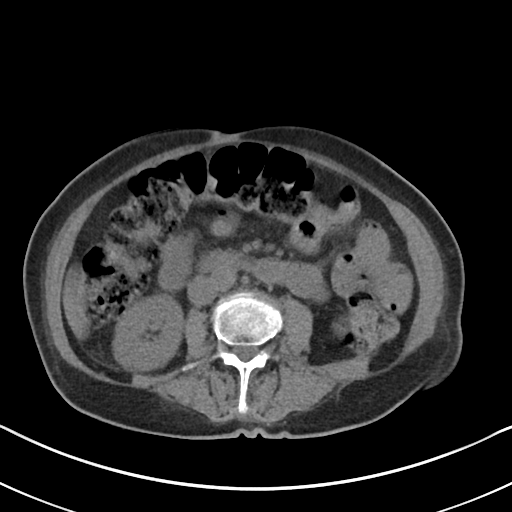
[im 54/90  soft-tissue]
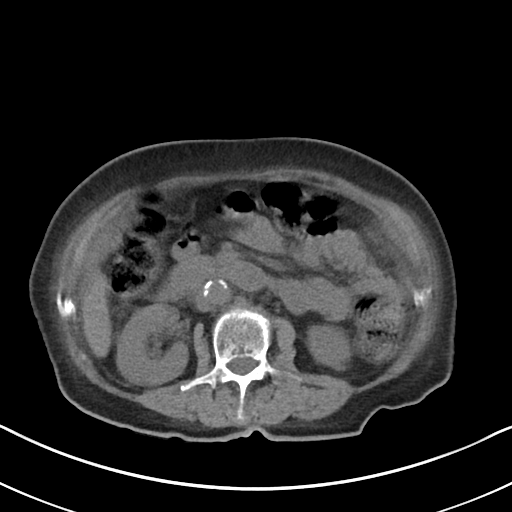
[im 61/90  soft-tissue]
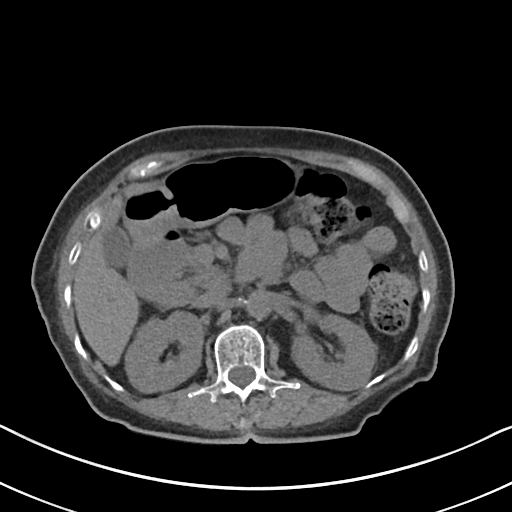
[im 68/90  soft-tissue]
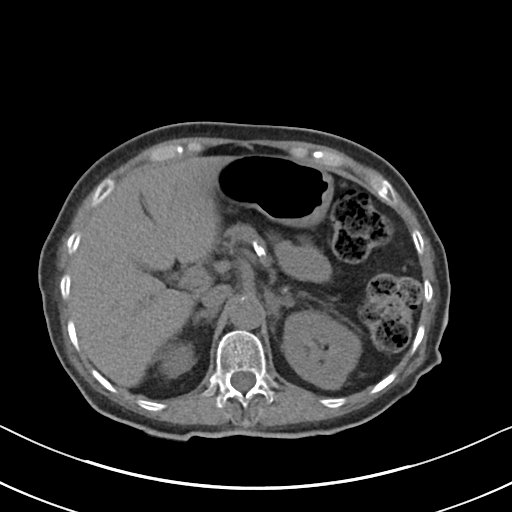
[im 68/90  bone]
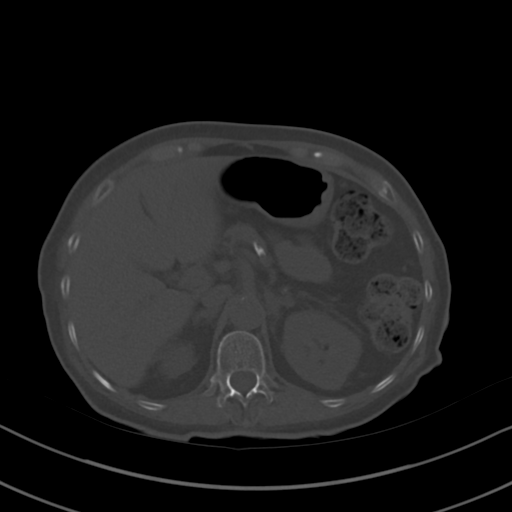
[im 75/90  soft-tissue]
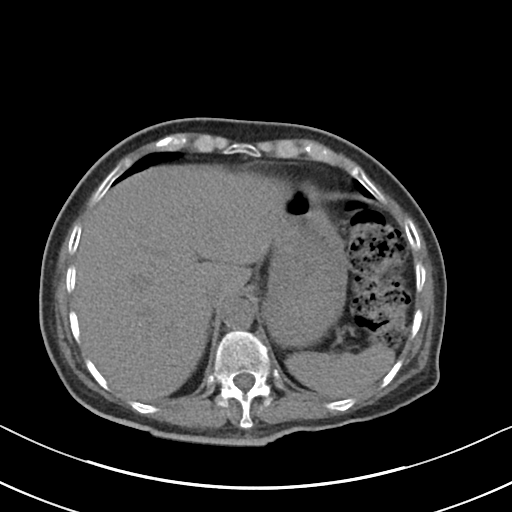
[im 82/90  soft-tissue]
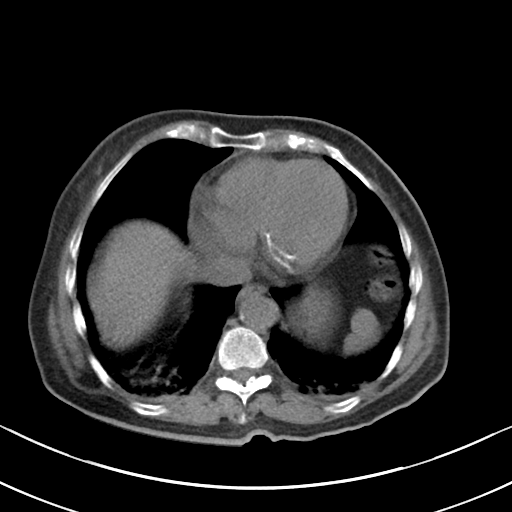

[Series 4: renal stone 3.0 cor · coronal · 0.59mm/px · 3 of 72 slices shown]
[im 24/72  soft-tissue]
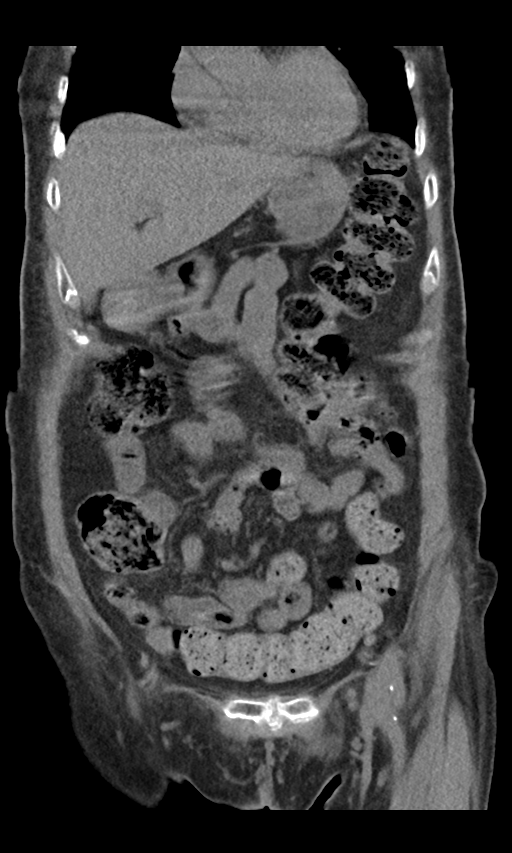
[im 32/72  soft-tissue]
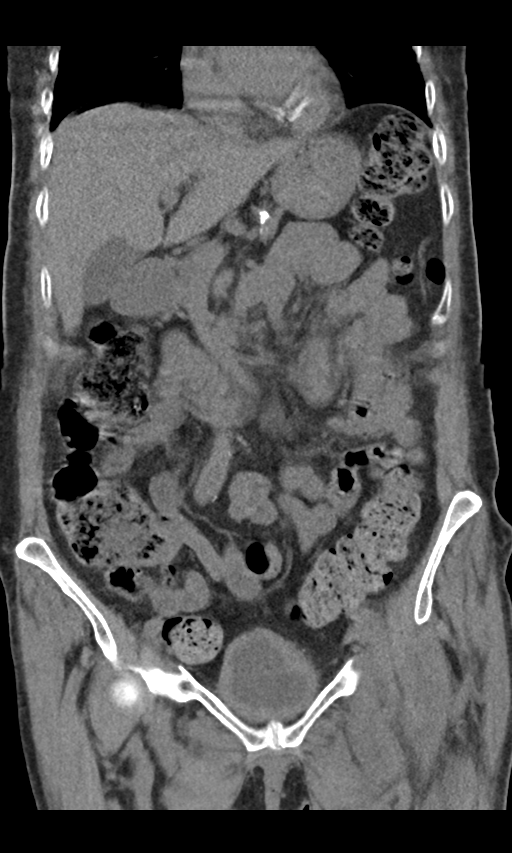
[im 40/72  soft-tissue]
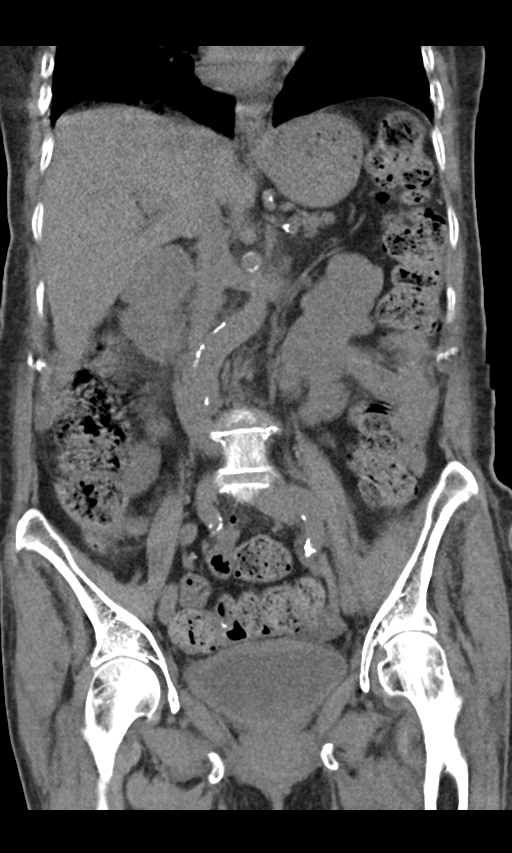

[14 of 46 positions shown; findings below may reference images not displayed]

FINDINGS: Lower chest: Mild motion artifact. Dependent atelectasis with trace
pleural effusions. There are coronary artery calcifications. Heart
is normal in size.

Liver: Motion artifact inferiorly. Allowing for this and lack of
intravenous contrast, no focal lesion is seen.

Hepatobiliary: Gallbladder physiologically distended, no calcified
stone. No biliary dilatation.

Pancreas: Atrophic.  No ductal dilatation or inflammation.

Spleen: Normal.

Adrenal glands: No nodule.  Mild thickening on the left.

Kidneys: Motion artifact through the lower right kidney. No
hydronephrosis or urolithiasis. Mild nonspecific bilateral
perinephric edema.

Stomach/Bowel: Stomach physiologically distended. There are no
dilated or thickened small bowel loops. Moderate volume of stool
throughout the colon without colonic wall thickening. The appendix
is normal.

Vascular/Lymphatic: No retroperitoneal adenopathy, portions obscured
by motion. Abdominal aorta is normal in caliber. Moderate
atherosclerosis without aneurysm.

Reproductive: Uterus remains in situ.  No evidence of adnexal mass.

Bladder: Mildly distended but with diffuse wall thickening. Mild
perivesicular edema.

Other: No free air, free fluid, or intra-abdominal fluid collection.

Musculoskeletal: Soft tissue stranding about iliacus muscle and left
hip adductor musculature, better appreciated on MRI. The additional
areas of muscle edema are not as well characterized. No soft tissue
air or developing fluid collection allowing for noncontrast exam.
There are no acute or suspicious osseous abnormalities. Bilateral L5
pars interarticularis defects with listhesis. Degenerative disc
disease at L4-L5. No bony destructive change.
IMPRESSION: 1. Urinary bladder wall thickening and perivesicular edema,
suggestive urinary tract infection. No urolithiasis or
hydronephrosis.
2. Left iliopsoas and hip adductor muscle edema, better appreciated
on prior MRI. No developing fluid collections or soft tissue air.

## 2017-08-01 ENCOUNTER — Encounter: Payer: Self-pay | Admitting: Family Medicine

## 2017-08-01 ENCOUNTER — Ambulatory Visit: Payer: Self-pay | Attending: Family Medicine | Admitting: Family Medicine

## 2017-08-01 VITALS — BP 182/86 | HR 86 | Temp 98.6°F | Resp 16 | Ht <= 58 in | Wt 98.4 lb

## 2017-08-01 DIAGNOSIS — Z9111 Patient's noncompliance with dietary regimen: Secondary | ICD-10-CM | POA: Insufficient documentation

## 2017-08-01 DIAGNOSIS — Z79899 Other long term (current) drug therapy: Secondary | ICD-10-CM | POA: Insufficient documentation

## 2017-08-01 DIAGNOSIS — I10 Essential (primary) hypertension: Secondary | ICD-10-CM | POA: Insufficient documentation

## 2017-08-01 DIAGNOSIS — Z9119 Patient's noncompliance with other medical treatment and regimen: Secondary | ICD-10-CM | POA: Insufficient documentation

## 2017-08-01 DIAGNOSIS — E119 Type 2 diabetes mellitus without complications: Secondary | ICD-10-CM | POA: Insufficient documentation

## 2017-08-01 DIAGNOSIS — Z794 Long term (current) use of insulin: Secondary | ICD-10-CM | POA: Insufficient documentation

## 2017-08-01 DIAGNOSIS — E7800 Pure hypercholesterolemia, unspecified: Secondary | ICD-10-CM | POA: Insufficient documentation

## 2017-08-01 LAB — GLUCOSE, POCT (MANUAL RESULT ENTRY): POC GLUCOSE: 289 mg/dL — AB (ref 70–99)

## 2017-08-01 LAB — POCT GLYCOSYLATED HEMOGLOBIN (HGB A1C): HEMOGLOBIN A1C: 13.4

## 2017-08-01 MED ORDER — AMLODIPINE BESYLATE 5 MG PO TABS: 5.0000 mg | ORAL_TABLET | Freq: Every day | ORAL | 5 refills | Status: DC

## 2017-08-01 MED ORDER — METFORMIN HCL 500 MG PO TABS: 1000.0000 mg | ORAL_TABLET | Freq: Two times a day (BID) | ORAL | 5 refills | Status: DC

## 2017-08-01 MED ORDER — INSULIN GLARGINE 100 UNIT/ML ~~LOC~~ SOLN: SUBCUTANEOUS | 11 refills | Status: DC

## 2017-08-01 MED ORDER — ATORVASTATIN CALCIUM 20 MG PO TABS: 20.0000 mg | ORAL_TABLET | Freq: Every day | ORAL | 5 refills | Status: DC

## 2017-08-01 MED ORDER — INSULIN GLARGINE 100 UNIT/ML ~~LOC~~ SOLN: 15.0000 [IU] | Freq: Every day | SUBCUTANEOUS | 5 refills | Status: DC

## 2017-08-01 MED FILL — !LANTUS 100 UNITS/ML VIAL: 100 | 28 days supply | Qty: 10 | Fill #0

## 2017-08-01 NOTE — Progress Notes (Signed)
Subjective:  Patient ID: Emily Hoover, female    DOB: Aug 25, 1945  Age: 72 y.o. MRN: [id number]  CC: Diabetes  HPI Emily Hoover is a 72 year old female with a history of type 2 Diabetes Mellitus (A1c 13.4), Hypertension who presents today to get established with me as she was previously followed by Dr Janne Napoleon and has not been seen in the clinic for 20 months.  She has run ut of her insulin and her antihypertensive hence her elevated blood pressure and elevated A1c. She has not been compliant with a Diabetic diet or exercise.  She is accompanied by a family member and seen with the aid of a Spanish video interpreter. She has no acute concerns today.  Past Medical History:  Diagnosis Date  . Diabetes mellitus   . Diabetes mellitus without complication ([place])   . Hypercholesteremia   . Hypertension   . Renal insufficiency     History reviewed. No pertinent surgical history.  No Known Allergies  Outpatient Medications Prior to Visit  Medication Sig Dispense Refill  . amoxicillin (AMOXIL) 500 MG capsule Take 1 capsule (500 mg total) by mouth 3 (three) times daily. 21 capsule 0  . benzonatate (TESSALON) 100 MG capsule Take 1 capsule (100 mg total) by mouth every 8 (eight) hours. 21 capsule 0  . cephALEXin (KEFLEX) 500 MG capsule Take 1 capsule (500 mg total) by mouth 3 (three) times daily. 21 capsule 0  . doxycycline (VIBRA-TABS) 100 MG tablet Take 1 tablet (100 mg total) by mouth 2 (two) times daily. 28 tablet 0  . fluticasone (FLONASE) 50 MCG/ACT nasal spray 2 sprays each nares bid 10 g 1  . HYDROcodone-acetaminophen (NORCO/VICODIN) 5-325 MG tablet Take 1 tablet by mouth every 6 (six) hours as needed for moderate pain. Reported on 10/13/2015    . ibuprofen (ADVIL,MOTRIN) 200 MG tablet Take 200 mg by mouth every 6 (six) hours as needed for moderate pain. Reported on 10/13/2015    . Insulin Syringe-Needle U-100 (INSULIN SYRINGE 1CC/30GX5/16") 30G X 5/16" 1 ML MISC Check blood sugar TID  & QHS 100 each 3  . meclizine (ANTIVERT) 25 MG tablet Take 1 tablet (25 mg total) by mouth 3 (three) times daily as needed for dizziness. 15 tablet 0  . traMADol (ULTRAM) 50 MG tablet Take by mouth every 6 (six) hours as needed. Reported on 10/13/2015    . amLODipine (NORVASC) 5 MG tablet Take 1 tablet (5 mg total) by mouth daily. 90 tablet 3  . insulin glargine (LANTUS) 100 UNIT/ML injection Inject 0.12 mLs (12 Units total) into the skin at bedtime. 10 mL 0  . metFORMIN (GLUCOPHAGE) 500 MG tablet Take 2 tablets (1,000 mg total) by mouth 2 (two) times daily with a meal. 60 tablet 0   No facility-administered medications prior to visit.     ROS Review of Systems  Constitutional: Negative for activity change, appetite change and fatigue.  HENT: Negative for congestion, sinus pressure and sore throat.   Eyes: Negative for visual disturbance.  Respiratory: Negative for cough, chest tightness, shortness of breath and wheezing.   Cardiovascular: Negative for chest pain and palpitations.  Gastrointestinal: Negative for abdominal distention, abdominal pain and constipation.  Endocrine: Negative for polydipsia.  Genitourinary: Negative for dysuria and frequency.  Musculoskeletal: Negative for arthralgias and back pain.  Skin: Negative for rash.  Neurological: Negative for tremors, light-headedness and numbness.  Hematological: Does not bruise/bleed easily.  Psychiatric/Behavioral: Negative for agitation and behavioral problems.    Objective:  BP (!) 182/86 (BP Location: Right Arm, Patient Position: Sitting, Cuff Size: Normal)   Pulse 86   Temp 98.6 F (37 C) (Oral)   Resp 16   Ht 4' 9"  (1.448 m)   Wt 98 lb 6.4 oz (44.6 kg)   SpO2 98%   BMI 21.29 kg/m   BP/Weight 08/01/2017 07/31/2016 3/74/8270  Systolic BP 786 754 492  Diastolic BP 86 57 73  Wt. (Lbs) 98.4 - -  BMI 21.29 - -      Physical Exam  Constitutional: She is oriented to person, place, and time. She appears well-developed  and well-nourished.  Cardiovascular: Normal rate, normal heart sounds and intact distal pulses.  No murmur heard. Pulmonary/Chest: Effort normal and breath sounds normal. She has no wheezes. She has no rales. She exhibits no tenderness.  Abdominal: Soft. Bowel sounds are normal. She exhibits no distension and no mass. There is no tenderness.  Musculoskeletal: Normal range of motion.  Neurological: She is alert and oriented to person, place, and time.  Skin: Skin is warm and dry.  Psychiatric: She has a normal mood and affect.    CMP Latest Ref Rng & Units 02/01/2016 10/31/2015 09/27/2015  Glucose 65 - 99 mg/dL 133(H) 220(H) 142(H)  BUN 6 - 20 mg/dL 18 19 14   Creatinine 0.44 - 1.00 mg/dL 0.73 0.79 0.55(L)  Sodium 135 - 145 mmol/L 134(L) 136 136  Potassium 3.5 - 5.1 mmol/L 3.2(L) 3.2(L) 3.9  Chloride 101 - 111 mmol/L 99(L) 99 97(L)  CO2 22 - 32 mmol/L 24 27 24   Calcium 8.9 - 10.3 mg/dL 9.2 9.5 8.8  Total Protein 6.5 - 8.1 g/dL 6.9 - -  Total Bilirubin 0.3 - 1.2 mg/dL 2.1(H) - -  Alkaline Phos 38 - 126 U/L 64 - -  AST 15 - 41 U/L 17 - -  ALT 14 - 54 U/L 12(L) - -    Lab Results  Component Value Date   HGBA1C 13.4 08/01/2017    Assessment & Plan:   1. Diabetes mellitus without complication ([place]) Uncontrolled with A1c of 13.4 due to running out of medications which I have refilled Increase dose of Lantus Commenced on Atorvastatin for primary prevention of cardiovascular disease Counseled on Diabetic diet, my plate method, 010 minutes of moderate intensity exercise/week Keep blood sugar logs with fasting goals of 80-120 mg/dl, random of less than 180 and in the event of sugars less than 60 mg/dl or greater than 400 mg/dl please notify the clinic ASAP. It is recommended that you undergo annual eye exams and annual foot exams. Pneumonia vaccine is recommended. - POCT glucose (manual entry) - POCT glycosylated hemoglobin (Hb A1C) - CMP14+EGFR; Future - Lipid panel; Future -  Microalbumin/Creatinine Ratio, Urine; Future - metFORMIN (GLUCOPHAGE) 500 MG tablet; Take 2 tablets (1,000 mg total) by mouth 2 (two) times daily with a meal.  Dispense: 60 tablet; Refill: 5 - insulin glargine (LANTUS) 100 UNIT/ML injection; Inject 0.15 mLs (15 Units total) into the skin at bedtime.  Dispense: 30 mL; Refill: 5 - atorvastatin (LIPITOR) 20 MG tablet; Take 1 tablet (20 mg total) by mouth daily.  Dispense: 30 tablet; Refill: 5  2. Essential hypertension Uncontrolled due to running out of antihypertensives Counseled on blood pressure goal of less than 130/80, low-sodium, DASH diet, medication compliance, 150 minutes of moderate intensity exercise per week. Discussed medication compliance, adverse effects. - amLODipine (NORVASC) 5 MG tablet; Take 1 tablet (5 mg total) by mouth daily.  Dispense: 30 tablet; Refill: 5  Meds ordered this encounter  Medications  . amLODipine (NORVASC) 5 MG tablet    Sig: Take 1 tablet (5 mg total) by mouth daily.    Dispense:  30 tablet    Refill:  5  . metFORMIN (GLUCOPHAGE) 500 MG tablet    Sig: Take 2 tablets (1,000 mg total) by mouth 2 (two) times daily with a meal.    Dispense:  60 tablet    Refill:  5  . insulin glargine (LANTUS) 100 UNIT/ML injection    Sig: Inject 0.15 mLs (15 Units total) into the skin at bedtime.    Dispense:  30 mL    Refill:  5  . atorvastatin (LIPITOR) 20 MG tablet    Sig: Take 1 tablet (20 mg total) by mouth daily.    Dispense:  30 tablet    Refill:  5    Follow-up: Return in about 3 months (around 10/30/2017) for Follow-up of diabetes mellitus.   Arnoldo Morale MD

## 2017-08-01 NOTE — Patient Instructions (Signed)
Diabetes mellitus y nutrición  Diabetes Mellitus and Nutrition  Si sufre de diabetes (diabetes mellitus), es muy importante tener hábitos alimenticios saludables debido a que sus niveles de azúcar en la sangre (glucosa) se ven afectados en gran medida por lo que come y bebe. Comer alimentos saludables en las cantidades adecuadas, aproximadamente a la misma hora todos los días, lo ayudará a:  · Controlar la glucemia.  · Disminuir el riesgo de sufrir una enfermedad cardíaca.  · Mejorar la presión arterial.  · Alcanzar o mantener un peso saludable.    Todas las personas que sufren de diabetes son diferentes y cada una tiene necesidades diferentes en cuanto a un plan de alimentación. El médico puede recomendarle que trabaje con un especialista en dietas y nutrición (nutricionista) para elaborar el mejor plan para usted. Su plan de alimentación puede variar según factores como:  · Las calorías que necesita.  · Los medicamentos que toma.  · Su peso.  · Sus niveles de glucemia, presión arterial y colesterol.  · Su nivel de actividad.  · Otras afecciones que tenga, como enfermedades cardíacas o renales.    ¿Cómo me afectan los carbohidratos?  Los carbohidratos afectan el nivel de glucemia más que cualquier otro tipo de alimento. La ingesta de carbohidratos naturalmente aumenta la cantidad glucosa en la sangre. El recuento de carbohidratos es un método destinado a llevar un registro de la cantidad de carbohidratos que se ingieren. El recuento de carbohidratos es importante para mantener la glucemia a un nivel saludable, en especial si utiliza insulina o toma determinados medicamentos por vía oral para la diabetes.  Es importante saber la cantidad de carbohidratos que se pueden ingerir en cada comida sin correr ningún riesgo. Esto es diferente en cada persona. El nutricionista puede ayudarlo a calcular la cantidad de carbohidratos que debe ingerir en cada comida y colación.   Los alimentos que contienen carbohidratos incluyen:  · Pan, cereal, arroz, pasta y galletas.  · Papas y maíz.  · Guisantes, frijoles y lentejas.  · Leche y yogur.  · Frutas y jugo.  · Postres, como pasteles, galletitas, helado y caramelos.    ¿Cómo me afecta el alcohol?  El alcohol puede provocar disminuciones súbitas de la glucemia (hipoglucemia), en especial si utiliza insulina o toma determinados medicamentos por vía oral para la diabetes. La hipoglucemia es una afección potencialmente mortal. Los síntomas de la hipoglucemia (somnolencia, mareos y confusión) son similares a los síntomas de haber consumido demasiado alcohol.  Si el médico afirma que el alcohol es seguro para usted, siga estas pautas:  · Limite el consumo de alcohol a no más de 1 medida por día si es mujer y no está embarazada, y a 2 medidas si es hombre. Una medida equivale a 12 oz (355 ml) de cerveza, 5 oz (148 ml) de vino o 1½ oz (44 ml) de bebidas de alta graduación alcohólica.  · No beba con el estómago vacío.  · Manténgase hidratado con agua, gaseosas dietéticas o té helado sin azúcar.  · Tenga en cuenta que las gaseosas comunes, los jugos y otros refrescos pueden contener mucha azúcar y se deben contar como carbohidratos.    Consejos para seguir este plan  Leer las etiquetas de los alimentos  · Comience por controlar el tamaño de la porción en la etiqueta. La cantidad de calorías, carbohidratos, grasas y otros nutrientes mencionados en la etiqueta se basan en una porción del alimento. Muchos alimentos contienen más de una porción por envase.  · Verifique la cantidad total de gramos (g)   de carbohidratos totales en una porción. Puede calcular la cantidad de porciones de carbohidratos al dividir el total de carbohidratos por 15. Por ejemplo, si un alimento posee un total de 30 g de carbohidratos, equivale a 2 porciones de carbohidratos.  · Verifique la cantidad de gramos (g) de grasas saturadas y grasas trans  en una porción. Escoja alimentos que no contengan grasa o que tengan un bajo contenido.  · Controle la cantidad de miligramos (mg) de sodio en una porción. La mayoría de las personas deben limitar la ingesta de sodio total a menos de 2300 mg por día.  · Siempre consulte la información nutricional de los alimentos etiquetados como “con bajo contenido de grasa” o “sin grasa”. Estos alimentos pueden ser más altos en azúcar agregada o en carbohidratos refinados y deben evitarse.  · Hable con el nutricionista para identificar sus objetivos diarios en cuanto a los nutrientes mencionados en la etiqueta.  De compras  · Evite comprar alimentos procesados, enlatados o prehechos. Estos alimentos tienden a tener mayor cantidad de grasa, sodio y azúcar agregada.  · Compre en la zona exterior de la tienda de comestibles. Esta incluye frutas y vegetales frescos, granos a granel, carnes frescas y productos lácteos frescos.  Cocción  · Utilice métodos de cocción a baja temperatura, como hornear, en lugar de métodos de cocción a alta temperatura, como freír en abundante aceite.  · Cocine con aceites saludables, como el aceite de oliva, canola o girasol.  · Evite cocinar con manteca, crema o carnes con alto contenido de grasa.  Planificación de las comidas  · Consuma las comidas y las colaciones de forma regular, preferentemente a la misma hora todos los días. Evite pasar largos períodos de tiempo sin comer.  · Consuma alimentos ricos en fibra, como frutas frescas, verduras, frijoles y cereales integrales. Consulte al nutricionista sobre cuántas porciones de carbohidratos puede consumir en cada comida.  · Consuma entre 4 y 6 onzas de proteínas magras por día, como carnes magras, pollo, pescado, huevos o tofu. 1 onza equivale a 1 onza de carne, pollo o pescado, 1 huevo, o 1/4 taza de tofu.  · Coma algunos alimentos por día que contengan grasas saludables, como aguacates, frutos secos, semillas y pescado.  Estilo de vida     · Controle su nivel de glucemia con regularidad.  · Haga ejercicio al menos 30 minutos, 5 días o más por semana, o como se lo haya indicado el médico.  · Tome los medicamentos como se lo haya indicado el médico.  · No consuma ningún producto que contenga nicotina o tabaco, como cigarrillos y cigarrillos electrónicos. Si necesita ayuda para dejar de fumar, consulte al médico.  · Trabaje con un asesor o instructor en diabetes para identificar estrategias para controlar el estrés y cualquier desafío emocional y social.  ¿Cuáles son algunas de las preguntas que puedo hacerle a mi médico?  · ¿Es necesario que me reúna con un instructor en diabetes?  · ¿Es necesario que me reúna con un nutricionista?  · ¿A qué número puedo llamar si tengo preguntas?  · ¿Cuáles son los mejores momentos para controlar la glucemia?  Dónde encontrar más información:  · Asociación Americana de la Diabetes (American Diabetes Association): diabetes.org/food-and-fitness/food  · Academia de Nutrición y Dietética (Academy of Nutrition and Dietetics): www.eatright.org/resources/health/diseases-and-conditions/diabetes  · Instituto Nacional de la Diabetes y las Enfermedades Digestivas y Renales (National Institute of Diabetes and Digestive and Kidney Diseases) (Institutos Nacionales de Salud, NIH): www.niddk.nih.gov/health-information/diabetes/overview/diet-eating-physical-activity  Resumen  · Un plan de alimentación saludable   lo ayudará a controlar la glucemia y mantener un estilo de vida saludable.  · Trabajar con un especialista en dietas y nutrición (nutricionista) puede ayudarlo a elaborar el mejor plan de alimentación para usted.  · Tenga en cuenta que los carbohidratos y el alcohol tienen efectos inmediatos en sus niveles de glucemia. Es importante contar los carbohidratos y consumir alcohol con prudencia.  Esta información no tiene como fin reemplazar el consejo del médico. Asegúrese de hacerle al médico cualquier pregunta que tenga.   Document Released: 10/02/2007 Document Revised: 10/15/2016 Document Reviewed: 10/15/2016  Elsevier Interactive Patient Education © 2018 Elsevier Inc.

## 2017-08-01 NOTE — Progress Notes (Signed)
Follow up No medication Eye concerns, unable to see from [facility]

## 2017-08-02 ENCOUNTER — Ambulatory Visit: Payer: Self-pay | Attending: Internal Medicine

## 2017-08-02 DIAGNOSIS — E119 Type 2 diabetes mellitus without complications: Secondary | ICD-10-CM | POA: Insufficient documentation

## 2017-08-02 NOTE — Progress Notes (Signed)
Patient here for lab visit only 

## 2017-08-03 LAB — CMP14+EGFR
ALBUMIN: 4 g/dL (ref 3.5–4.8)
ALT: 14 IU/L (ref 0–32)
AST: 16 IU/L (ref 0–40)
Albumin/Globulin Ratio: 1.4 (ref 1.2–2.2)
Alkaline Phosphatase: 93 IU/L (ref 39–117)
BUN / CREAT RATIO: 24 (ref 12–28)
BUN: 14 mg/dL (ref 8–27)
Bilirubin Total: 0.7 mg/dL (ref 0.0–1.2)
CALCIUM: 9.4 mg/dL (ref 8.7–10.3)
CO2: 24 mmol/L (ref 20–29)
CREATININE: 0.58 mg/dL (ref 0.57–1.00)
Chloride: 99 mmol/L (ref 96–106)
GFR, EST AFRICAN AMERICAN: 106 mL/min/{1.73_m2} (ref >59)
GFR, EST NON AFRICAN AMERICAN: 92 mL/min/{1.73_m2} (ref >59)
Globulin, Total: 2.9 g/dL (ref 1.5–4.5)
Glucose: 261 mg/dL — ABNORMAL HIGH (ref 65–99)
Potassium: 4 mmol/L (ref 3.5–5.2)
SODIUM: 139 mmol/L (ref 134–144)
TOTAL PROTEIN: 6.9 g/dL (ref 6.0–8.5)

## 2017-08-03 LAB — LIPID PANEL
CHOLESTEROL TOTAL: 243 mg/dL — AB (ref 100–199)
Chol/HDL Ratio: 5.1 ratio — ABNORMAL HIGH (ref 0.0–4.4)
HDL: 48 mg/dL (ref >39)
LDL Calculated: 164 mg/dL — ABNORMAL HIGH (ref 0–99)
Triglycerides: 155 mg/dL — ABNORMAL HIGH (ref 0–149)
VLDL Cholesterol Cal: 31 mg/dL (ref 5–40)

## 2017-08-03 LAB — MICROALBUMIN / CREATININE URINE RATIO
CREATININE, UR: 57.8 mg/dL
MICROALB/CREAT RATIO: 2710.6 mg/g{creat} — AB (ref 0.0–30.0)
Microalbumin, Urine: 1566.7 ug/mL

## 2017-08-09 ENCOUNTER — Telehealth: Payer: Self-pay

## 2017-08-09 NOTE — Telephone Encounter (Signed)
Patient was called and informed of lab results via interpretor. 

## 2017-10-30 ENCOUNTER — Ambulatory Visit: Payer: Self-pay | Attending: Family Medicine | Admitting: Family Medicine

## 2017-10-30 ENCOUNTER — Encounter: Payer: Self-pay | Admitting: Family Medicine

## 2017-10-30 VITALS — BP 184/94 | HR 79 | Temp 98.0°F | Ht <= 58 in | Wt 98.2 lb

## 2017-10-30 DIAGNOSIS — Z1211 Encounter for screening for malignant neoplasm of colon: Secondary | ICD-10-CM

## 2017-10-30 DIAGNOSIS — E7800 Pure hypercholesterolemia, unspecified: Secondary | ICD-10-CM | POA: Insufficient documentation

## 2017-10-30 DIAGNOSIS — Z794 Long term (current) use of insulin: Secondary | ICD-10-CM | POA: Insufficient documentation

## 2017-10-30 DIAGNOSIS — Z9114 Patient's other noncompliance with medication regimen: Secondary | ICD-10-CM | POA: Insufficient documentation

## 2017-10-30 DIAGNOSIS — Z1231 Encounter for screening mammogram for malignant neoplasm of breast: Secondary | ICD-10-CM

## 2017-10-30 DIAGNOSIS — E119 Type 2 diabetes mellitus without complications: Secondary | ICD-10-CM | POA: Insufficient documentation

## 2017-10-30 DIAGNOSIS — N289 Disorder of kidney and ureter, unspecified: Secondary | ICD-10-CM | POA: Insufficient documentation

## 2017-10-30 DIAGNOSIS — I10 Essential (primary) hypertension: Secondary | ICD-10-CM | POA: Insufficient documentation

## 2017-10-30 LAB — POCT GLYCOSYLATED HEMOGLOBIN (HGB A1C): HEMOGLOBIN A1C: 11.1

## 2017-10-30 LAB — GLUCOSE, POCT (MANUAL RESULT ENTRY): POC Glucose: 353 mg/dl — AB (ref 70–99)

## 2017-10-30 MED ORDER — INSULIN GLARGINE 100 UNIT/ML ~~LOC~~ SOLN: 20.0000 [IU] | Freq: Every day | SUBCUTANEOUS | 6 refills | Status: DC

## 2017-10-30 MED ORDER — LISINOPRIL 2.5 MG PO TABS: 2.5000 mg | ORAL_TABLET | Freq: Every day | ORAL | 3 refills | Status: DC

## 2017-10-30 MED ORDER — METFORMIN HCL 500 MG PO TABS: 1000.0000 mg | ORAL_TABLET | Freq: Two times a day (BID) | ORAL | 6 refills | Status: DC

## 2017-10-30 MED ORDER — AMLODIPINE BESYLATE 10 MG PO TABS: 10.0000 mg | ORAL_TABLET | Freq: Every day | ORAL | 6 refills | Status: DC

## 2017-10-30 MED ORDER — ATORVASTATIN CALCIUM 20 MG PO TABS: 20.0000 mg | ORAL_TABLET | Freq: Every day | ORAL | 6 refills | Status: DC

## 2017-10-30 MED FILL — ATORVASTATIN 20 MG TABLET: 20 | 30 days supply | Qty: 30 | Fill #0

## 2017-10-30 MED FILL — metFORMIN HCL 500 MG TABS: 500 | 15 days supply | Qty: 60 | Fill #0

## 2017-10-30 MED FILL — $LANTUS 100 UNITS/ML VIAL: 100 | 28 days supply | Qty: 10 | Fill #0

## 2017-10-30 MED FILL — AMLODIPINE BESYLATE 10 MG T: 10 | 30 days supply | Qty: 30 | Fill #0

## 2017-10-30 NOTE — Progress Notes (Signed)
Subjective:  Patient ID: Emily Hoover, female    DOB: [birth year]  Age: 72 y.o. MRN: [id number]  CC: Diabetes   HPI Emily Hoover is a 72 year old female with a history of type 2 Diabetes Mellitus (A1c 11.1), Hypertension who presents today accompanied by her daughter. Her A1c is 11.1 which is slightly down from 13.4 and she has been taking 10 units of Lantus rather than 15 units which was prescribed and has not been taking metformin either.  She has also not been compliant with a diabetic diet but walks regularly for exercise. Her blood pressure is severely elevated at 184/94 and she is not compliant with an antihypertensive as she was not aware she was on amlodipine. Her cholesterol is elevated and she has not been taking her atorvastatin either. She denies chest pain, shortness of breath and has no additional concerns today. With regards to her health care maintenance she is not up-to-date on a colonoscopy or mammogram.  Past Medical History:  Diagnosis Date  . Diabetes mellitus   . Diabetes mellitus without complication ([place])   . Hypercholesteremia   . Hypertension   . Renal insufficiency     History reviewed. No pertinent surgical history.   Outpatient Medications Prior to Visit  Medication Sig Dispense Refill  . Insulin Syringe-Needle U-100 (INSULIN SYRINGE 1CC/30GX5/16") 30G X 5/16" 1 ML MISC Check blood sugar TID & QHS 100 each 3  . insulin glargine (LANTUS) 100 UNIT/ML injection Inject 0.15 mLs (15 Units total) into the skin at bedtime. 30 mL 5  . insulin glargine (LANTUS) 100 UNIT/ML injection INJECT 15 UNITS TOTAL INTO THE SKIN AT BEDTIME. 10 mL 11  . metFORMIN (GLUCOPHAGE) 500 MG tablet Take 2 tablets (1,000 mg total) by mouth 2 (two) times daily with a meal. 60 tablet 5  . amoxicillin (AMOXIL) 500 MG capsule Take 1 capsule (500 mg total) by mouth 3 (three) times daily. (Patient not taking: Reported on 10/30/2017) 21 capsule 0  . benzonatate (TESSALON) 100 MG capsule  Take 1 capsule (100 mg total) by mouth every 8 (eight) hours. (Patient not taking: Reported on 10/30/2017) 21 capsule 0  . cephALEXin (KEFLEX) 500 MG capsule Take 1 capsule (500 mg total) by mouth 3 (three) times daily. (Patient not taking: Reported on 10/30/2017) 21 capsule 0  . doxycycline (VIBRA-TABS) 100 MG tablet Take 1 tablet (100 mg total) by mouth 2 (two) times daily. (Patient not taking: Reported on 10/30/2017) 28 tablet 0  . fluticasone (FLONASE) 50 MCG/ACT nasal spray 2 sprays each nares bid (Patient not taking: Reported on 10/30/2017) 10 g 1  . HYDROcodone-acetaminophen (NORCO/VICODIN) 5-325 MG tablet Take 1 tablet by mouth every 6 (six) hours as needed for moderate pain. Reported on 10/13/2015    . ibuprofen (ADVIL,MOTRIN) 200 MG tablet Take 200 mg by mouth every 6 (six) hours as needed for moderate pain. Reported on 10/13/2015    . meclizine (ANTIVERT) 25 MG tablet Take 1 tablet (25 mg total) by mouth 3 (three) times daily as needed for dizziness. (Patient not taking: Reported on 10/30/2017) 15 tablet 0  . traMADol (ULTRAM) 50 MG tablet Take by mouth every 6 (six) hours as needed. Reported on 10/13/2015    . amLODipine (NORVASC) 5 MG tablet Take 1 tablet (5 mg total) by mouth daily. (Patient not taking: Reported on 10/30/2017) 30 tablet 5  . atorvastatin (LIPITOR) 20 MG tablet Take 1 tablet (20 mg total) by mouth daily. (Patient not taking: Reported on 10/30/2017) 30 tablet  5   No facility-administered medications prior to visit.     ROS Review of Systems  Constitutional: Negative for activity change, appetite change and fatigue.  HENT: Negative for congestion, sinus pressure and sore throat.   Eyes: Negative for visual disturbance.  Respiratory: Negative for cough, chest tightness, shortness of breath and wheezing.   Cardiovascular: Negative for chest pain and palpitations.  Gastrointestinal: Negative for abdominal distention, abdominal pain and constipation.  Endocrine: Negative for  polydipsia.  Genitourinary: Negative for dysuria and frequency.  Musculoskeletal: Negative for arthralgias and back pain.  Skin: Negative for rash.  Neurological: Negative for tremors, light-headedness and numbness.  Hematological: Does not bruise/bleed easily.  Psychiatric/Behavioral: Negative for agitation and behavioral problems.    Objective:  BP (!) 184/94   Pulse 79   Temp 98 F (36.7 C) (Oral)   Ht 4\' 9"  (1.448 m)   Wt 98 lb 3.2 oz (44.5 kg)   SpO2 92%   BMI 21.25 kg/m   BP/Weight 10/30/2017 08/01/2017 0/17/5102  Systolic BP 585 277 824  Diastolic BP 94 86 57  Wt. (Lbs) 98.2 98.4 -  BMI 21.25 21.29 -      Physical Exam  Constitutional: She is oriented to person, place, and time. She appears well-developed and well-nourished.  Cardiovascular: Normal rate, normal heart sounds and intact distal pulses.  No murmur heard. Pulmonary/Chest: Effort normal and breath sounds normal. She has no wheezes. She has no rales. She exhibits no tenderness.  Abdominal: Soft. Bowel sounds are normal. She exhibits no distension and no mass. There is no tenderness.  Musculoskeletal: Normal range of motion.  Neurological: She is alert and oriented to person, place, and time.  Skin: Skin is warm and dry.  Psychiatric: She has a normal mood and affect.     CMP Latest Ref Rng & Units 08/02/2017 02/01/2016 10/31/2015  Glucose 65 - 99 mg/dL 261(H) 133(H) 220(H)  BUN 8 - 27 mg/dL 14 18 19   Creatinine 0.57 - 1.00 mg/dL 0.58 0.73 0.79  Sodium 134 - 144 mmol/L 139 134(L) 136  Potassium 3.5 - 5.2 mmol/L 4.0 3.2(L) 3.2(L)  Chloride 96 - 106 mmol/L 99 99(L) 99  CO2 20 - 29 mmol/L 24 24 27   Calcium 8.7 - 10.3 mg/dL 9.4 9.2 9.5  Total Protein 6.0 - 8.5 g/dL 6.9 6.9 -  Total Bilirubin 0.0 - 1.2 mg/dL 0.7 2.1(H) -  Alkaline Phos 39 - 117 IU/L 93 64 -  AST 0 - 40 IU/L 16 17 -  ALT 0 - 32 IU/L 14 12(L) -    Lipid Panel     Component Value Date/Time   CHOL 243 (H) 08/02/2017 0828   TRIG 155 (H)  08/02/2017 0828   HDL 48 08/02/2017 0828   CHOLHDL 5.1 (H) 08/02/2017 0828   LDLCALC 164 (H) 08/02/2017 0828    Lab Results  Component Value Date   HGBA1C 11.1 10/30/2017    Assessment & Plan:   1. Diabetes mellitus without complication ([place]) With Microalbuminuria Uncontrolled with A1c of 11.1 Increase dose of Lantus Advised to take metformin which she has not been doing Diabetic diet, lifestyle modifications - POCT glucose (manual entry) - POCT glycosylated hemoglobin (Hb A1C) - atorvastatin (LIPITOR) 20 MG tablet; Take 1 tablet (20 mg total) by mouth daily.  Dispense: 30 tablet; Refill: 6 - metFORMIN (GLUCOPHAGE) 500 MG tablet; Take 2 tablets (1,000 mg total) by mouth 2 (two) times daily with a meal.  Dispense: 60 tablet; Refill: 6 - insulin glargine (  LANTUS) 100 UNIT/ML injection; Inject 0.2 mLs (20 Units total) into the skin at bedtime.  Dispense: 30 mL; Refill: 6  2. Essential hypertension Uncontrolled due to not taking antihypertensive Refilled amlodipine Low sodium, DASH diet - amLODipine (NORVASC) 10 MG tablet; Take 1 tablet (10 mg total) by mouth daily.  Dispense: 30 tablet; Refill: 6  3. Screening for colon cancer - Ambulatory referral to Gastroenterology  4. Visit for screening mammogram - MM Digital Screening; Future  5. Hypercholesteremia Not compliant with medications Low-cholesterol diet Refilled atorvastatin   Meds ordered this encounter  Medications  . amLODipine (NORVASC) 10 MG tablet    Sig: Take 1 tablet (10 mg total) by mouth daily.    Dispense:  30 tablet    Refill:  6  . atorvastatin (LIPITOR) 20 MG tablet    Sig: Take 1 tablet (20 mg total) by mouth daily.    Dispense:  30 tablet    Refill:  6  . metFORMIN (GLUCOPHAGE) 500 MG tablet    Sig: Take 2 tablets (1,000 mg total) by mouth 2 (two) times daily with a meal.    Dispense:  60 tablet    Refill:  6  . insulin glargine (LANTUS) 100 UNIT/ML injection    Sig: Inject 0.2 mLs (20 Units  total) into the skin at bedtime.    Dispense:  30 mL    Refill:  6    Discontinue previous dose    Follow-up: Return in about 3 months (around 01/29/2018) for Follow-up of chronic medical conditions.   Charlott Rakes MD

## 2017-10-30 NOTE — Patient Instructions (Signed)
Diabetes Mellitus and Nutrition When you have diabetes (diabetes mellitus), it is very important to have healthy eating habits because your blood sugar (glucose) levels are greatly affected by what you eat and drink. Eating healthy foods in the appropriate amounts, at about the same times every day, can help you:  Control your blood glucose.  Lower your risk of heart disease.  Improve your blood pressure.  Reach or maintain a healthy weight.  Every person with diabetes is different, and each person has different needs for a meal plan. Your health care provider may recommend that you work with a diet and nutrition specialist (dietitian) to make a meal plan that is best for you. Your meal plan may vary depending on factors such as:  The calories you need.  The medicines you take.  Your weight.  Your blood glucose, blood pressure, and cholesterol levels.  Your activity level.  Other health conditions you have, such as heart or kidney disease.  How do carbohydrates affect me? Carbohydrates affect your blood glucose level more than any other type of food. Eating carbohydrates naturally increases the amount of glucose in your blood. Carbohydrate counting is a method for keeping track of how many carbohydrates you eat. Counting carbohydrates is important to keep your blood glucose at a healthy level, especially if you use insulin or take certain oral diabetes medicines. It is important to know how many carbohydrates you can safely have in each meal. This is different for every person. Your dietitian can help you calculate how many carbohydrates you should have at each meal and for snack. Foods that contain carbohydrates include:  Bread, cereal, rice, pasta, and crackers.  Potatoes and corn.  Peas, beans, and lentils.  Milk and yogurt.  Fruit and juice.  Desserts, such as cakes, cookies, ice cream, and candy.  How does alcohol affect me? Alcohol can cause a sudden decrease in blood  glucose (hypoglycemia), especially if you use insulin or take certain oral diabetes medicines. Hypoglycemia can be a life-threatening condition. Symptoms of hypoglycemia (sleepiness, dizziness, and confusion) are similar to symptoms of having too much alcohol. If your health care provider says that alcohol is safe for you, follow these guidelines:  Limit alcohol intake to no more than 1 drink per day for nonpregnant women and 2 drinks per day for men. One drink equals 12 oz of beer, 5 oz of wine, or 1 oz of hard liquor.  Do not drink on an empty stomach.  Keep yourself hydrated with water, diet soda, or unsweetened iced tea.  Keep in mind that regular soda, juice, and other mixers may contain a lot of sugar and must be counted as carbohydrates.  What are tips for following this plan? Reading food labels  Start by checking the serving size on the label. The amount of calories, carbohydrates, fats, and other nutrients listed on the label are based on one serving of the food. Many foods contain more than one serving per package.  Check the total grams (g) of carbohydrates in one serving. You can calculate the number of servings of carbohydrates in one serving by dividing the total carbohydrates by 15. For example, if a food has 30 g of total carbohydrates, it would be equal to 2 servings of carbohydrates.  Check the number of grams (g) of saturated and trans fats in one serving. Choose foods that have low or no amount of these fats.  Check the number of milligrams (mg) of sodium in one serving. Most people   should limit total sodium intake to less than 2,300 mg per day.  Always check the nutrition information of foods labeled as "low-fat" or "nonfat". These foods may be higher in added sugar or refined carbohydrates and should be avoided.  Talk to your dietitian to identify your daily goals for nutrients listed on the label. Shopping  Avoid buying canned, premade, or processed foods. These  foods tend to be high in fat, sodium, and added sugar.  Shop around the outside edge of the grocery store. This includes fresh fruits and vegetables, bulk grains, fresh meats, and fresh dairy. Cooking  Use low-heat cooking methods, such as baking, instead of high-heat cooking methods like deep frying.  Cook using healthy oils, such as olive, canola, or sunflower oil.  Avoid cooking with butter, cream, or high-fat meats. Meal planning  Eat meals and snacks regularly, preferably at the same times every day. Avoid going long periods of time without eating.  Eat foods high in fiber, such as fresh fruits, vegetables, beans, and whole grains. Talk to your dietitian about how many servings of carbohydrates you can eat at each meal.  Eat 4-6 ounces of lean protein each day, such as lean meat, chicken, fish, eggs, or tofu. 1 ounce is equal to 1 ounce of meat, chicken, or fish, 1 egg, or 1/4 cup of tofu.  Eat some foods each day that contain healthy fats, such as avocado, nuts, seeds, and fish. Lifestyle   Check your blood glucose regularly.  Exercise at least 30 minutes 5 or more days each week, or as told by your health care provider.  Take medicines as told by your health care provider.  Do not use any products that contain nicotine or tobacco, such as cigarettes and e-cigarettes. If you need help quitting, ask your health care provider.  Work with a counselor or diabetes educator to identify strategies to manage stress and any emotional and social challenges. What are some questions to ask my health care provider?  Do I need to meet with a diabetes educator?  Do I need to meet with a dietitian?  What number can I call if I have questions?  When are the best times to check my blood glucose? Where to find more information:  American Diabetes Association: diabetes.org/food-and-fitness/food  Academy of Nutrition and Dietetics:  www.eatright.org/resources/health/diseases-and-conditions/diabetes  National Institute of Diabetes and Digestive and Kidney Diseases (NIH): www.niddk.nih.gov/health-information/diabetes/overview/diet-eating-physical-activity Summary  A healthy meal plan will help you control your blood glucose and maintain a healthy lifestyle.  Working with a diet and nutrition specialist (dietitian) can help you make a meal plan that is best for you.  Keep in mind that carbohydrates and alcohol have immediate effects on your blood glucose levels. It is important to count carbohydrates and to use alcohol carefully. This information is not intended to replace advice given to you by your health care provider. Make sure you discuss any questions you have with your health care provider. Document Released: 03/22/2005 Document Revised: 07/30/2016 Document Reviewed: 07/30/2016 Elsevier Interactive Patient Education  2018 Elsevier Inc.  

## 2017-11-17 ENCOUNTER — Emergency Department (HOSPITAL_COMMUNITY): Payer: Self-pay

## 2017-11-17 ENCOUNTER — Observation Stay (HOSPITAL_COMMUNITY)
Admission: EM | Admit: 2017-11-17 | Discharge: 2017-11-19 | Disposition: A | Discharge status: Home / Self Care | Payer: Self-pay | Attending: Internal Medicine | Admitting: Internal Medicine

## 2017-11-17 ENCOUNTER — Encounter (HOSPITAL_COMMUNITY): Payer: Self-pay | Admitting: Emergency Medicine

## 2017-11-17 DIAGNOSIS — D164 Benign neoplasm of bones of skull and face: Secondary | ICD-10-CM | POA: Insufficient documentation

## 2017-11-17 DIAGNOSIS — E7800 Pure hypercholesterolemia, unspecified: Secondary | ICD-10-CM | POA: Insufficient documentation

## 2017-11-17 DIAGNOSIS — T68XXXA Hypothermia, initial encounter: Secondary | ICD-10-CM | POA: Insufficient documentation

## 2017-11-17 DIAGNOSIS — S0081XA Abrasion of other part of head, initial encounter: Secondary | ICD-10-CM | POA: Insufficient documentation

## 2017-11-17 DIAGNOSIS — Z794 Long term (current) use of insulin: Secondary | ICD-10-CM | POA: Insufficient documentation

## 2017-11-17 DIAGNOSIS — Z87440 Personal history of urinary (tract) infections: Secondary | ICD-10-CM | POA: Insufficient documentation

## 2017-11-17 DIAGNOSIS — G8929 Other chronic pain: Secondary | ICD-10-CM | POA: Insufficient documentation

## 2017-11-17 DIAGNOSIS — X31XXXA Exposure to excessive natural cold, initial encounter: Secondary | ICD-10-CM | POA: Insufficient documentation

## 2017-11-17 DIAGNOSIS — I959 Hypotension, unspecified: Secondary | ICD-10-CM | POA: Insufficient documentation

## 2017-11-17 DIAGNOSIS — M50222 Other cervical disc displacement at C5-C6 level: Secondary | ICD-10-CM | POA: Insufficient documentation

## 2017-11-17 DIAGNOSIS — Y9301 Activity, walking, marching and hiking: Secondary | ICD-10-CM | POA: Insufficient documentation

## 2017-11-17 DIAGNOSIS — N289 Disorder of kidney and ureter, unspecified: Secondary | ICD-10-CM | POA: Insufficient documentation

## 2017-11-17 DIAGNOSIS — E119 Type 2 diabetes mellitus without complications: Secondary | ICD-10-CM

## 2017-11-17 DIAGNOSIS — E1165 Type 2 diabetes mellitus with hyperglycemia: Secondary | ICD-10-CM | POA: Insufficient documentation

## 2017-11-17 DIAGNOSIS — Z8249 Family history of ischemic heart disease and other diseases of the circulatory system: Secondary | ICD-10-CM | POA: Insufficient documentation

## 2017-11-17 DIAGNOSIS — W010XXA Fall on same level from slipping, tripping and stumbling without subsequent striking against object, initial encounter: Secondary | ICD-10-CM | POA: Insufficient documentation

## 2017-11-17 DIAGNOSIS — E872 Acidosis: Secondary | ICD-10-CM | POA: Insufficient documentation

## 2017-11-17 DIAGNOSIS — Z79899 Other long term (current) drug therapy: Secondary | ICD-10-CM | POA: Insufficient documentation

## 2017-11-17 DIAGNOSIS — W19XXXA Unspecified fall, initial encounter: Secondary | ICD-10-CM

## 2017-11-17 DIAGNOSIS — M4050 Lordosis, unspecified, site unspecified: Secondary | ICD-10-CM | POA: Insufficient documentation

## 2017-11-17 DIAGNOSIS — I6529 Occlusion and stenosis of unspecified carotid artery: Secondary | ICD-10-CM | POA: Insufficient documentation

## 2017-11-17 DIAGNOSIS — I700 Atherosclerosis of aorta: Secondary | ICD-10-CM | POA: Insufficient documentation

## 2017-11-17 DIAGNOSIS — I10 Essential (primary) hypertension: Secondary | ICD-10-CM | POA: Insufficient documentation

## 2017-11-17 DIAGNOSIS — R55 Syncope and collapse: Principal | ICD-10-CM | POA: Insufficient documentation

## 2017-11-17 LAB — CBC
HEMATOCRIT: 36.5 % (ref 36.0–46.0)
HEMOGLOBIN: 12.6 g/dL (ref 12.0–15.0)
MCH: 30 pg (ref 26.0–34.0)
MCHC: 34.5 g/dL (ref 30.0–36.0)
MCV: 86.9 fL (ref 78.0–100.0)
Platelets: 368 10*3/uL (ref 150–400)
RBC: 4.2 MIL/uL (ref 3.87–5.11)
RDW: 13.1 % (ref 11.5–15.5)
WBC: 8 10*3/uL (ref 4.0–10.5)

## 2017-11-17 LAB — BASIC METABOLIC PANEL
ANION GAP: 11 (ref 5–15)
BUN: 20 mg/dL (ref 6–20)
CHLORIDE: 103 mmol/L (ref 101–111)
CO2: 22 mmol/L (ref 22–32)
Calcium: 9 mg/dL (ref 8.9–10.3)
Creatinine, Ser: 0.9 mg/dL (ref 0.44–1.00)
GFR calc Af Amer: >60 mL/min (ref >60)
GFR calc non Af Amer: >60 mL/min (ref >60)
GLUCOSE: 340 mg/dL — AB (ref 65–99)
POTASSIUM: 3.1 mmol/L — AB (ref 3.5–5.1)
Sodium: 136 mmol/L (ref 135–145)

## 2017-11-17 LAB — I-STAT CG4 LACTIC ACID, ED
LACTIC ACID, VENOUS: 0.95 mmol/L (ref 0.5–1.9)
Lactic Acid, Venous: 3.59 mmol/L (ref 0.5–1.9)

## 2017-11-17 LAB — I-STAT TROPONIN, ED: Troponin i, poc: 0 ng/mL (ref 0.00–0.08)

## 2017-11-17 LAB — CBG MONITORING, ED: GLUCOSE-CAPILLARY: 307 mg/dL — AB (ref 65–99)

## 2017-11-17 MED ORDER — SODIUM CHLORIDE 0.9 % IV BOLUS
1000.0000 mL | Freq: Once | INTRAVENOUS | Status: AC
  Administered 2017-11-18: 1000 mL via INTRAVENOUS

## 2017-11-17 MED ORDER — SODIUM CHLORIDE 0.9 % IV BOLUS
1000.0000 mL | Freq: Once | INTRAVENOUS | Status: AC
  Administered 2017-11-17: 1000 mL via INTRAVENOUS

## 2017-11-17 NOTE — ED Notes (Signed)
PT CBG is 333.

## 2017-11-17 NOTE — ED Provider Notes (Signed)
[facility] EMERGENCY DEPARTMENT Provider Note   CSN: [id number] Arrival date & time: 11/17/17  2119     History   Chief Complaint Chief Complaint  Patient presents with  . Hyperglycemia  . Fall  . Altered Mental Status    HPI Emily Hoover is a 72 y.o. female.  HPI   Emily Hoover is a 72yo female with a history of type 2 diabetes, hypertension, hyperlipidemia, renal insufficiency who presents to the emergency department after having a witnessed fall earlier today.  Patient reports that she was walking home from her son's house this evening when she suddenly felt lightheaded and lost consciousness, falling to the ground.  She states that a neighbor saw her and called her son who says when they brought her to the ED.  She states that she feels very cold, otherwise has no complaints.  Said that she had one episode of vomiting this morning, but this is not abnormal for her and happens occasionally.  She denies recent illness.  Denies fevers, chills, headache, visual disturbance, numbness, weakness, cough, congestion, sore throat, chest pain, shortness of breath, abdominal pain, dysuria, urinary frequency, wounds or rashes.  She denies any recent changes in her medications.  Her nursing staff, patient's blood pressure was 43P systolic on arrival.  Her son at bedside, patient was "down for about 3 minutes."  No witnessed shaking or thrashing.  He states that she might of been a little bit confused initially, but is not baseline.  Past Medical History:  Diagnosis Date  . Diabetes mellitus   . Diabetes mellitus without complication ([place])   . Hypercholesteremia   . Hypertension   . Renal insufficiency     Patient Active Problem List   Diagnosis Date Noted  . Hypercholesteremia 10/30/2017  . 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 Renal insufficiency 10/10/2015  . Staphylococcus aureus bacteremia 09/19/2015  . UTI (lower urinary tract infection) 09/16/2015  . Hip pain 09/16/2015  .  Hypertension 09/16/2015  . Pain management 09/16/2015  . Vertigo 09/07/2015  . Labyrinthitis 09/07/2015  . Neck pain 09/07/2015  . Diabetes mellitus without complication ([place]) 29/51/8841  . Labyrinthitis of right ear   . Chronic neck pain   . Uncontrollable vomiting   . Diabetes ([place]) 06/30/2013    History reviewed. No pertinent surgical history.   OB History    Gravida  0   Para  0   Term  0   Preterm  0   AB  0   Living        SAB  0   TAB  0   Ectopic  0   Multiple      Live Births               Home Medications    Prior to Admission medications   Medication Sig Start Date End Date Taking? Authorizing Provider  amLODipine (NORVASC) 10 MG tablet Take 1 tablet (10 mg total) by mouth daily. 10/30/17   Charlott Rakes, MD  amoxicillin (AMOXIL) 500 MG capsule Take 1 capsule (500 mg total) by mouth 3 (three) times daily. Patient not taking: Reported on 10/30/2017 07/31/16   Tanna Furry, MD  atorvastatin (LIPITOR) 20 MG tablet Take 1 tablet (20 mg total) by mouth daily. 10/30/17   Charlott Rakes, MD  benzonatate (TESSALON) 100 MG capsule Take 1 capsule (100 mg total) by mouth every 8 (eight) hours. Patient not taking: Reported on 10/30/2017 07/31/16   Tanna Furry, MD  cephALEXin [facility]) 500  MG capsule Take 1 capsule (500 mg total) by mouth 3 (three) times daily. Patient not taking: Reported on 10/30/2017 02/02/16   Horton, Barbette Hair, MD  doxycycline (VIBRA-TABS) 100 MG tablet Take 1 tablet (100 mg total) by mouth 2 (two) times daily. Patient not taking: Reported on 10/30/2017 10/31/15   Maren Reamer, MD  fluticasone Asencion Islam) 50 MCG/ACT nasal spray 2 sprays each nares bid Patient not taking: Reported on 10/30/2017 07/31/16   Tanna Furry, MD  HYDROcodone-acetaminophen (NORCO/VICODIN) 5-325 MG tablet Take 1 tablet by mouth every 6 (six) hours as needed for moderate pain. Reported on 10/13/2015    [provider]  ibuprofen (ADVIL,MOTRIN) 200 MG tablet Take  200 mg by mouth every 6 (six) hours as needed for moderate pain. Reported on 10/13/2015    [provider]  insulin glargine (LANTUS) 100 UNIT/ML injection Inject 0.2 mLs (20 Units total) into the skin at bedtime. 10/30/17   Charlott Rakes, MD  Insulin Syringe-Needle U-100 (INSULIN SYRINGE 1CC/30GX5/16") 30G X 5/16" 1 ML MISC Check blood sugar TID & QHS 10/10/15   Langeland, Dawn T, MD  lisinopril (PRINIVIL,ZESTRIL) 2.5 MG tablet Take 1 tablet (2.5 mg total) by mouth daily. 10/30/17   Charlott Rakes, MD  meclizine (ANTIVERT) 25 MG tablet Take 1 tablet (25 mg total) by mouth 3 (three) times daily as needed for dizziness. Patient not taking: Reported on 10/30/2017 09/07/15   Ripley Fraise, MD  metFORMIN (GLUCOPHAGE) 500 MG tablet Take 2 tablets (1,000 mg total) by mouth 2 (two) times daily with a meal. 10/30/17   Charlott Rakes, MD  traMADol (ULTRAM) 50 MG tablet Take by mouth every 6 (six) hours as needed. Reported on 10/13/2015    [provider]    Family History Family History  Family history unknown: Yes  Problem Relation Age of Onset  . Family history unknown: Yes  . CAD Mother   . CAD Father   . Hypertension Father   . Diabetes Neg Hx   . Cancer Neg Hx     Social History Social History   Tobacco Use  . Smoking status: Never Smoker  . Smokeless tobacco: Never Used  Substance Use Topics  . Alcohol use: No  . Drug use: No     Allergies   Patient has no known allergies.   Review of Systems Review of Systems  Constitutional: Positive for chills. Negative for fatigue and fever.  HENT: Negative for congestion and sore throat.   Eyes: Negative for visual disturbance.  Respiratory: Negative for cough and shortness of breath.   Cardiovascular: Negative for chest pain and leg swelling.  Gastrointestinal: Negative for abdominal pain, nausea and vomiting.  Genitourinary: Negative for difficulty urinating, dysuria and frequency.  Musculoskeletal: Negative for back  pain and neck pain.  Skin: Negative for rash and wound.  Neurological: Positive for syncope and light-headedness. Negative for weakness, numbness and headaches.     Physical Exam Updated Vital Signs BP (!) 142/65   Pulse 80   Temp (!) 94.8 F (34.9 C) (Rectal)   Resp 16   SpO2 99%   Physical Exam  Constitutional: She is oriented to person, place, and time. She appears well-developed and well-nourished. No distress.  Sitting at bedside in no apparent distress.   HENT:  Head: Normocephalic and atraumatic.  Small cut over left eyebrow. No tenderness over the face. No hemotympanum. Mucous membranes dry.   Eyes: Pupils are equal, round, and reactive to light. Conjunctivae are normal. Right eye exhibits  no discharge. Left eye exhibits no discharge.  Neck: Normal range of motion. Neck supple. No JVD present.  Cardiovascular: Normal rate, regular rhythm and intact distal pulses. Exam reveals no friction rub.  No murmur heard. Pulmonary/Chest: Effort normal and breath sounds normal. No stridor. No respiratory distress. She has no wheezes.  No anterior chest wall tenderness or bruising.   Abdominal: Soft. Bowel sounds are normal. There is no tenderness. There is no guarding.  Musculoskeletal:  No leg swelling.   Neurological: She is alert and oriented to person, place, and time. Coordination normal.  Mental Status:  Alert, oriented, thought content appropriate, able to give a coherent history. Speech fluent without evidence of aphasia. Able to follow 2 step commands without difficulty.  Motor:  Normal tone. 5/5 in upper and lower extremities bilaterally including strong and equal grip strength and dorsiflexion/plantar flexion Sensory: Light touch normal in all extremities.   Skin: Skin is warm and dry. Capillary refill takes less than 2 seconds. She is not diaphoretic.  Psychiatric: She has a normal mood and affect. Her behavior is normal.  Nursing note and vitals reviewed.    ED  Treatments / Results  Labs (all labs ordered are listed, but only abnormal results are displayed) Labs Reviewed  BASIC METABOLIC PANEL - Abnormal; Notable for the following components:      Result Value   Potassium 3.1 (*)    Glucose, Bld 340 (*)    All other components within normal limits  URINALYSIS, ROUTINE W REFLEX MICROSCOPIC - Abnormal; Notable for the following components:   Glucose, UA >=500 (*)    Protein, ur 100 (*)    All other components within normal limits  CBG MONITORING, ED - Abnormal; Notable for the following components:   Glucose-Capillary 307 (*)    All other components within normal limits  I-STAT CG4 LACTIC ACID, ED - Abnormal; Notable for the following components:   Lactic Acid, Venous 3.59 (*)    All other components within normal limits  CULTURE, BLOOD (ROUTINE X 2)  CULTURE, BLOOD (ROUTINE X 2)  CBC  I-STAT TROPONIN, ED  I-STAT CG4 LACTIC ACID, ED  I-STAT TROPONIN, ED    EKG EKG Interpretation  Date/Time:  Sunday Nov 17 2017 21:40:58 EDT Ventricular Rate:  86 PR Interval:  134 QRS Duration: 72 QT Interval:  418 QTC Calculation: 500 R Axis:   40 Text Interpretation:  Normal sinus rhythm Marked ST abnormality, possible inferior subendocardial injury Prolonged QT Abnormal ECG No significant change since last tracing Confirmed by Wandra Arthurs [phone]) on 11/17/2017 10:20:25 PM   Radiology Dg Ribs Unilateral Right  Result Date: 11/18/2017 CLINICAL DATA:  Status post fall, with right rib pain. Initial encounter. EXAM: RIGHT RIBS - 2 VIEW COMPARISON:  Chest radiograph performed 11/17/2017 FINDINGS: No displaced rib fractures are seen. The lungs are well-aerated and clear. There is no evidence of focal opacification, pleural effusion or pneumothorax. The cardiomediastinal silhouette is within normal limits. No acute osseous abnormalities are seen. IMPRESSION: No displaced rib fracture seen. No acute cardiopulmonary process identified. Electronically Signed    By: Garald Balding M.D.   On: 11/18/2017 03:25   Ct Head Wo Contrast  Result Date: 11/17/2017 CLINICAL DATA:  [place] today, LEFT temporal abrasion. Vomiting. History of hypertension, hypercholesterolemia, diabetes. EXAM: CT HEAD WITHOUT CONTRAST CT CERVICAL SPINE WITHOUT CONTRAST TECHNIQUE: Multidetector CT imaging of the head and cervical spine was performed following the standard protocol without intravenous contrast. Multiplanar CT image reconstructions of the  cervical spine were also generated. COMPARISON:  CT angio head and neck September 06, 2015 FINDINGS: CT HEAD FINDINGS BRAIN: No intraparenchymal hemorrhage, mass effect nor midline shift. The ventricles and sulci are normal for age. Patchy supratentorial white matter hypodensities less than expected for patient's age, though non-specific are most compatible with chronic small vessel ischemic disease. No acute large vascular territory infarcts. No abnormal extra-axial fluid collections. Basal cisterns are patent. VASCULAR: Mild calcific atherosclerosis of the carotid siphons. SKULL: No skull fracture. No significant scalp soft tissue swelling. SINUSES/ORBITS: Focally underpneumatized RIGHT inferior mastoid air cell. Soft tissue within RIGHT external auditory canal most compatible with cerumen. Paranasal sinus are well aerated. 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 The included ocular globes and orbital contents are non-suspicious. OTHER: None. CT CERVICAL SPINE FINDINGS ALIGNMENT: Straightened lordosis. Similar grade 1 C3-4 anterolisthesis. SKULL BASE AND VERTEBRAE: Cervical vertebral bodies and posterior elements are intact. Moderate C3-4 and mild C4-5 disc height loss compatible with degenerative discs. Moderate LEFT C3-4 facet arthropathy. C1-2 articulation maintained. 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 No destructive bony lesions. C1-2 articulation maintained. SOFT TISSUES AND SPINAL CANAL: Normal. DISC  LEVELS: No significant osseous canal stenosis. Small C3-4 through C5-6 disc protrusions. Moderate LEFT C3-4 neural foraminal narrowing. UPPER CHEST: Lung apices are clear. OTHER: None. IMPRESSION: CT HEAD: 1. Normal noncontrast CT HEAD for age. CT CERVICAL SPINE: 1. No acute fracture. Stable grade 1 C3-4 anterolisthesis on degenerative basis. 2. Moderate LEFT C3-4 neural foraminal narrowing. Electronically Signed   By: Elon Alas M.D.   On: 11/17/2017 22:53   Ct Cervical Spine Wo Contrast  Result Date: 11/17/2017 CLINICAL DATA:  [place] today, LEFT temporal abrasion. Vomiting. History of hypertension, hypercholesterolemia, diabetes. EXAM: CT HEAD WITHOUT CONTRAST CT CERVICAL SPINE WITHOUT CONTRAST TECHNIQUE: Multidetector CT imaging of the head and cervical spine was performed following the standard protocol without intravenous contrast. Multiplanar CT image reconstructions of the cervical spine were also generated. COMPARISON:  CT angio head and neck September 06, 2015 FINDINGS: CT HEAD FINDINGS BRAIN: No intraparenchymal hemorrhage, mass effect nor midline shift. The ventricles and sulci are normal for age. Patchy supratentorial white matter hypodensities less than expected for patient's age, though non-specific are most compatible with chronic small vessel ischemic disease. No acute large vascular territory infarcts. No abnormal extra-axial fluid collections. Basal cisterns are patent. VASCULAR: Mild calcific atherosclerosis of the carotid siphons. SKULL: No skull fracture. No significant scalp soft tissue swelling. SINUSES/ORBITS: Focally underpneumatized RIGHT inferior mastoid air cell. Soft tissue within RIGHT external auditory canal most compatible with cerumen. Paranasal sinus are well aerated. 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 The included ocular globes and orbital contents are non-suspicious. OTHER: None. CT CERVICAL SPINE FINDINGS ALIGNMENT: Straightened lordosis. Similar grade 1  C3-4 anterolisthesis. SKULL BASE AND VERTEBRAE: Cervical vertebral bodies and posterior elements are intact. Moderate C3-4 and mild C4-5 disc height loss compatible with degenerative discs. Moderate LEFT C3-4 facet arthropathy. C1-2 articulation maintained. 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 No destructive bony lesions. C1-2 articulation maintained. SOFT TISSUES AND SPINAL CANAL: Normal. DISC LEVELS: No significant osseous canal stenosis. Small C3-4 through C5-6 disc protrusions. Moderate LEFT C3-4 neural foraminal narrowing. UPPER CHEST: Lung apices are clear. OTHER: None. IMPRESSION: CT HEAD: 1. Normal noncontrast CT HEAD for age. CT CERVICAL SPINE: 1. No acute fracture. Stable grade 1 C3-4 anterolisthesis on degenerative basis. 2. Moderate LEFT C3-4 neural foraminal narrowing. Electronically Signed   By: Elon Alas  M.D.   On: 11/17/2017 22:53   Mr Brain Wo Contrast  Result Date: 11/18/2017 CLINICAL DATA:  Altered mental status, vomiting and hypotensive. Weakness. History of hypertension, hypercholesterolemia and diabetes. EXAM: MRI HEAD WITHOUT CONTRAST TECHNIQUE: Axial and coronal diffusion weighted imaging, sagittal T1 and axial T2 sequences. Patient did not wish to continue examination. COMPARISON:  CT HEAD Nov 17, 2017 FINDINGS: INTRACRANIAL CONTENTS: No reduced diffusion to suggest acute ischemia. No susceptibility artifact to suggest hemorrhage. The ventricles and sulci are normal for patient's age. Patchy RIGHT frontal to internal capsule white matter T2 hyperintensity compatible with chronic small vessel ischemic disease, normal for age. LEFT basal ganglia perivascular space. No suspicious parenchymal signal, masses, mass effect. No abnormal extra-axial fluid collections. No extra-axial masses. VASCULAR: Normal major intracranial vascular flow voids present at skull base. SKULL AND UPPER CERVICAL SPINE: No abnormal sellar expansion. No suspicious calvarial bone  marrow signal. Minimal grade 1 C3-4 anterolisthesis. Craniocervical junction maintained. SINUSES/ORBITS: Trace paranasal sinus mucosal thickening and trace mastoid effusions.The included ocular globes and orbital contents are non-suspicious. OTHER: None. IMPRESSION: 1. No acute intracranial process on this limited 4 sequence noncontrast MRI of the head. Electronically Signed   By: Elon Alas M.D.   On: 11/18/2017 04:45   Dg Chest Portable 1 View  Result Date: 11/18/2017 CLINICAL DATA:  Fall EXAM: PORTABLE CHEST 1 VIEW COMPARISON:  None. FINDINGS: No acute airspace disease or pleural effusion. Borderline cardiomegaly. Aortic atherosclerosis. No pneumothorax. Possible calcified right hilar lymph node. No pneumothorax. Possible acute right third and fourth rib fractures. IMPRESSION: 1. Negative for pneumothorax, pleural effusion or focal airspace disease 2. Possible acute right third and fourth rib fractures Electronically Signed   By: Donavan Foil M.D.   On: 11/18/2017 00:03    Procedures Procedures (including critical care time)  Medications Ordered in ED Medications  sodium chloride 0.9 % bolus 1,000 mL (0 mLs Intravenous Stopped 11/17/17 2236)  sodium chloride 0.9 % bolus 1,000 mL (1,000 mLs Intravenous New Bag/Given 11/18/17 0030)     Initial Impression / Assessment and Plan / ED Course  I have reviewed the triage vital signs and the nursing notes.  Pertinent labs & imaging results that were available during my care of the patient were reviewed by me and considered in my medical decision making (see chart for details).    Patient presents to the emergency department for evaluation of a witnessed syncopal episode earlier today. Reportedly hit her head with fall. On arrival she is hypotensive 64/40. She is hypothermic with rectal temp 94.6F. Denies any complaints, reports one episode of vomiting early this morning, has had this for a few weeks now. Denies headache, chest pain, shortness  of breath, abdominal pain.   Patient placed in bear hugger. Given 2L IV fluid. Initial lactic acid 3.59 which improved to .95 following fluids. Blood pressure improved with fluids 142/65. Blood cultures sent. CBG 307, no anion gap. No concern for DKA. CBC WNL, no leukocytosis. UA without evidence of infection. CT head and cervical spine without acute abnormality. Initial troponin negative, EKG without acute ischemic changes. CXR reveals probable right third and fourth rib fractures, although no tenderness on exam. No pneumothorax.   Unclear etiology of patient's syncopal episode today. She has no complaints. Temperature improved with Bair hugger. Feel that she should stay in the hospital for close observation given initial hypotension and hypothermia.   Patient will be admitted to Hospitalist service. Discussed this patient with Dr. Stark Jock who agrees with above plan  and admission.    Final Clinical Impressions(s) / ED Diagnoses   Final diagnoses:  None    ED Discharge Orders    None       Bernarda Caffey 11/18/17 2058    Veryl Speak, MD 11/18/17 2300

## 2017-11-17 NOTE — ED Triage Notes (Signed)
Patient presents to ED with son who states a neighbor told him he saw his mother fall and she was stumbling around.  Patient presents altered, several episodes of emesis on her shirt and towel, diaphoretic, BP is 27'N systolic.  Abrasion to left forehead noted.  Patient answers questions when asked, but she does not open her eyes, and she is weak.  Pt speaks spanish.  Son denies recent symptoms

## 2017-11-17 NOTE — ED Notes (Addendum)
PA at bedside talking with pt with interpreter. Rectal temp 94.8, bear hugger in place.

## 2017-11-18 ENCOUNTER — Observation Stay (HOSPITAL_BASED_OUTPATIENT_CLINIC_OR_DEPARTMENT_OTHER): Payer: Self-pay

## 2017-11-18 ENCOUNTER — Other Ambulatory Visit: Payer: Self-pay

## 2017-11-18 ENCOUNTER — Encounter (HOSPITAL_COMMUNITY): Payer: Self-pay | Admitting: Internal Medicine

## 2017-11-18 ENCOUNTER — Observation Stay (HOSPITAL_COMMUNITY): Payer: Self-pay

## 2017-11-18 DIAGNOSIS — E119 Type 2 diabetes mellitus without complications: Secondary | ICD-10-CM

## 2017-11-18 DIAGNOSIS — I361 Nonrheumatic tricuspid (valve) insufficiency: Secondary | ICD-10-CM

## 2017-11-18 DIAGNOSIS — R55 Syncope and collapse: Secondary | ICD-10-CM | POA: Diagnosis present

## 2017-11-18 DIAGNOSIS — I340 Nonrheumatic mitral (valve) insufficiency: Secondary | ICD-10-CM

## 2017-11-18 DIAGNOSIS — T68XXXA Hypothermia, initial encounter: Secondary | ICD-10-CM

## 2017-11-18 LAB — URINALYSIS, ROUTINE W REFLEX MICROSCOPIC
BACTERIA UA: NONE SEEN
BILIRUBIN URINE: NEGATIVE
Glucose, UA: >=500 mg/dL — AB
Hgb urine dipstick: NEGATIVE
KETONES UR: NEGATIVE mg/dL
Leukocytes, UA: NEGATIVE
NITRITE: NEGATIVE
PH: 6 (ref 5.0–8.0)
Protein, ur: 100 mg/dL — AB
SPECIFIC GRAVITY, URINE: 1.011 (ref 1.005–1.030)

## 2017-11-18 LAB — CBG MONITORING, ED
GLUCOSE-CAPILLARY: 273 mg/dL — AB (ref 65–99)
Glucose-Capillary: 333 mg/dL — ABNORMAL HIGH (ref 65–99)
Glucose-Capillary: 220 mg/dL — ABNORMAL HIGH (ref 65–99)
Glucose-Capillary: 120 mg/dL — ABNORMAL HIGH (ref 65–99)
Glucose-Capillary: 183 mg/dL — ABNORMAL HIGH (ref 65–99)

## 2017-11-18 LAB — CBC WITH DIFFERENTIAL/PLATELET
Basophils Absolute: 0 K/uL (ref 0.0–0.1)
Basophils Relative: 0 %
Eosinophils Absolute: 0.1 K/uL (ref 0.0–0.7)
Eosinophils Relative: 1 %
HCT: 34.2 % — ABNORMAL LOW (ref 36.0–46.0)
Hemoglobin: 11.8 g/dL — ABNORMAL LOW (ref 12.0–15.0)
Lymphocytes Relative: 15 %
Lymphs Abs: 1.4 K/uL (ref 0.7–4.0)
MCH: 30 pg (ref 26.0–34.0)
MCHC: 34.5 g/dL (ref 30.0–36.0)
MCV: 87 fL (ref 78.0–100.0)
Monocytes Absolute: 0.5 K/uL (ref 0.1–1.0)
Monocytes Relative: 5 %
Neutro Abs: 6.9 K/uL (ref 1.7–7.7)
Neutrophils Relative %: 79 %
Platelets: 316 K/uL (ref 150–400)
RBC: 3.93 MIL/uL (ref 3.87–5.11)
RDW: 12.9 % (ref 11.5–15.5)
WBC: 8.8 K/uL (ref 4.0–10.5)

## 2017-11-18 LAB — CORTISOL
CORTISOL PLASMA: 7.9 ug/dL
Cortisol, Plasma: 15 ug/dL

## 2017-11-18 LAB — HEPATIC FUNCTION PANEL
ALBUMIN: 3.3 g/dL — AB (ref 3.5–5.0)
ALK PHOS: 66 U/L (ref 38–126)
ALT: 13 U/L — ABNORMAL LOW (ref 14–54)
AST: 18 U/L (ref 15–41)
BILIRUBIN DIRECT: 0.1 mg/dL (ref 0.1–0.5)
BILIRUBIN TOTAL: 0.9 mg/dL (ref 0.3–1.2)
Indirect Bilirubin: 0.8 mg/dL (ref 0.3–0.9)
Total Protein: 6.6 g/dL (ref 6.5–8.1)

## 2017-11-18 LAB — BASIC METABOLIC PANEL WITH GFR
Anion gap: 10 (ref 5–15)
BUN: 16 mg/dL (ref 6–20)
CO2: 23 mmol/L (ref 22–32)
Calcium: 8.4 mg/dL — ABNORMAL LOW (ref 8.9–10.3)
Chloride: 107 mmol/L (ref 101–111)
Creatinine, Ser: 0.62 mg/dL (ref 0.44–1.00)
GFR calc Af Amer: >60 mL/min
GFR calc non Af Amer: >60 mL/min
Glucose, Bld: 212 mg/dL — ABNORMAL HIGH (ref 65–99)
Potassium: 2.9 mmol/L — ABNORMAL LOW (ref 3.5–5.1)
Sodium: 140 mmol/L (ref 135–145)

## 2017-11-18 LAB — MAGNESIUM
Magnesium: 1.4 mg/dL — ABNORMAL LOW (ref 1.7–2.4)
Magnesium: 1.4 mg/dL — ABNORMAL LOW (ref 1.7–2.4)

## 2017-11-18 LAB — TROPONIN I
Troponin I: <0.03 ng/mL
Troponin I: <0.03 ng/mL
Troponin I: <0.03 ng/mL (ref <0.03)

## 2017-11-18 LAB — ECHOCARDIOGRAM COMPLETE

## 2017-11-18 LAB — LIPASE, BLOOD
LIPASE: 25 U/L (ref 11–51)
LIPASE: 22 U/L (ref 11–51)

## 2017-11-18 LAB — I-STAT TROPONIN, ED: Troponin i, poc: 0 ng/mL (ref 0.00–0.08)

## 2017-11-18 LAB — GLUCOSE, CAPILLARY: GLUCOSE-CAPILLARY: 165 mg/dL — AB (ref 65–99)

## 2017-11-18 LAB — TSH: TSH: 1.332 u[IU]/mL (ref 0.350–4.500)

## 2017-11-18 LAB — D-DIMER, QUANTITATIVE: D-Dimer, Quant: 8.83 ug{FEU}/mL — ABNORMAL HIGH (ref 0.00–0.50)

## 2017-11-18 MED ORDER — ONDANSETRON HCL 4 MG/2ML IJ SOLN
4.0000 mg | Freq: Four times a day (QID) | INTRAMUSCULAR | Status: DC | PRN
Start: 2017-11-18 — End: 2017-11-19

## 2017-11-18 MED ORDER — ACETAMINOPHEN 650 MG RE SUPP: 650.0000 mg | Freq: Four times a day (QID) | RECTAL | Status: DC | PRN

## 2017-11-18 MED ORDER — ONDANSETRON HCL 4 MG PO TABS: 4.0000 mg | ORAL_TABLET | Freq: Four times a day (QID) | ORAL | Status: DC | PRN

## 2017-11-18 MED ORDER — SODIUM CHLORIDE 0.9 % IV SOLN
INTRAVENOUS | Status: DC
  Administered 2017-11-18: 06:00:00 via INTRAVENOUS

## 2017-11-18 MED ORDER — MAGNESIUM SULFATE 2 GM/50ML IV SOLN
2.0000 g | Freq: Once | INTRAVENOUS | Status: AC
  Administered 2017-11-18: 2 g via INTRAVENOUS
  Filled 2017-11-18: qty 50

## 2017-11-18 MED ORDER — INSULIN GLARGINE 100 UNIT/ML ~~LOC~~ SOLN
10.0000 [IU] | Freq: Every day | SUBCUTANEOUS | Status: DC
Start: 2017-11-18 — End: 2017-11-19
  Administered 2017-11-18 – 2017-11-19 (×2): 10 [IU] via SUBCUTANEOUS
  Filled 2017-11-18 (×3): qty 0.1

## 2017-11-18 MED ORDER — ACETAMINOPHEN 325 MG PO TABS
650.0000 mg | ORAL_TABLET | Freq: Four times a day (QID) | ORAL | Status: DC | PRN
  Administered 2017-11-19: 650 mg via ORAL
  Filled 2017-11-18: qty 2

## 2017-11-18 MED ORDER — ENOXAPARIN SODIUM 30 MG/0.3ML ~~LOC~~ SOLN
30.0000 mg | Freq: Every day | SUBCUTANEOUS | Status: DC
  Administered 2017-11-19: 30 mg via SUBCUTANEOUS
  Filled 2017-11-18 (×2): qty 0.3

## 2017-11-18 MED ORDER — POTASSIUM CHLORIDE CRYS ER 20 MEQ PO TBCR
40.0000 meq | EXTENDED_RELEASE_TABLET | Freq: Three times a day (TID) | ORAL | Status: AC
  Administered 2017-11-18 (×3): 40 meq via ORAL
  Filled 2017-11-18 (×3): qty 2

## 2017-11-18 MED ORDER — POTASSIUM CHLORIDE CRYS ER 20 MEQ PO TBCR
20.0000 meq | EXTENDED_RELEASE_TABLET | Freq: Once | ORAL | Status: AC
  Administered 2017-11-18: 20 meq via ORAL
  Filled 2017-11-18: qty 1

## 2017-11-18 MED ORDER — HYDRALAZINE HCL 20 MG/ML IJ SOLN
10.0000 mg | INTRAMUSCULAR | Status: DC | PRN
  Administered 2017-11-18: 10 mg via INTRAVENOUS
  Filled 2017-11-18: qty 1

## 2017-11-18 MED ORDER — IOPAMIDOL (ISOVUE-370) INJECTION 76%
INTRAVENOUS | Status: AC
Start: 2017-11-18 — End: 2017-11-18
  Administered 2017-11-18: 100 mL
  Filled 2017-11-18: qty 100

## 2017-11-18 MED ORDER — INSULIN ASPART 100 UNIT/ML ~~LOC~~ SOLN
0.0000 [IU] | Freq: Three times a day (TID) | SUBCUTANEOUS | Status: DC
  Administered 2017-11-18: 5 [IU] via SUBCUTANEOUS
  Administered 2017-11-18: 2 [IU] via SUBCUTANEOUS
  Administered 2017-11-19: 1 [IU] via SUBCUTANEOUS
  Filled 2017-11-18: qty 1

## 2017-11-18 NOTE — Progress Notes (Signed)
Pt admitted to [place] room 34. A&O. Call bell within reach. Placed on stepdown monitor. All questions/concerns addressed. Will continue to monitor.

## 2017-11-18 NOTE — Progress Notes (Signed)
  Echocardiogram 2D Echocardiogram has been performed.  Jennette Dubin 11/18/2017, 9:34 AM

## 2017-11-18 NOTE — ED Notes (Signed)
Patient transported to echo ?

## 2017-11-18 NOTE — Progress Notes (Signed)
EEG complete - results pending 

## 2017-11-18 NOTE — ED Notes (Signed)
Mri stated pt was unable to tolerate being in machine, was unable to get all the scans.

## 2017-11-18 NOTE — Procedures (Signed)
HPI:  72 y/o with syncope  TECHNICAL SUMMARY:  A multichannel referential and bipolar montage EEG using the standard international 10-20 system was performed on the patient described as awake and drowsy.  The dominant background activity consists of 9 hertz activity seen most prominantly over the posterior head region.  The backgound activity is reactive to eye opening and closing procedures.  Low voltage fast (beta) activity is distributed symmetrically and maximally over the anterior head regions.  ACTIVATION:  Stepwise photic stimulation at 4-20 flashes per second was performed and did not elicit any abnormal waveforms.  Hyperventilation was not performed.  EPILEPTIFORM ACTIVITY:  There were no spikes, sharp waves or paroxysmal activity.  SLEEP:  Stage 1 sleep is noted but no stage 2 sleep  CARDIAC:  The EKG lead revealed a regular sinus rhythm.  IMPRESSION:  This is a normal EEG for the patients stated age.  There were no focal, hemispheric or lateralizing features.  No epileptiform activity was recorded.  A normal EEG does not exclude the diagnosis of a seizure disorder and if seizure remains high on the list of differential diagnosis, an ambulatory EEG may be of value.  Clinical correlation is required.

## 2017-11-18 NOTE — ED Notes (Signed)
Pt CBG 120. Notified Network engineer). Pt sitting in bed eating meal tray at this time.

## 2017-11-18 NOTE — Progress Notes (Signed)
Inpatient Diabetes Program  AACE/ADA: New Consensus Statement on Inpatient Glycemic Control (2015)  Target Ranges:  Prepandial:   less than 140 mg/dL      Peak postprandial:   less than 180 mg/dL (1-2 hours)      Critically ill patients:  140 - 180 mg/dL    Spoke with patient through [facility] interpreter Johnston Ebbs [phone]) about diabetes and home regimen for diabetes control. Patient reports that she takes Metformin twice a day and Lantus at night. Patient reports she takes theses medications consistently. Spoke with patient about her last PCP visit at the [facility] and discussed her compliance with diet. Patient reports drinking sugary drinks and eating sugary things, patient does not go into detail. Patient is tearful and promises me that she will start doing right. Discussed A1C results 11.1% this admission. Discussed glucose and A1C goals. Discussed importance of checking CBGs and maintaining good CBG control to prevent long-term and short-term complications. Explained how hyperglycemia leads to damage within blood vessels which lead to the common complications seen with uncontrolled diabetes. Stressed to the patient the importance of improving glycemic control to prevent further complications from uncontrolled diabetes. Discussed impact of nutrition, exercise, stress, sickness, and medications on diabetes control. Discussed carbohydrates, carbohydrate goals per day and meal, along with portion sizes. Patient states she reads [place]. Gave patient a meal planning guide. Patient verbalized understanding of information discussed and is thankful of information. She has no further questions at this time related to diabetes.   Thanks,  Tama Headings RN, MSN, [facility] Inpatient Diabetes Coordinator Team Pager [phone] (8a-5p)

## 2017-11-18 NOTE — ED Notes (Signed)
Patient transported to CT 

## 2017-11-18 NOTE — Progress Notes (Signed)
Emily Hoover is a 72 y.o. female with history of hypertension, diabetes mellitus type 2 who was brought to the ER after patient had a syncopal episode.  Patient states last evening after visiting his son patient was going towards her husband and she finally felt weak and things in front of her getting dark and she lost balance and fell.  She states she may have lost consciousness for a few seconds.  Did not have any incontinence of urine or bowel.  Felt very weak after the fall.  Patient had a cell phone and called her son who brought her to the ER.  Patient denies any chest pain palpitations shortness of breath abdominal pain diarrhea.  Has been having some nausea for last 2 weeks.  12/05/2017: Patient seen and examined at her bedside in the ED.  She has no new complaints.  Patient is no [place] speaker.  D-dimer elevated 8.8.  Modified Geneva score 4-intermediate risk.  Will obtain a CTA PE.  Family will be contacted for update.  Please refer to H&P dictated by Dr. Hal Hope on 11/18/2017 for further details of the assessment and plan.

## 2017-11-18 NOTE — ED Notes (Addendum)
Patient transported to X-ray, MRI.   Discontinued bare hugger.

## 2017-11-18 NOTE — H&P (Addendum)
History and Physical    Emily Hoover ZWC:[id number] DOB: 08/06/1945 DOA: 11/17/2017  PCP: Charlott Rakes, MD  Patient coming from: Home.  Patent attorney used.  Chief Complaint: Loss of consciousness.  HPI: Emily Hoover is a 72 y.o. female with history of hypertension, diabetes mellitus type 2 who was brought to the ER after patient had a syncopal episode.  Patient states last evening after visiting his son patient was going towards her husband and she finally felt weak and things in front of her getting dark and she lost balance and fell.  She states she may have lost consciousness for a few seconds.  Did not have any incontinence of urine or bowel.  Felt very weak after the fall.  Patient had a cell phone and called her son who brought her to the ER.  Patient denies any chest pain palpitations shortness of breath abdominal pain diarrhea.  Has been having some nausea for last 2 weeks.  ED Course: In the ER patient had EKG which showed borderline  prolonged QTc interval with mildly low potassium.  CT head and C-spine were done which were unremarkable.  Patient was hypothermic with temperature around 94 F and patient was hypotensive initially in the 23N systolic with lactate of 3.9 which improved with 3 L of fluid bolus.  Patient was placed on a bear hugger following which temperature improved.  On my exam patient appears nonfocal but still appears weak.  Patient admitted for further observation.  Review of Systems: As per HPI, rest all negative.   Past Medical History:  Diagnosis Date  . Diabetes mellitus   . Diabetes mellitus without complication ([place])   . Hypercholesteremia   . Hypertension   . Renal insufficiency     Past Surgical History:  Procedure Laterality Date  . TUBAL LIGATION       reports that she has never smoked. She has never used smokeless tobacco. She reports that she does not drink alcohol or use drugs.  No Known Allergies  Family History  Family  history unknown: Yes  Problem Relation Age of Onset  . Family history unknown: Yes  . CAD Mother   . CAD Father   . Hypertension Father   . Diabetes Neg Hx   . Cancer Neg Hx     Prior to Admission medications   Medication Sig Start Date End Date Taking? Authorizing Provider  amLODipine (NORVASC) 10 MG tablet Take 1 tablet (10 mg total) by mouth daily. 10/30/17   Charlott Rakes, MD  amoxicillin (AMOXIL) 500 MG capsule Take 1 capsule (500 mg total) by mouth 3 (three) times daily. Patient not taking: Reported on 10/30/2017 07/31/16   Tanna Furry, MD  atorvastatin (LIPITOR) 20 MG tablet Take 1 tablet (20 mg total) by mouth daily. 10/30/17   Charlott Rakes, MD  benzonatate (TESSALON) 100 MG capsule Take 1 capsule (100 mg total) by mouth every 8 (eight) hours. Patient not taking: Reported on 10/30/2017 07/31/16   Tanna Furry, MD  cephALEXin (KEFLEX) 500 MG capsule Take 1 capsule (500 mg total) by mouth 3 (three) times daily. Patient not taking: Reported on 10/30/2017 02/02/16   Horton, Barbette Hair, MD  doxycycline (VIBRA-TABS) 100 MG tablet Take 1 tablet (100 mg total) by mouth 2 (two) times daily. Patient not taking: Reported on 10/30/2017 10/31/15   Maren Reamer, MD  fluticasone Asencion Islam) 50 MCG/ACT nasal spray 2 sprays each nares bid Patient not taking: Reported on 10/30/2017 07/31/16   Tanna Furry, MD  HYDROcodone-acetaminophen (NORCO/VICODIN) 5-325 MG tablet Take 1 tablet by mouth every 6 (six) hours as needed for moderate pain. Reported on 10/13/2015    [provider]  ibuprofen (ADVIL,MOTRIN) 200 MG tablet Take 200 mg by mouth every 6 (six) hours as needed for moderate pain. Reported on 10/13/2015    [provider]  insulin glargine (LANTUS) 100 UNIT/ML injection Inject 0.2 mLs (20 Units total) into the skin at bedtime. 10/30/17   Charlott Rakes, MD  Insulin Syringe-Needle U-100 (INSULIN SYRINGE 1CC/30GX5/16") 30G X 5/16" 1 ML MISC Check blood sugar TID & QHS 10/10/15   Langeland,  Dawn T, MD  lisinopril (PRINIVIL,ZESTRIL) 2.5 MG tablet Take 1 tablet (2.5 mg total) by mouth daily. 10/30/17   Charlott Rakes, MD  meclizine (ANTIVERT) 25 MG tablet Take 1 tablet (25 mg total) by mouth 3 (three) times daily as needed for dizziness. Patient not taking: Reported on 10/30/2017 09/07/15   Ripley Fraise, MD  metFORMIN (GLUCOPHAGE) 500 MG tablet Take 2 tablets (1,000 mg total) by mouth 2 (two) times daily with a meal. 10/30/17   Charlott Rakes, MD  traMADol (ULTRAM) 50 MG tablet Take by mouth every 6 (six) hours as needed. Reported on 10/13/2015    [provider]    Physical Exam: Vitals:   11/18/17 0100 11/18/17 0130 11/18/17 0200 11/18/17 0230  BP: (!) 146/82 (!) 150/81 (!) 149/78 (!) 149/78  Pulse: 85 84 87 83  Resp: 15 16 17 19   Temp:      TempSrc:      SpO2: 97% 97% 97% 98%      Constitutional: Moderately built and nourished. Vitals:   11/18/17 0100 11/18/17 0130 11/18/17 0200 11/18/17 0230  BP: (!) 146/82 (!) 150/81 (!) 149/78 (!) 149/78  Pulse: 85 84 87 83  Resp: 15 16 17 19   Temp:      TempSrc:      SpO2: 97% 97% 97% 98%   Eyes: Anicteric no pallor. ENMT: No discharge from the ears eyes nose or mouth. Neck: No mass felt.  No neck rigidity. Respiratory: No rhonchi or crepitations. Cardiovascular: S1-S2 heard no murmurs appreciated. Abdomen: Soft nontender bowel sounds present. Musculoskeletal: No edema.  No joint effusion. Skin: No rash.  Skin appears warm. Neurologic: Alert awake oriented to time place and person.  Moves all extremity's 5 x 5.  No facial asymmetry tongue is benign. Psychiatric: Appears normal.  Normal affect.   Labs on Admission: I have personally reviewed following labs and imaging studies  CBC: Recent Labs  Lab 11/17/17 2130  WBC 8.0  HGB 12.6  HCT 36.5  MCV 86.9  PLT 001   Basic Metabolic Panel: Recent Labs  Lab 11/17/17 2130  NA 136  K 3.1*  CL 103  CO2 22  GLUCOSE 340*  BUN 20  CREATININE 0.90  CALCIUM  9.0   GFR: CrCl cannot be calculated (Unknown ideal weight.). Liver Function Tests: No results for input(s): AST, ALT, ALKPHOS, BILITOT, PROT, ALBUMIN in the last 168 hours. No results for input(s): LIPASE, AMYLASE in the last 168 hours. No results for input(s): AMMONIA in the last 168 hours. Coagulation Profile: No results for input(s): INR, PROTIME in the last 168 hours. Cardiac Enzymes: No results for input(s): CKTOTAL, CKMB, CKMBINDEX, TROPONINI in the last 168 hours. BNP (last 3 results) No results for input(s): PROBNP in the last 8760 hours. HbA1C: No results for input(s): HGBA1C in the last 72 hours. CBG: Recent Labs  Lab 11/17/17 2312  GLUCAP 307*  Lipid Profile: No results for input(s): CHOL, HDL, LDLCALC, TRIG, CHOLHDL, LDLDIRECT in the last 72 hours. Thyroid Function Tests: No results for input(s): TSH, T4TOTAL, FREET4, T3FREE, THYROIDAB in the last 72 hours. Anemia Panel: No results for input(s): VITAMINB12, FOLATE, FERRITIN, TIBC, IRON, RETICCTPCT in the last 72 hours. Urine analysis:    Component Value Date/Time   COLORURINE YELLOW 11/17/2017 2138   APPEARANCEUR CLEAR 11/17/2017 2138   LABSPEC 1.011 11/17/2017 2138   PHURINE 6.0 11/17/2017 2138   GLUCOSEU >=500 (A) 11/17/2017 2138   HGBUR NEGATIVE 11/17/2017 2138   BILIRUBINUR NEGATIVE 11/17/2017 2138   BILIRUBINUR neg 07/01/2014 1319   KETONESUR NEGATIVE 11/17/2017 2138   PROTEINUR 100 (A) 11/17/2017 2138   UROBILINOGEN 0.2 07/01/2014 1319   UROBILINOGEN 0.2 01/19/2011 0921   NITRITE NEGATIVE 11/17/2017 2138   LEUKOCYTESUR NEGATIVE 11/17/2017 2138   Sepsis Labs: @LABRCNTIP (procalcitonin:4,lacticidven:4) )No results found for this or any previous visit (from the past 240 hour(s)).   Radiological Exams on Admission: Ct Head Wo Contrast  Result Date: 11/17/2017 CLINICAL DATA:  [place] today, LEFT temporal abrasion. Vomiting. History of hypertension, hypercholesterolemia, diabetes. EXAM: CT HEAD WITHOUT  CONTRAST CT CERVICAL SPINE WITHOUT CONTRAST TECHNIQUE: Multidetector CT imaging of the head and cervical spine was performed following the standard protocol without intravenous contrast. Multiplanar CT image reconstructions of the cervical spine were also generated. COMPARISON:  CT angio head and neck September 06, 2015 FINDINGS: CT HEAD FINDINGS BRAIN: No intraparenchymal hemorrhage, mass effect nor midline shift. The ventricles and sulci are normal for age. Patchy supratentorial white matter hypodensities less than expected for patient's age, though non-specific are most compatible with chronic small vessel ischemic disease. No acute large vascular territory infarcts. No abnormal extra-axial fluid collections. Basal cisterns are patent. VASCULAR: Mild calcific atherosclerosis of the carotid siphons. SKULL: No skull fracture. No significant scalp soft tissue swelling. SINUSES/ORBITS: Focally underpneumatized RIGHT inferior mastoid air cell. Soft tissue within RIGHT external auditory canal most compatible with cerumen. Paranasal sinus are well aerated. 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 The included ocular globes and orbital contents are non-suspicious. OTHER: None. CT CERVICAL SPINE FINDINGS ALIGNMENT: Straightened lordosis. Similar grade 1 C3-4 anterolisthesis. SKULL BASE AND VERTEBRAE: Cervical vertebral bodies and posterior elements are intact. Moderate C3-4 and mild C4-5 disc height loss compatible with degenerative discs. Moderate LEFT C3-4 facet arthropathy. C1-2 articulation maintained. 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 No destructive bony lesions. C1-2 articulation maintained. SOFT TISSUES AND SPINAL CANAL: Normal. DISC LEVELS: No significant osseous canal stenosis. Small C3-4 through C5-6 disc protrusions. Moderate LEFT C3-4 neural foraminal narrowing. UPPER CHEST: Lung apices are clear. OTHER: None. IMPRESSION: CT HEAD: 1. Normal noncontrast CT HEAD for age.  CT CERVICAL SPINE: 1. No acute fracture. Stable grade 1 C3-4 anterolisthesis on degenerative basis. 2. Moderate LEFT C3-4 neural foraminal narrowing. Electronically Signed   By: Elon Alas M.D.   On: 11/17/2017 22:53   Ct Cervical Spine Wo Contrast  Result Date: 11/17/2017 CLINICAL DATA:  [place] today, LEFT temporal abrasion. Vomiting. History of hypertension, hypercholesterolemia, diabetes. EXAM: CT HEAD WITHOUT CONTRAST CT CERVICAL SPINE WITHOUT CONTRAST TECHNIQUE: Multidetector CT imaging of the head and cervical spine was performed following the standard protocol without intravenous contrast. Multiplanar CT image reconstructions of the cervical spine were also generated. COMPARISON:  CT angio head and neck September 06, 2015 FINDINGS: CT HEAD FINDINGS BRAIN: No intraparenchymal hemorrhage, mass effect nor midline shift. The ventricles and sulci are normal for age. Patchy supratentorial white  matter hypodensities less than expected for patient's age, though non-specific are most compatible with chronic small vessel ischemic disease. No acute large vascular territory infarcts. No abnormal extra-axial fluid collections. Basal cisterns are patent. VASCULAR: Mild calcific atherosclerosis of the carotid siphons. SKULL: No skull fracture. No significant scalp soft tissue swelling. SINUSES/ORBITS: Focally underpneumatized RIGHT inferior mastoid air cell. Soft tissue within RIGHT external auditory canal most compatible with cerumen. Paranasal sinus are well aerated. 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 The included ocular globes and orbital contents are non-suspicious. OTHER: None. CT CERVICAL SPINE FINDINGS ALIGNMENT: Straightened lordosis. Similar grade 1 C3-4 anterolisthesis. SKULL BASE AND VERTEBRAE: Cervical vertebral bodies and posterior elements are intact. Moderate C3-4 and mild C4-5 disc height loss compatible with degenerative discs. Moderate LEFT C3-4 facet arthropathy. C1-2  articulation maintained. 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 No destructive bony lesions. C1-2 articulation maintained. SOFT TISSUES AND SPINAL CANAL: Normal. DISC LEVELS: No significant osseous canal stenosis. Small C3-4 through C5-6 disc protrusions. Moderate LEFT C3-4 neural foraminal narrowing. UPPER CHEST: Lung apices are clear. OTHER: None. IMPRESSION: CT HEAD: 1. Normal noncontrast CT HEAD for age. CT CERVICAL SPINE: 1. No acute fracture. Stable grade 1 C3-4 anterolisthesis on degenerative basis. 2. Moderate LEFT C3-4 neural foraminal narrowing. Electronically Signed   By: Elon Alas M.D.   On: 11/17/2017 22:53   Dg Chest Portable 1 View  Result Date: 11/18/2017 CLINICAL DATA:  Fall EXAM: PORTABLE CHEST 1 VIEW COMPARISON:  None. FINDINGS: No acute airspace disease or pleural effusion. Borderline cardiomegaly. Aortic atherosclerosis. No pneumothorax. Possible calcified right hilar lymph node. No pneumothorax. Possible acute right third and fourth rib fractures. IMPRESSION: 1. Negative for pneumothorax, pleural effusion or focal airspace disease 2. Possible acute right third and fourth rib fractures Electronically Signed   By: Donavan Foil M.D.   On: 11/18/2017 00:03    EKG: Independently reviewed.  Normal sinus rhythm with LVH secondary repolarization change QTC 498 ms.  Assessment/Plan Principal Problem:   Syncope Active Problems:   Diabetes mellitus without complication ([facility])   Hypertension   Hypothermia    1. Syncope cause not clear.  Patient was hypotensive and lactic acidosis cleared.  And hypothermic on presentation.  Hypotension improved with fluid bolus.  Will check 2D echo cardiac markers d-dimer MRI brain EEG.  Closely monitor in telemetry.  Continue with hydration.  Hold antihypertensives  Due to hypotensive presentation.  Patient has been doing some nausea for last 2 weeks.  Abdomen appears benign.  Will check LFTs and lipase.  If any of them  are abnormal may need further imaging. 2. Hypothermia -temperatures are improving.  Will check blood cultures TSH and cortisol level.  Discontinue Bair hugger once temperature improves. 3. Diabetes mellitus type 2 uncontrolled with hyperglycemia -patient states he takes Lantus 10 units in the morning daily.  But her primary care physician as per the patient had instructed her to take 20 units which patient has not been taking.  Closely follow CBGs with sliding scale coverage and will continue Lantus for now home dose. 4. Hypertension holding antihypertensives due to hypotensive presentation.   DVT prophylaxis: Lovenox. Code Status: Full code. Family Communication: No family at the bedside. Disposition Plan: Home. Consults called: Physical therapy. Admission status: Observation.   Rise Patience MD [facility] Pager [phone].  If 7PM-7AM, please contact night-coverage [email or web address] Password [email or web address]  11/18/2017, 2:45 AM

## 2017-11-18 NOTE — ED Notes (Signed)
EEG at bedside.

## 2017-11-18 NOTE — Progress Notes (Signed)
PT Cancellation Note  Patient Details Name: Adasha Boehme MRN: [id number] DOB: [birth year]   Cancelled Treatment:    Reason Eval/Treat Not Completed: Patient at procedure or test/unavailable Pt currently at CT. Will follow up as schedule allows.   Leighton Ruff, PT, DPT  Acute Rehabilitation Services  Pager: [phone]  Rudean Hitt 11/18/2017, 1:00 PM

## 2017-11-19 LAB — BLOOD CULTURE ID PANEL (REFLEXED)
Acinetobacter baumannii: NOT DETECTED
CANDIDA GLABRATA: NOT DETECTED
Candida albicans: NOT DETECTED
Candida krusei: NOT DETECTED
Candida parapsilosis: NOT DETECTED
Candida tropicalis: NOT DETECTED
ENTEROBACTER CLOACAE COMPLEX: NOT DETECTED
ENTEROBACTERIACEAE SPECIES: NOT DETECTED
Enterococcus species: NOT DETECTED
Escherichia coli: NOT DETECTED
HAEMOPHILUS INFLUENZAE: NOT DETECTED
Klebsiella oxytoca: NOT DETECTED
Klebsiella pneumoniae: NOT DETECTED
Listeria monocytogenes: NOT DETECTED
NEISSERIA MENINGITIDIS: NOT DETECTED
PROTEUS SPECIES: NOT DETECTED
Pseudomonas aeruginosa: NOT DETECTED
STAPHYLOCOCCUS SPECIES: NOT DETECTED
STREPTOCOCCUS AGALACTIAE: NOT DETECTED
STREPTOCOCCUS PNEUMONIAE: NOT DETECTED
STREPTOCOCCUS SPECIES: NOT DETECTED
Serratia marcescens: NOT DETECTED
Staphylococcus aureus (BCID): NOT DETECTED
Streptococcus pyogenes: NOT DETECTED

## 2017-11-19 LAB — MRSA PCR SCREENING: MRSA by PCR: NEGATIVE

## 2017-11-19 LAB — GLUCOSE, CAPILLARY
GLUCOSE-CAPILLARY: 129 mg/dL — AB (ref 65–99)
GLUCOSE-CAPILLARY: 147 mg/dL — AB (ref 65–99)
Glucose-Capillary: 133 mg/dL — ABNORMAL HIGH (ref 65–99)

## 2017-11-19 MED ORDER — AMLODIPINE BESYLATE 5 MG PO TABS: 5.0000 mg | ORAL_TABLET | Freq: Every day | ORAL | 0 refills | Status: DC

## 2017-11-19 MED FILL — AMLODIPINE BESYLATE 5 MG TA: 5 | 30 days supply | Qty: 30 | Fill #0

## 2017-11-19 NOTE — Progress Notes (Signed)
Pt being discharged home via wheelchair with family. Pt alert and oriented x4. VSS. Pt c/o no pain at this time. No signs of respiratory distress. Education complete and care plans resolved. IV removed with catheter intact and pt tolerated well. No further issues at this time. Pt to follow up with PCP. Reis Goga R, RN 

## 2017-11-19 NOTE — Discharge Summary (Signed)
[facility] Discharge Summary   Patient: Emily Hoover XHB:[id number]   PCP: Emily Rakes, MD DOB: 15-Mar-1946   Date of admission: 11/17/2017   Date of discharge:  11/19/2017    Discharge Diagnoses:  Principal Problem:   Syncope Active Problems:   Diabetes mellitus without complication ([place])   Hypertension   Hypothermia   Admitted From: home Disposition:  home  Recommendations for Outpatient Follow-up:  1. Follow-up with PCP in 1 week.  Follow-up Information    Emily Rakes, MD. Schedule an appointment as soon as possible for a visit in 1 week(s).   Specialty:  Family Medicine Contact information: [place] [address] [phone]          Diet recommendation: Cardiac diet  Activity: The patient is advised to gradually reintroduce usual activities.  Discharge Condition: good  Code Status: Full code  History of present illness: As per the H and P dictated on admission, "Emily Hoover is a 72 y.o. female with history of hypertension, diabetes mellitus type 2 who was brought to the ER after patient had a syncopal episode.  Patient states last evening after visiting his son patient was going towards her husband and she finally felt weak and things in front of her getting dark and she lost balance and fell.  She states she may have lost consciousness for a few seconds.  Did not have any incontinence of urine or bowel.  Felt very weak after the fall.  Patient had a cell phone and called her son who brought her to the ER.  Patient denies any chest pain palpitations shortness of breath abdominal pain diarrhea.  Has been having some nausea for last 2 weeks.  ED Course: In the ER patient had EKG which showed borderline  prolonged QTc interval with mildly low potassium.  CT head and C-spine were done which were unremarkable.  Patient was hypothermic with temperature around 94 F and patient was hypotensive initially in the 82U systolic with lactate of  3.9 which improved with 3 L of fluid bolus.  Patient was placed on a bear hugger following which temperature improved.  On my exam patient appears nonfocal but still appears weak.  Patient admitted for further observation."  Hospital Course:  Patient does not speak [place], interpreter Spanish translator was used for communication. Summary of her active problems in the hospital is as following. 1. Syncope likely orthostatic in the setting of vomiting and using home regimen of antihypertensive medications. Patient was hypotensive and lactic acidosis, cleared with IV fluids. Echocardiogram shows preserved EF, no valvular abnormalities.  Diastolic dysfunction. MRI brain negative for any acute stroke or any other acute abnormality. CT head and CT C-spine negative for any acute fracture or any other acute abnormality. Telemetry negative for any significant arrhythmia as well. EEG performed was negative for any's active seizure. D-dimer was checked, it was elevated, patient underwent CT chest PE protocol which was negative for pulmonary embolism or any other acute abnormalities. Suspect that the patient had an episode of vomiting prior to admission which might have led to dehydration which might have led to relative hypotension and on top of this the patient was taking her antihypertensive medication which might of caused syncope.  2. Hypothermia Resolved  TSH and cortisol level unremarkable. Patient was on bear hugger, discontinued early in the stay.  3. Diabetes mellitus type 2 uncontrolled with hyperglycemia patient states he takes Lantus 10 units in the morning daily. But her primary care physician as per  the patient had instructed her to take 20 units which patient has not been taking. Discharging home on home regimen.  4. Hypertension Presented with relative hypotension. Received IV fluids. Her blood pressure medications were on hold in the hospital. Will discharge home with lower-dose  amlodipine.    All other chronic medical condition were stable during the hospitalization.  Patient was seen by physical therapy, who recommended no PT follow up needed  On the day of the discharge the patient's vitals were stable , and no other acute medical condition were reported by patient. the patient was felt safe to be discharge at home with family.  Consultants: none Procedures: EEG, echocardiogram  DISCHARGE MEDICATION: Allergies as of 11/19/2017   No Known Allergies     Medication List    TAKE these medications   amLODipine 5 MG tablet Commonly known as:  NORVASC Take 1 tablet (5 mg total) by mouth daily. What changed:    medication strength  how much to take   atorvastatin 20 MG tablet Commonly known as:  LIPITOR Take 1 tablet (20 mg total) by mouth daily.   insulin glargine 100 UNIT/ML injection Commonly known as:  LANTUS Inject 0.2 mLs (20 Units total) into the skin at bedtime.   INSULIN SYRINGE 1CC/30GX5/16" 30G X 5/16" 1 ML Misc Check blood sugar TID & QHS   metFORMIN 500 MG tablet Commonly known as:  GLUCOPHAGE Take 2 tablets (1,000 mg total) by mouth 2 (two) times daily with a meal.      No Known Allergies Discharge Instructions    Diet - low sodium heart healthy   Complete by:  As directed    Discharge instructions   Complete by:  As directed    It is important that you read following instructions as well as go over your medication list with RN to help you understand your care after this hospitalization.  Discharge Instructions: Please follow-up with PCP in one week  Please request your primary care physician to go over all Hospital Tests and Procedure/Radiological results at the follow up,  Please get all Hospital records sent to your PCP by signing hospital release before you go home.   Do not drive, operating heavy machinery, perform activities at heights, swimming or participation in water activities or provide baby sitting services; until  you have been seen by Primary Care Physician or a Neurologist and advised to do so again. Do not take more than prescribed Pain, Sleep and Anxiety Medications. You were cared for by a hospitalist during your hospital stay. If you have any questions about your discharge medications or the care you received while you were in the hospital after you are discharged, you can call the unit and ask to speak with the hospitalist on call if the hospitalist that took care of you is not available.  Once you are discharged, your primary care physician will handle any further medical issues. Please note that NO REFILLS for any discharge medications will be authorized once you are discharged, as it is imperative that you return to your primary care physician (or establish a relationship with a primary care physician if you do not have one) for your aftercare needs so that they can reassess your need for medications and monitor your lab values. You Must read complete instructions/literature along with all the possible adverse reactions/side effects for all the Medicines you take and that have been prescribed to you. Take any new Medicines after you have completely understood and accept all  the possible adverse reactions/side effects. Wear Seat belts while driving. If you have smoked or chewed Tobacco in the last 2 yrs please stop smoking and/or stop any Recreational drug use.   Increase activity slowly   Complete by:  As directed      Discharge Exam: Filed Weights   11/18/17 1813 11/19/17 0500  Weight: 44.6 kg (98 lb 5.2 oz) 45.7 kg (100 lb 12 oz)   Vitals:   11/19/17 0754 11/19/17 1216  BP:    Pulse:    Resp:    Temp: 98 F (36.7 C) 98.2 F (36.8 C)  SpO2:     General: Appear in no distress, no Rash; Oral Mucosa moist. Cardiovascular: S1 and S2 Present, no Murmur, no JVD Respiratory: Bilateral Air entry present and Clear to Auscultation, no Crackles, no wheezes Abdomen: Bowel Sound present, Soft and  no tenderness Extremities: no Pedal edema, no calf tenderness Neurology: Grossly no focal neuro deficit.  Bilateral equal motor strength, normal finger-nose-finger.  The results of significant diagnostics from this hospitalization (including imaging, microbiology, ancillary and laboratory) are listed below for reference.    Significant Diagnostic Studies: Dg Ribs Unilateral Right  Result Date: 11/18/2017 CLINICAL DATA:  Status post fall, with right rib pain. Initial encounter. EXAM: RIGHT RIBS - 2 VIEW COMPARISON:  Chest radiograph performed 11/17/2017 FINDINGS: No displaced rib fractures are seen. The lungs are well-aerated and clear. There is no evidence of focal opacification, pleural effusion or pneumothorax. The cardiomediastinal silhouette is within normal limits. No acute osseous abnormalities are seen. IMPRESSION: No displaced rib fracture seen. No acute cardiopulmonary process identified. Electronically Signed   By: Garald Balding M.D.   On: 11/18/2017 03:25   Ct Head Wo Contrast  Result Date: 11/17/2017 CLINICAL DATA:  [place] today, LEFT temporal abrasion. Vomiting. History of hypertension, hypercholesterolemia, diabetes. EXAM: CT HEAD WITHOUT CONTRAST CT CERVICAL SPINE WITHOUT CONTRAST TECHNIQUE: Multidetector CT imaging of the head and cervical spine was performed following the standard protocol without intravenous contrast. Multiplanar CT image reconstructions of the cervical spine were also generated. COMPARISON:  CT angio head and neck September 06, 2015 FINDINGS: CT HEAD FINDINGS BRAIN: No intraparenchymal hemorrhage, mass effect nor midline shift. The ventricles and sulci are normal for age. Patchy supratentorial white matter hypodensities less than expected for patient's age, though non-specific are most compatible with chronic small vessel ischemic disease. No acute large vascular territory infarcts. No abnormal extra-axial fluid collections. Basal cisterns are patent. VASCULAR: Mild  calcific atherosclerosis of the carotid siphons. SKULL: No skull fracture. No significant scalp soft tissue swelling. SINUSES/ORBITS: Focally underpneumatized RIGHT inferior mastoid air cell. Soft tissue within RIGHT external auditory canal most compatible with cerumen. Paranasal sinus are well aerated. 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 The included ocular globes and orbital contents are non-suspicious. OTHER: None. CT CERVICAL SPINE FINDINGS ALIGNMENT: Straightened lordosis. Similar grade 1 C3-4 anterolisthesis. SKULL BASE AND VERTEBRAE: Cervical vertebral bodies and posterior elements are intact. Moderate C3-4 and mild C4-5 disc height loss compatible with degenerative discs. Moderate LEFT C3-4 facet arthropathy. C1-2 articulation maintained. 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 No destructive bony lesions. C1-2 articulation maintained. SOFT TISSUES AND SPINAL CANAL: Normal. DISC LEVELS: No significant osseous canal stenosis. Small C3-4 through C5-6 disc protrusions. Moderate LEFT C3-4 neural foraminal narrowing. UPPER CHEST: Lung apices are clear. OTHER: None. IMPRESSION: CT HEAD: 1. Normal noncontrast CT HEAD for age. CT CERVICAL SPINE: 1. No acute fracture. Stable grade 1 C3-4 anterolisthesis on degenerative  basis. 2. Moderate LEFT C3-4 neural foraminal narrowing. Electronically Signed   By: Elon Alas M.D.   On: 11/17/2017 22:53   Ct Angio Chest Pe W Or Wo Contrast  Result Date: 11/18/2017 CLINICAL DATA:  Intermediate probability for pulmonary embolism. Chest pain EXAM: CT ANGIOGRAPHY CHEST WITH CONTRAST TECHNIQUE: Multidetector CT imaging of the chest was performed using the standard protocol during bolus administration of intravenous contrast. Multiplanar CT image reconstructions and MIPs were obtained to evaluate the vascular anatomy. CONTRAST:  1101mL ISOVUE-370 IOPAMIDOL (ISOVUE-370) INJECTION 76% COMPARISON:  12/09/2006 FINDINGS: Cardiovascular:  Satisfactory opacification of the pulmonary arteries to the segmental level. No evidence of pulmonary embolism. Normal heart size. No pericardial effusion. Limited opacification of the left heart and beyond due to contrast timing. Aortic and coronary atherosclerotic calcification. Mediastinum/Nodes: Negative for adenopathy or mass. Lungs/Pleura: There is no edema, consolidation, effusion, or pneumothorax. Upper Abdomen: Negative Musculoskeletal: Negative Review of the MIP images confirms the above findings. IMPRESSION: Negative for pulmonary embolism or other acute finding. Aortic Atherosclerosis (ICD10-I70.0). Electronically Signed   By: Monte Fantasia M.D.   On: 11/18/2017 13:15   Ct Cervical Spine Wo Contrast  Result Date: 11/17/2017 CLINICAL DATA:  [place] today, LEFT temporal abrasion. Vomiting. History of hypertension, hypercholesterolemia, diabetes. EXAM: CT HEAD WITHOUT CONTRAST CT CERVICAL SPINE WITHOUT CONTRAST TECHNIQUE: Multidetector CT imaging of the head and cervical spine was performed following the standard protocol without intravenous contrast. Multiplanar CT image reconstructions of the cervical spine were also generated. COMPARISON:  CT angio head and neck September 06, 2015 FINDINGS: CT HEAD FINDINGS BRAIN: No intraparenchymal hemorrhage, mass effect nor midline shift. The ventricles and sulci are normal for age. Patchy supratentorial white matter hypodensities less than expected for patient's age, though non-specific are most compatible with chronic small vessel ischemic disease. No acute large vascular territory infarcts. No abnormal extra-axial fluid collections. Basal cisterns are patent. VASCULAR: Mild calcific atherosclerosis of the carotid siphons. SKULL: No skull fracture. No significant scalp soft tissue swelling. SINUSES/ORBITS: Focally underpneumatized RIGHT inferior mastoid air cell. Soft tissue within RIGHT external auditory canal most compatible with cerumen. Paranasal sinus are  well aerated. 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 The included ocular globes and orbital contents are non-suspicious. OTHER: None. CT CERVICAL SPINE FINDINGS ALIGNMENT: Straightened lordosis. Similar grade 1 C3-4 anterolisthesis. SKULL BASE AND VERTEBRAE: Cervical vertebral bodies and posterior elements are intact. Moderate C3-4 and mild C4-5 disc height loss compatible with degenerative discs. Moderate LEFT C3-4 facet arthropathy. C1-2 articulation maintained. 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 No destructive bony lesions. C1-2 articulation maintained. SOFT TISSUES AND SPINAL CANAL: Normal. DISC LEVELS: No significant osseous canal stenosis. Small C3-4 through C5-6 disc protrusions. Moderate LEFT C3-4 neural foraminal narrowing. UPPER CHEST: Lung apices are clear. OTHER: None. IMPRESSION: CT HEAD: 1. Normal noncontrast CT HEAD for age. CT CERVICAL SPINE: 1. No acute fracture. Stable grade 1 C3-4 anterolisthesis on degenerative basis. 2. Moderate LEFT C3-4 neural foraminal narrowing. Electronically Signed   By: Elon Alas M.D.   On: 11/17/2017 22:53   Mr Brain Wo Contrast  Result Date: 11/18/2017 CLINICAL DATA:  Altered mental status, vomiting and hypotensive. Weakness. History of hypertension, hypercholesterolemia and diabetes. EXAM: MRI HEAD WITHOUT CONTRAST TECHNIQUE: Axial and coronal diffusion weighted imaging, sagittal T1 and axial T2 sequences. Patient did not wish to continue examination. COMPARISON:  CT HEAD Nov 17, 2017 FINDINGS: INTRACRANIAL CONTENTS: No reduced diffusion to suggest acute ischemia. No susceptibility artifact to suggest hemorrhage. The ventricles  and sulci are normal for patient's age. Patchy RIGHT frontal to internal capsule white matter T2 hyperintensity compatible with chronic small vessel ischemic disease, normal for age. LEFT basal ganglia perivascular space. No suspicious parenchymal signal, masses, mass effect. No abnormal  extra-axial fluid collections. No extra-axial masses. VASCULAR: Normal major intracranial vascular flow voids present at skull base. SKULL AND UPPER CERVICAL SPINE: No abnormal sellar expansion. No suspicious calvarial bone marrow signal. Minimal grade 1 C3-4 anterolisthesis. Craniocervical junction maintained. SINUSES/ORBITS: Trace paranasal sinus mucosal thickening and trace mastoid effusions.The included ocular globes and orbital contents are non-suspicious. OTHER: None. IMPRESSION: 1. No acute intracranial process on this limited 4 sequence noncontrast MRI of the head. Electronically Signed   By: Elon Alas M.D.   On: 11/18/2017 04:45   Dg Chest Portable 1 View  Result Date: 11/18/2017 CLINICAL DATA:  Fall EXAM: PORTABLE CHEST 1 VIEW COMPARISON:  None. FINDINGS: No acute airspace disease or pleural effusion. Borderline cardiomegaly. Aortic atherosclerosis. No pneumothorax. Possible calcified right hilar lymph node. No pneumothorax. Possible acute right third and fourth rib fractures. IMPRESSION: 1. Negative for pneumothorax, pleural effusion or focal airspace disease 2. Possible acute right third and fourth rib fractures Electronically Signed   By: Donavan Foil M.D.   On: 11/18/2017 00:03    Microbiology: Recent Results (from the past 240 hour(s))  Blood culture (routine x 2)     Status: None (Preliminary result)   Collection Time: 11/17/17 10:20 PM  Result Value Ref Range Status   Specimen Description BLOOD RIGHT HAND  Final   Special Requests   Final    BOTTLES DRAWN AEROBIC AND ANAEROBIC Blood Culture adequate volume   Culture   Final    NO GROWTH 1 DAY Performed at [facility], [address], [place], [place] [address]    Report Status PENDING  Incomplete  Blood culture (routine x 2)     Status: None (Preliminary result)   Collection Time: 11/17/17 10:35 PM  Result Value Ref Range Status   Specimen Description BLOOD LEFT WRIST  Final   Special Requests   Final     BOTTLES DRAWN AEROBIC AND ANAEROBIC Blood Culture adequate volume   Culture   Final    NO GROWTH 1 DAY Performed at [facility], [place] [address] [place], [place] [address]    Report Status PENDING  Incomplete  MRSA PCR Screening     Status: None   Collection Time: 11/19/17  5:16 AM  Result Value Ref Range Status   MRSA by PCR NEGATIVE NEGATIVE Final    Comment:        The GeneXpert MRSA Assay (FDA approved for NASAL specimens only), is one component of a comprehensive MRSA colonization surveillance program. It is not intended to diagnose MRSA infection nor to guide or monitor treatment for MRSA infections. Performed at [facility], [place] [address] [place], [place] [address]      Labs: CBC: Recent Labs  Lab 11/17/17 2130 11/18/17 0455  WBC 8.0 8.8  NEUTROABS  --  6.9  HGB 12.6 11.8*  HCT 36.5 34.2*  MCV 86.9 87.0  PLT 368 425   Basic Metabolic Panel: Recent Labs  Lab 11/17/17 2130 11/18/17 0455 11/18/17 1056  NA 136 140  --   K 3.1* 2.9*  --   CL 103 107  --   CO2 22 23  --   GLUCOSE 340* 212*  --   BUN 20 16  --  CREATININE 0.90 0.62  --   CALCIUM 9.0 8.4*  --   MG  --  1.4* 1.4*   Liver Function Tests: Recent Labs  Lab 11/18/17 0455  AST 18  ALT 13*  ALKPHOS 66  BILITOT 0.9  PROT 6.6  ALBUMIN 3.3*   Recent Labs  Lab 11/18/17 0455 11/18/17 1056  LIPASE 25 22   No results for input(s): AMMONIA in the last 168 hours. Cardiac Enzymes: Recent Labs  Lab 11/18/17 0455 11/18/17 1056 11/18/17 1645  TROPONINI <0.03 <0.03 <0.03   BNP (last 3 results) No results for input(s): BNP in the last 8760 hours. CBG: Recent Labs  Lab 11/18/17 1307 11/18/17 1717 11/18/17 2228 11/19/17 0754 11/19/17 1216  GLUCAP 120* 183* 165* 133* 129*   Time spent: 35 minutes  Signed:  Berle Mull  [facility]  11/19/2017  , 3:22 PM

## 2017-11-19 NOTE — Progress Notes (Signed)
Admission, assessment, daily care completed with assistance of [employer] interpreter--Jose #[id number]. Pt educated to notify staff if interpreter needed to communicate with staff.

## 2017-11-19 NOTE — Progress Notes (Signed)
Patient was with Physical Therapy.  Left AD materials (Spanish) in room for patient with directions to request chaplain if she wants to complete ths while here in [place], [place]   11/19/17 1100  Clinical Encounter Type  Visited With Patient not available  Visit Type Other (Comment) (left AD material in Spanish)  Referral From Physician  Consult/Referral To Chaplain  Spiritual Encounters  Spiritual Needs Literature  Stress Factors  Patient Stress Factors None identified  Family Stress Factors None identified

## 2017-11-19 NOTE — Evaluation (Signed)
Physical Therapy Evaluation Patient Details Name: Emily Hoover MRN: [id number] DOB: March 08, 1946 Today's Date: 11/19/2017   History of Present Illness  Emily Hoover is a 72 y.o. female with history of hypertension, diabetes mellitus type 2 who was brought to the ER after patient had a syncopal episode.  Patient states last evening after visiting his son patient was going towards her husband and she finally felt weak and things in front of her getting dark and she lost balance and fell.   Clinical Impression  Patient presents with mobility close to baseline.  Currently no skilled PT needs.  Did educate to continue ambulation only when accompanied as she reports previously walking wherever she goes (over long distances outside to get to family's homes and to go to store, etc).  She verbalized understanding.  Orthostatics taken as below.  Orthostatic VS for the past 24 hrs (Last 3 readings):  BP- Lying Pulse- Lying BP- Sitting Pulse- Sitting BP- Standing at 0 minutes Pulse- Standing at 0 minutes  11/19/17 1130 151/88 87 134/78 90 146/81 97      Follow Up Recommendations No PT follow up    Equipment Recommendations  None recommended by PT    Recommendations for Other Services       Precautions / Restrictions Precautions Precautions: None      Mobility  Bed Mobility Overal bed mobility: Modified Independent                Transfers Overall transfer level: Independent                  Ambulation/Gait Ambulation/Gait assistance: Independent Ambulation Distance (Feet): 200 Feet Assistive device: None Gait Pattern/deviations: Step-through pattern     General Gait Details: quick pace, looking into room and moving around obstacles without difficulty  Stairs            Wheelchair Mobility    Modified Rankin (Stroke Patients Only)       Balance Overall balance assessment: Independent                                            Pertinent Vitals/Pain Pain Assessment: No/denies pain    Home Living Family/patient expects to be discharged to:: Private residence Living Arrangements: Children Available Help at Discharge: Family;Available PRN/intermittently Type of Home: Mobile home Home Access: Stairs to enter Entrance Stairs-Rails: Right;Left;Can reach both Entrance Stairs-Number of Steps: 4 Home Layout: One level        Prior Function Level of Independence: Independent               Hand Dominance   Dominant Hand: Right    Extremity/Trunk Assessment   Upper Extremity Assessment Upper Extremity Assessment: Overall WFL for tasks assessed    Lower Extremity Assessment Lower Extremity Assessment: Overall WFL for tasks assessed       Communication   Communication: Prefers language other than English(Spanish speaking used interpreter [employer])  Cognition Arousal/Alertness: Awake/alert Behavior During Therapy: WFL for tasks assessed/performed Overall Cognitive Status: Within Functional Limits for tasks assessed                                        General Comments General comments (skin integrity, edema, etc.): able to pick up item from floor, turn in  circle, closed eyes 10 sec, balances SLS briefly without LOB    Exercises     Assessment/Plan    PT Assessment Patent does not need any further PT services  PT Problem List         PT Treatment Interventions      PT Goals (Current goals can be found in the Care Plan section)  Acute Rehab PT Goals PT Goal Formulation: All assessment and education complete, DC therapy    Frequency     Barriers to discharge        Co-evaluation               AM-PAC PT "6 Clicks" Daily Activity  Outcome Measure Difficulty turning over in bed (including adjusting bedclothes, sheets and blankets)?: None Difficulty moving from lying on back to sitting on the side of the bed? :  None Difficulty sitting down on and standing up from a chair with arms (e.g., wheelchair, bedside commode, etc,.)?: None Help needed moving to and from a bed to chair (including a wheelchair)?: None Help needed walking in hospital room?: None Help needed climbing 3-5 steps with a railing? : A Little 6 Click Score: 23    End of Session Equipment Utilized During Treatment: Gait belt Activity Tolerance: Patient tolerated treatment well Patient left: in bed;with call bell/phone within reach   PT Visit Diagnosis: History of falling (Z91.81)    Time: 1100-1130 PT Time Calculation (min) (ACUTE ONLY): 30 min   Charges:   PT Evaluation $PT Eval Low Complexity: 1 Low PT Treatments $Gait Training: 8-22 mins   PT G CodesMagda Kiel, [place] [phone] 11/19/2017   Reginia Naas 11/19/2017, 1:38 PM

## 2017-11-22 LAB — CULTURE, BLOOD (ROUTINE X 2): SPECIAL REQUESTS: ADEQUATE

## 2017-11-23 LAB — CULTURE, BLOOD (ROUTINE X 2)
Culture: NO GROWTH
Special Requests: ADEQUATE

## 2017-12-03 ENCOUNTER — Encounter: Payer: Self-pay | Admitting: Family Medicine

## 2018-01-29 ENCOUNTER — Ambulatory Visit: Payer: Self-pay | Admitting: Family Medicine

## 2018-07-02 ENCOUNTER — Encounter (HOSPITAL_COMMUNITY): Payer: Self-pay

## 2018-07-02 ENCOUNTER — Emergency Department (HOSPITAL_COMMUNITY)
Admission: EM | Admit: 2018-07-02 | Discharge: 2018-07-03 | Disposition: A | Discharge status: Home / Self Care | Payer: Self-pay | Attending: Emergency Medicine | Admitting: Emergency Medicine

## 2018-07-02 DIAGNOSIS — E1165 Type 2 diabetes mellitus with hyperglycemia: Secondary | ICD-10-CM | POA: Insufficient documentation

## 2018-07-02 DIAGNOSIS — E7800 Pure hypercholesterolemia, unspecified: Secondary | ICD-10-CM | POA: Insufficient documentation

## 2018-07-02 DIAGNOSIS — Z79899 Other long term (current) drug therapy: Secondary | ICD-10-CM | POA: Insufficient documentation

## 2018-07-02 DIAGNOSIS — Z794 Long term (current) use of insulin: Secondary | ICD-10-CM | POA: Insufficient documentation

## 2018-07-02 DIAGNOSIS — I10 Essential (primary) hypertension: Secondary | ICD-10-CM | POA: Insufficient documentation

## 2018-07-02 DIAGNOSIS — S91302A Unspecified open wound, left foot, initial encounter: Secondary | ICD-10-CM

## 2018-07-02 DIAGNOSIS — R739 Hyperglycemia, unspecified: Secondary | ICD-10-CM

## 2018-07-02 DIAGNOSIS — L97529 Non-pressure chronic ulcer of other part of left foot with unspecified severity: Secondary | ICD-10-CM | POA: Insufficient documentation

## 2018-07-02 DIAGNOSIS — N3000 Acute cystitis without hematuria: Secondary | ICD-10-CM | POA: Insufficient documentation

## 2018-07-02 DIAGNOSIS — L84 Corns and callosities: Secondary | ICD-10-CM | POA: Insufficient documentation

## 2018-07-02 LAB — CBC WITH DIFFERENTIAL/PLATELET
ABS IMMATURE GRANULOCYTES: 0.02 10*3/uL (ref 0.00–0.07)
BASOS ABS: 0.1 10*3/uL (ref 0.0–0.1)
BASOS PCT: 1 %
Eosinophils Absolute: 0.2 10*3/uL (ref 0.0–0.5)
Eosinophils Relative: 3 %
HCT: 37.5 % (ref 36.0–46.0)
HEMOGLOBIN: 12.7 g/dL (ref 12.0–15.0)
IMMATURE GRANULOCYTES: 0 %
Lymphocytes Relative: 26 %
Lymphs Abs: 1.7 10*3/uL (ref 0.7–4.0)
MCH: 29.3 pg (ref 26.0–34.0)
MCHC: 33.9 g/dL (ref 30.0–36.0)
MCV: 86.6 fL (ref 80.0–100.0)
Monocytes Absolute: 0.6 10*3/uL (ref 0.1–1.0)
Monocytes Relative: 9 %
NEUTROS PCT: 61 %
NRBC: 0 % (ref 0.0–0.2)
Neutro Abs: 4 10*3/uL (ref 1.7–7.7)
PLATELETS: 396 10*3/uL (ref 150–400)
RBC: 4.33 MIL/uL (ref 3.87–5.11)
RDW: 12.4 % (ref 11.5–15.5)
WBC: 6.5 10*3/uL (ref 4.0–10.5)

## 2018-07-02 LAB — CBG MONITORING, ED: GLUCOSE-CAPILLARY: 543 mg/dL — AB (ref 70–99)

## 2018-07-02 LAB — I-STAT CG4 LACTIC ACID, ED: LACTIC ACID, VENOUS: 1.77 mmol/L (ref 0.5–1.9)

## 2018-07-02 NOTE — ED Triage Notes (Signed)
Pt here for diabetic wounds to bilateral feet bedside great toe. Left side has open wound, no drainage. Right foot has black in the middle of wound. Pt non compliant with insulin at home and non compliant with hygiene. Son reports pt will have incontinence and not want to clean herself up.

## 2018-07-03 ENCOUNTER — Telehealth: Payer: Self-pay | Admitting: Family Medicine

## 2018-07-03 ENCOUNTER — Emergency Department (HOSPITAL_COMMUNITY): Payer: Self-pay

## 2018-07-03 DIAGNOSIS — E119 Type 2 diabetes mellitus without complications: Secondary | ICD-10-CM

## 2018-07-03 LAB — URINALYSIS, ROUTINE W REFLEX MICROSCOPIC
BACTERIA UA: NONE SEEN
BILIRUBIN URINE: NEGATIVE
Glucose, UA: >=500 mg/dL — AB
HGB URINE DIPSTICK: NEGATIVE
Ketones, ur: NEGATIVE mg/dL
NITRITE: NEGATIVE
PROTEIN: 100 mg/dL — AB
SPECIFIC GRAVITY, URINE: 1.023 (ref 1.005–1.030)
pH: 7 (ref 5.0–8.0)

## 2018-07-03 LAB — COMPREHENSIVE METABOLIC PANEL
ALT: 17 U/L (ref 0–44)
ANION GAP: 12 (ref 5–15)
AST: 20 U/L (ref 15–41)
Albumin: 2.5 g/dL — ABNORMAL LOW (ref 3.5–5.0)
Alkaline Phosphatase: 112 U/L (ref 38–126)
BILIRUBIN TOTAL: 0.6 mg/dL (ref 0.3–1.2)
BUN: 9 mg/dL (ref 8–23)
CHLORIDE: 95 mmol/L — AB (ref 98–111)
CO2: 25 mmol/L (ref 22–32)
Calcium: 8.9 mg/dL (ref 8.9–10.3)
Creatinine, Ser: 0.68 mg/dL (ref 0.44–1.00)
GFR calc Af Amer: >60 mL/min (ref >60)
Glucose, Bld: 579 mg/dL (ref 70–99)
Potassium: 3 mmol/L — ABNORMAL LOW (ref 3.5–5.1)
Sodium: 132 mmol/L — ABNORMAL LOW (ref 135–145)
Total Protein: 6.8 g/dL (ref 6.5–8.1)

## 2018-07-03 LAB — CBG MONITORING, ED
GLUCOSE-CAPILLARY: 436 mg/dL — AB (ref 70–99)
Glucose-Capillary: 320 mg/dL — ABNORMAL HIGH (ref 70–99)

## 2018-07-03 LAB — I-STAT CG4 LACTIC ACID, ED
LACTIC ACID, VENOUS: 2.36 mmol/L — AB (ref 0.5–1.9)
LACTIC ACID, VENOUS: 2.08 mmol/L — AB (ref 0.5–1.9)

## 2018-07-03 MED ORDER — SODIUM CHLORIDE 0.9 % IV BOLUS
1000.0000 mL | Freq: Once | INTRAVENOUS | Status: AC
  Administered 2018-07-03: 1000 mL via INTRAVENOUS

## 2018-07-03 MED ORDER — CEPHALEXIN 250 MG PO CAPS
500.0000 mg | ORAL_CAPSULE | Freq: Once | ORAL | Status: AC
  Administered 2018-07-03: 500 mg via ORAL
  Filled 2018-07-03: qty 2

## 2018-07-03 MED ORDER — INSULIN ASPART 100 UNIT/ML ~~LOC~~ SOLN
5.0000 [IU] | Freq: Once | SUBCUTANEOUS | Status: AC
  Administered 2018-07-03: 5 [IU] via SUBCUTANEOUS

## 2018-07-03 MED ORDER — CEPHALEXIN 500 MG PO CAPS: 500.0000 mg | ORAL_CAPSULE | Freq: Three times a day (TID) | ORAL | 0 refills | Status: AC

## 2018-07-03 MED FILL — !LANTUS 100 UNITS/ML VIAL: 100 | 28 days supply | Qty: 10 | Fill #1

## 2018-07-03 MED FILL — CEPHALEXIN 500 MG CAPSULE: 500 | 5 days supply | Qty: 15 | Fill #0

## 2018-07-03 NOTE — Telephone Encounter (Signed)
1) Medication(s) Requested (by name): Insulin    2) Pharmacy of Choice: [facility] pharmacy   3) Special Requests:   Approved medications will be sent to the pharmacy, we will reach out if there is an issue.  Requests made after 3pm may not be addressed until the following business day!  If a patient is unsure of the name of the medication(s) please note and ask patient to call back when they are able to provide all info, do not send to responsible party until all information is available!

## 2018-07-03 NOTE — ED Notes (Addendum)
Pur-Wick placed per MD for "clean" sample. Patient's perineum (labia) were excoriated prior to placement - patient cleaned then applied. Patient tolerated well.

## 2018-07-03 NOTE — ED Provider Notes (Signed)
[facility] EMERGENCY DEPARTMENT Provider Note  CSN: [id number] Arrival date & time: 07/02/18 2259  Chief Complaint(s) Diabetic Ulcer  HPI Emily Hoover is a 72 y.o. female with a history of hypertension and diabetes on metformin and insulin who presents to the emergency department with left foot wound which family noted today.  They report that the patient does not typically communicate any concerns to them.  They do state that the patient is usually noncompliant with her diet and her insulin.  Have attempted to assist her but they report that she "lies" regarding when she has taken her medication.  Patient reports that she has had the wound for approximately 2 months.  States that she has not seen any significant change.  Denies any associated pain, redness or discharge.  States that she had a callus that fell off.  She denies any difficulty ambulating.  Denies any trauma.  Denies any other physical complaints.  HPI  Past Medical History Past Medical History:  Diagnosis Date  . Diabetes mellitus   . Diabetes mellitus without complication ([place])   . Hypercholesteremia   . Hypertension   . Renal insufficiency    Patient Active Problem List   Diagnosis Date Noted  . Syncope 11/18/2017  . Hypothermia 11/18/2017  . Hypercholesteremia 10/30/2017  . 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 Renal insufficiency 10/10/2015  . Staphylococcus aureus bacteremia 09/19/2015  . UTI (lower urinary tract infection) 09/16/2015  . Hip pain 09/16/2015  . Hypertension 09/16/2015  . Pain management 09/16/2015  . Vertigo 09/07/2015  . Labyrinthitis 09/07/2015  . Neck pain 09/07/2015  . Diabetes mellitus without complication ([place]) 28/63/8177  . Labyrinthitis of right ear   . Chronic neck pain   . Uncontrollable vomiting   . Diabetes ([place]) 06/30/2013   Home Medication(s) Prior to Admission medications   Medication Sig Start Date End Date Taking? Authorizing Provider  insulin glargine (LANTUS)  100 UNIT/ML injection Inject 0.2 mLs (20 Units total) into the skin at bedtime. Patient taking differently: Inject 12 Units into the skin at bedtime.  10/30/17  Yes Charlott Rakes, MD  amLODipine (NORVASC) 5 MG tablet Take 1 tablet (5 mg total) by mouth daily. Patient not taking: Reported on 07/03/2018 11/19/17   Lavina Hamman, MD  atorvastatin (LIPITOR) 20 MG tablet Take 1 tablet (20 mg total) by mouth daily. Patient not taking: Reported on 07/03/2018 10/30/17   Charlott Rakes, MD  cephALEXin (KEFLEX) 500 MG capsule Take 1 capsule (500 mg total) by mouth 3 (three) times daily for 5 days. 07/03/18 07/08/18  Fatima Blank, MD  Insulin Syringe-Needle U-100 (INSULIN SYRINGE 1CC/30GX5/16") 30G X 5/16" 1 ML MISC Check blood sugar TID & QHS 10/10/15   Langeland, Dawn T, MD  metFORMIN (GLUCOPHAGE) 500 MG tablet Take 2 tablets (1,000 mg total) by mouth 2 (two) times daily with a meal. Patient not taking: Reported on 07/03/2018 10/30/17   Charlott Rakes, MD  Past Surgical History Past Surgical History:  Procedure Laterality Date  . TUBAL LIGATION     Family History Family History  Family history unknown: Yes  Problem Relation Age of Onset  . Family history unknown: Yes  . CAD Mother   . CAD Father   . Hypertension Father   . Diabetes Neg Hx   . Cancer Neg Hx     Social History Social History   Tobacco Use  . Smoking status: Never Smoker  . Smokeless tobacco: Never Used  Substance Use Topics  . Alcohol use: No  . Drug use: No   Allergies Patient has no known allergies.  Review of Systems Review of Systems All other systems are reviewed and are negative for acute change except as noted in the HPI  Physical Exam Vital Signs  I have reviewed the triage vital signs BP (!) 177/96   Pulse 80   Temp 97.9 F (36.6 C) (Oral)   Resp 16   SpO2 97%     Physical Exam Vitals signs reviewed.  Constitutional:      General: She is not in acute distress.    Appearance: She is well-developed. She is not diaphoretic.  HENT:     Head: Normocephalic and atraumatic.     Right Ear: External ear normal.     Left Ear: External ear normal.     Nose: Nose normal.  Eyes:     General: No scleral icterus.    Conjunctiva/sclera: Conjunctivae normal.  Neck:     Musculoskeletal: Normal range of motion.     Trachea: Phonation normal.  Cardiovascular:     Rate and Rhythm: Normal rate and regular rhythm.  Pulmonary:     Effort: Pulmonary effort is normal. No respiratory distress.     Breath sounds: No stridor.  Abdominal:     General: There is no distension.  Musculoskeletal: Normal range of motion.       Feet:  Neurological:     Mental Status: She is alert and oriented to person, place, and time.  Psychiatric:        Behavior: Behavior normal.     ED Results and Treatments Labs (all labs ordered are listed, but only abnormal results are displayed) Labs Reviewed  COMPREHENSIVE METABOLIC PANEL - Abnormal; Notable for the following components:      Result Value   Sodium 132 (*)    Potassium 3.0 (*)    Chloride 95 (*)    Glucose, Bld 579 (*)    Albumin 2.5 (*)    All other components within normal limits  URINALYSIS, ROUTINE W REFLEX MICROSCOPIC - Abnormal; Notable for the following components:   Color, Urine STRAW (*)    Glucose, UA >=500 (*)    Protein, ur 100 (*)    Leukocytes, UA TRACE (*)    All other components within normal limits  CBG MONITORING, ED - Abnormal; Notable for the following components:   Glucose-Capillary 543 (*)    All other components within normal limits  I-STAT CG4 LACTIC ACID, ED - Abnormal; Notable for the following components:   Lactic Acid, Venous 2.36 (*)    All other components within normal limits  CBG MONITORING, ED - Abnormal; Notable for the following components:   Glucose-Capillary 436 (*)     All other components within normal limits  CBG MONITORING, ED - Abnormal; Notable for the following components:   Glucose-Capillary 320 (*)    All other components within normal limits  I-STAT CG4  LACTIC ACID, ED - Abnormal; Notable for the following components:   Lactic Acid, Venous 2.08 (*)    All other components within normal limits  CBC WITH DIFFERENTIAL/PLATELET  I-STAT CG4 LACTIC ACID, ED  CBG MONITORING, ED  CBG MONITORING, ED                                                                                                                         EKG  EKG Interpretation  Date/Time:    Ventricular Rate:    PR Interval:    QRS Duration:   QT Interval:    QTC Calculation:   R Axis:     Text Interpretation:        Radiology Dg Foot 2 Views Left  Result Date: 07/03/2018 CLINICAL DATA:  First toe infection EXAM: LEFT FOOT - 2 VIEW COMPARISON:  None. FINDINGS: Hallux valgus deformity is noted. Soft tissue wound is noted over the first MTP joint. No bony destructive changes are identified to suggest osteomyelitis. No other focal abnormality is noted. IMPRESSION: Soft tissue wound without definitive osteomyelitis. Electronically Signed   By: Inez Catalina M.D.   On: 07/03/2018 01:38   Pertinent labs & imaging results that were available during my care of the patient were reviewed by me and considered in my medical decision making (see chart for details).  Medications Ordered in ED Medications  sodium chloride 0.9 % bolus 1,000 mL (0 mLs Intravenous Stopped 07/03/18 0221)  insulin aspart (novoLOG) injection 5 Units (5 Units Subcutaneous Given 07/03/18 0030)  sodium chloride 0.9 % bolus 1,000 mL (0 mLs Intravenous Stopped 07/03/18 0355)  insulin aspart (novoLOG) injection 5 Units (5 Units Subcutaneous Given 07/03/18 0241)  cephALEXin (KEFLEX) capsule 500 mg (500 mg Oral Given 07/03/18 0240)                                                                                                                                     Procedures Procedures  (including critical care time)  Medical Decision Making / ED Course I have reviewed the nursing notes for this encounter and the patient's prior records (if available in EHR or on provided paperwork).    Patient is a noncompliant diabetic noted to have hyperglycemia without evidence of DKA.  Left foot wound does not appear to be infected at this time.  Plain film did not reveal evidence of osteomyelitis.  I believe  that this was likely due to poor healing wound from a previous callus fallen off.  Rest of the work-up did reveal evidence of a possible urinary tract infection.  She will be treated with Keflex.  Regarding the patient's hyperglycemia, she was provided with IV fluids and subcu insulin.  Blood sugars improved following IV fluids and insulin.  The patient appears reasonably screened and/or stabilized for discharge and I doubt any other medical condition or other [facility] requiring further screening, evaluation, or treatment in the ED at this time prior to discharge.  The patient is safe for discharge with strict return precautions.     Final Clinical Impression(s) / ED Diagnoses Final diagnoses:  Hyperglycemia  Open wound of left foot, initial encounter  Callus of foot  Acute cystitis without hematuria    Disposition: Discharge  Condition: Good  I have discussed the results, Dx and Tx plan with the patient and family who expressed understanding and agree(s) with the plan. Discharge instructions discussed at great length. The patient and family were given strict return precautions who verbalized understanding of the instructions. No further questions at time of discharge.    ED Discharge Orders         Ordered    cephALEXin (KEFLEX) 500 MG capsule  3 times daily     07/03/18 0443           Follow Up: Charlott Rakes, MD [place] [address] [phone]  Schedule an  appointment as soon as possible for a visit  in 5-7 days, For close follow up to assess for foot wound    This chart was dictated using voice recognition software.  Despite best efforts to proofread,  errors can occur which can change the documentation meaning.   Fatima Blank, MD 07/03/18 [phone]

## 2018-07-03 NOTE — Telephone Encounter (Signed)
Pt had refills left at pharmacy, it is currently in process, should be ready later today.

## 2018-07-07 ENCOUNTER — Ambulatory Visit: Payer: Self-pay | Attending: Family Medicine | Admitting: Family Medicine

## 2018-07-07 ENCOUNTER — Other Ambulatory Visit: Payer: Self-pay

## 2018-07-07 ENCOUNTER — Encounter: Payer: Self-pay | Admitting: Family Medicine

## 2018-07-07 VITALS — BP 174/85 | HR 80 | Temp 97.9°F | Ht <= 58 in | Wt 100.6 lb

## 2018-07-07 DIAGNOSIS — E876 Hypokalemia: Secondary | ICD-10-CM | POA: Insufficient documentation

## 2018-07-07 DIAGNOSIS — Z794 Long term (current) use of insulin: Secondary | ICD-10-CM | POA: Insufficient documentation

## 2018-07-07 DIAGNOSIS — I10 Essential (primary) hypertension: Secondary | ICD-10-CM | POA: Insufficient documentation

## 2018-07-07 DIAGNOSIS — E7800 Pure hypercholesterolemia, unspecified: Secondary | ICD-10-CM | POA: Insufficient documentation

## 2018-07-07 DIAGNOSIS — E119 Type 2 diabetes mellitus without complications: Secondary | ICD-10-CM | POA: Insufficient documentation

## 2018-07-07 DIAGNOSIS — Z79899 Other long term (current) drug therapy: Secondary | ICD-10-CM | POA: Insufficient documentation

## 2018-07-07 DIAGNOSIS — E11621 Type 2 diabetes mellitus with foot ulcer: Secondary | ICD-10-CM

## 2018-07-07 DIAGNOSIS — Z91199 Patient's noncompliance with other medical treatment and regimen due to unspecified reason: Secondary | ICD-10-CM

## 2018-07-07 DIAGNOSIS — E1165 Type 2 diabetes mellitus with hyperglycemia: Secondary | ICD-10-CM | POA: Insufficient documentation

## 2018-07-07 DIAGNOSIS — L97521 Non-pressure chronic ulcer of other part of left foot limited to breakdown of skin: Secondary | ICD-10-CM | POA: Insufficient documentation

## 2018-07-07 DIAGNOSIS — Z9119 Patient's noncompliance with other medical treatment and regimen: Secondary | ICD-10-CM | POA: Insufficient documentation

## 2018-07-07 DIAGNOSIS — Z9851 Tubal ligation status: Secondary | ICD-10-CM | POA: Insufficient documentation

## 2018-07-07 DIAGNOSIS — Z7984 Long term (current) use of oral hypoglycemic drugs: Secondary | ICD-10-CM | POA: Insufficient documentation

## 2018-07-07 LAB — POCT GLYCOSYLATED HEMOGLOBIN (HGB A1C): Hemoglobin A1C: 14.6 % — AB (ref 4.0–5.6)

## 2018-07-07 LAB — GLUCOSE, POCT (MANUAL RESULT ENTRY): POC Glucose: 215 mg/dl — AB (ref 70–99)

## 2018-07-07 MED ORDER — GLUCOSE BLOOD VI STRP: 1.0000 | ORAL_STRIP | Freq: Three times a day (TID) | 12 refills | Status: AC

## 2018-07-07 MED ORDER — LISINOPRIL 5 MG PO TABS: 5.0000 mg | ORAL_TABLET | Freq: Every day | ORAL | 6 refills | Status: DC

## 2018-07-07 MED ORDER — TRUEPLUS LANCETS 28G MISC: 1.0000 | Freq: Three times a day (TID) | 11 refills | Status: AC

## 2018-07-07 MED ORDER — TRUE METRIX METER DEVI: 1.0000 | Freq: Three times a day (TID) | 0 refills | Status: AC

## 2018-07-07 MED ORDER — INSULIN GLARGINE 100 UNIT/ML ~~LOC~~ SOLN: 20.0000 [IU] | Freq: Every day | SUBCUTANEOUS | 6 refills | Status: DC

## 2018-07-07 MED ORDER — METFORMIN HCL 500 MG PO TABS: 1000.0000 mg | ORAL_TABLET | Freq: Two times a day (BID) | ORAL | 6 refills | Status: DC

## 2018-07-07 MED ORDER — ATORVASTATIN CALCIUM 20 MG PO TABS: 20.0000 mg | ORAL_TABLET | Freq: Every day | ORAL | 6 refills | Status: DC

## 2018-07-07 MED ORDER — AMLODIPINE BESYLATE 10 MG PO TABS: 10.0000 mg | ORAL_TABLET | Freq: Every day | ORAL | 6 refills | Status: DC

## 2018-07-07 MED FILL — TRUE METRIX TEST STRIP: 33 days supply | Qty: 100 | Fill #0

## 2018-07-07 MED FILL — !TRUE METRIX BLOOD GLUCOSE: 30 days supply | Qty: 1 | Fill #0

## 2018-07-07 MED FILL — TRUEplus LANCETS 28G MISC: 33 days supply | Qty: 100 | Fill #0

## 2018-07-07 NOTE — Patient Instructions (Signed)
La diabetes mellitus y el cuidado de los pies  Diabetes Mellitus and Foot Care  El cuidado de los pies es un aspecto importante de la salud, especialmente si tiene diabetes. La diabetes puede generar problemas debido a que el flujo sanguíneo (circulación) es deficiente en las piernas y los pies, y esto puede hacer que la piel:  · Se torne más fina y seca.  · Se resquebraje más fácilmente.  · Cicatrice más lentamente.  · Se descame y agriete.  También pueden estar dañados los nervios (neuropatía) de las piernas y de los pies, lo que provoca una disminución de la sensibilidad. En consecuencia, es posible que no advierta heridas pequeñas en los pies que pueden causar problemas más graves. Identificar y tratar cualquier complicación lo antes posible es la mejor manera de evitar futuros problemas de pie.  Cómo cuidar los pies  Higiene de los pies  · Lávese los pies todos los días con agua tibia y un jabón suave. No use agua caliente. Luego séquese los pies y entre los dedos dando palmaditas, hasta que estén completamente secos. No remoje los pies, ya que esto puede resecar la piel.  · Córtese las uñas de los pies en línea recta. No escarbe debajo de las uñas o alrededor de las cutículas. Lime los bordes de las uñas con una lima o esmeril.  · Aplique una loción hidratante o vaselina en la piel de los pies y en las uñas secas y quebradizas. Use una loción que no contenga alcohol ni fragancias. No aplique loción entre los dedos.  Zapatos y calcetines  · Use calcetines de algodón o medias limpias todos los días. Asegúrese de que no le ajusten demasiado. No use calcetines que le lleguen a las rodillas, ya que podrían disminuir el flujo de sangre a las piernas.  · Use zapatos de cuero que le queden bien y que sean acolchados. Revise siempre los zapatos antes de ponerlos para asegurarse de que no haya objetos en su interior.  · Para amoldar los zapatos, cálcelos solo algunas horas por día. Esto evitará lesiones en los  pies.  Heridas, rasguños, durezas y callosidades  · Controle sus pies diariamente para observar si hay ampollas, cortes, moretones, llagas o enrojecimiento. Si no puede ver la planta del pie, use un espejo o pídale ayuda a otra persona.  · No corte las durezas o callosidades, ni trate de quitarlas con medicamentos.  · Si algo le ha raspado, cortado o lastimado la piel de los pies, mantenga la piel de esa zona limpia y seca. Puede higienizar estas zonas con agua y un jabón suave. No limpie la zona con agua oxigenada, alcohol ni yodo.  · Si tiene una herida, un rasguño, una dureza o una callosidad en el pie, revísela varias veces al día para asegurarse de que se esté curando y no se infecte. Esté atento a los siguientes signos:  ? Dolor, hinchazón o enrojecimiento.  ? Líquido o sangre.  ? Calor.  ? Pus o mal olor.  Instrucciones generales  · No se cruce de piernas. Esto puede disminuir el flujo de sangre a los pies.  · No use bolsas de agua caliente ni almohadillas térmicas en los pies. Podrían causar quemaduras. Si ha perdido la sensibilidad en los pies o las piernas, no sabrá lo que le está sucediendo hasta que sea demasiado tarde.  · Proteja sus pies del calor y del frío con calzado, en la playa o sobre el pavimento caliente.  · Programe una cita   para un examen completo de los pies por lo menos una vez al año (anualmente) o con más frecuencia si tiene problemas en los pies. Si tiene problemas en los pies, infórmele al médico de inmediato sobre los cortes, las llagas o los moretones.  Comuníquese con un médico si:  · Tiene una afección que aumenta su riesgo de tener infecciones y tiene cortes, llagas o moretones en los pies.  · Tiene una lesión que no se cura.  · Tiene una zona irritada en las piernas o los pies.  · Siente una sensación de ardor u hormigueo en las piernas o los pies.  · Siente dolor o calambres en las piernas o los pies.  · Las piernas o los pies están adormecidos.  · Siente los pies siempre  fríos.  · Siente dolor alrededor de una uña del pie.  Solicite ayuda de inmediato si:  · Tiene una herida, un rasguño, una dureza o una callosidad en el pie y:  ? Tiene dolor, hinchazón o enrojecimiento que empeora.  ? Le sale líquido o sangre de la herida, el rasguño, la dureza o la callosidad.  ? La herida, el rasguño, la dureza o la callosidad está caliente al tacto.  ? Le sale pus o mal olor de la herida, el rasguño, la dureza o la callosidad.  ? Tiene fiebre.  ? Tiene una línea roja que sube por la pierna.  Resumen  · Controle todos los días el estado de sus pies para observar si hay cortes, llagas, manchas rojas, hinchazón o ampollas.  · Huméctese los pies y las piernas a diario.  · Use zapatos de cuero que le queden bien y que sean acolchados.  · Si tiene problemas en los pies, infórmele al médico de inmediato sobre los cortes, las llagas o los moretones.  · Programe una cita para un examen completo de los pies por lo menos una vez al año (anualmente) o con más frecuencia si tiene problemas en los pies.  Esta información no tiene como fin reemplazar el consejo del médico. Asegúrese de hacerle al médico cualquier pregunta que tenga.  Document Released: 06/25/2005 Document Revised: 02/15/2017 Document Reviewed: 02/15/2017  [employer]vier Interactive Patient Education © 2019 [employer]

## 2018-07-07 NOTE — Progress Notes (Signed)
Subjective:  Patient ID: Emily Hoover, female    DOB: 1946-06-05  Age: 72 y.o. MRN: [id number]  CC: Hospitalization Follow-up   HPI Emily Hoover is a 72 year old female with a history of type 2 Diabetes Mellitus (A1c 14.6), Hypertension who presents today accompanied by her son for a follow-up visit. She had an ED visit 5 days ago for a left foot ulcer and hyperglycemia which was treated with IV fluids and insulin with resulting improvement in her blood sugars.  She was commenced on Keflex for treatment of her left foot ulcer.  She presents today and her blood pressure is severely elevated-she is yet to take her antihypertensive.  Her A1c is 14.6 today and she has only been taking 12 units of Lantus rather than 20 units prescribed.  She also does not check her sugars at home. Her son complains the patient has not been compliant with a diabetic diet and has been eating a lot of sweets. With regards to her foot ulcer she informs me she has no ulcer while her son insists she does have one and on taking off her shoes the left foot ulcer is visible.  Her son endorses the fact that the patient does have some memory issues. She denies drainage from ulcers, fever.   Past Medical History:  Diagnosis Date  . Diabetes mellitus   . Diabetes mellitus without complication ([place])   . Hypercholesteremia   . Hypertension   . Renal insufficiency     Past Surgical History:  Procedure Laterality Date  . TUBAL LIGATION      No Known Allergies   Outpatient Medications Prior to Visit  Medication Sig Dispense Refill  . Insulin Syringe-Needle U-100 (INSULIN SYRINGE 1CC/30GX5/16") 30G X 5/16" 1 ML MISC Check blood sugar TID & QHS 100 each 3  . amLODipine (NORVASC) 5 MG tablet Take 1 tablet (5 mg total) by mouth daily. 30 tablet 0  . atorvastatin (LIPITOR) 20 MG tablet Take 1 tablet (20 mg total) by mouth daily. 30 tablet 6  . insulin glargine (LANTUS) 100 UNIT/ML injection Inject 0.2 mLs (20 Units  total) into the skin at bedtime. (Patient taking differently: Inject 12 Units into the skin at bedtime. ) 30 mL 6  . metFORMIN (GLUCOPHAGE) 500 MG tablet Take 2 tablets (1,000 mg total) by mouth 2 (two) times daily with a meal. 60 tablet 6  . cephALEXin (KEFLEX) 500 MG capsule Take 1 capsule (500 mg total) by mouth 3 (three) times daily for 5 days. (Patient not taking: Reported on 07/07/2018) 15 capsule 0   No facility-administered medications prior to visit.     ROS Review of Systems  Constitutional: Negative for activity change, appetite change and fatigue.  HENT: Negative for congestion, sinus pressure and sore throat.   Eyes: Negative for visual disturbance.  Respiratory: Negative for cough, chest tightness, shortness of breath and wheezing.   Cardiovascular: Negative for chest pain and palpitations.  Gastrointestinal: Negative for abdominal distention, abdominal pain and constipation.  Endocrine: Negative for polydipsia.  Genitourinary: Negative for dysuria and frequency.  Musculoskeletal: Negative for arthralgias and back pain.  Skin: Positive for wound. Negative for rash.  Neurological: Negative for tremors, light-headedness and numbness.  Hematological: Does not bruise/bleed easily.  Psychiatric/Behavioral: Negative for agitation and behavioral problems.    Objective:  BP (!) 174/85   Pulse 80   Temp 97.9 F (36.6 C) (Oral)   Ht 4\' 9"  (1.448 m)   Wt 100 lb 9.6 oz (45.6  kg)   SpO2 98%   BMI 21.77 kg/m   BP/Weight 07/07/2018 07/03/2018 1/61/0960  Systolic BP 454 098 119  Diastolic BP 85 87 78  Wt. (Lbs) 100.6 - 100.75  BMI 21.77 - 21.8      Physical Exam Constitutional:      Appearance: She is well-developed.  Cardiovascular:     Rate and Rhythm: Normal rate.     Heart sounds: Normal heart sounds. No murmur.  Pulmonary:     Effort: Pulmonary effort is normal.     Breath sounds: Normal breath sounds. No wheezing or rales.  Chest:     Chest wall: No  tenderness.  Abdominal:     General: Bowel sounds are normal. There is no distension.     Palpations: Abdomen is soft. There is no mass.     Tenderness: There is no abdominal tenderness.  Musculoskeletal: Normal range of motion.  Skin:    Comments: Medial aspect of left big toe with skin ulcer limited to skin, no purulent discharge, no tenderness Reddish discoloration in webspaces of left foot  Neurological:     Mental Status: She is alert and oriented to person, place, and time.  Psychiatric:        Mood and Affect: Mood normal.        Behavior: Behavior normal.     CMP Latest Ref Rng & Units 07/02/2018 11/18/2017 11/17/2017  Glucose 70 - 99 mg/dL 579(HH) 212(H) 340(H)  BUN 8 - 23 mg/dL 9 16 20   Creatinine 0.44 - 1.00 mg/dL 0.68 0.62 0.90  Sodium 135 - 145 mmol/L 132(L) 140 136  Potassium 3.5 - 5.1 mmol/L 3.0(L) 2.9(L) 3.1(L)  Chloride 98 - 111 mmol/L 95(L) 107 103  CO2 22 - 32 mmol/L 25 23 22   Calcium 8.9 - 10.3 mg/dL 8.9 8.4(L) 9.0  Total Protein 6.5 - 8.1 g/dL 6.8 6.6 -  Total Bilirubin 0.3 - 1.2 mg/dL 0.6 0.9 -  Alkaline Phos 38 - 126 U/L 112 66 -  AST 15 - 41 U/L 20 18 -  ALT 0 - 44 U/L 17 13(L) -    Lipid Panel     Component Value Date/Time   CHOL 243 (H) 08/02/2017 0828   TRIG 155 (H) 08/02/2017 0828   HDL 48 08/02/2017 0828   CHOLHDL 5.1 (H) 08/02/2017 0828   LDLCALC 164 (H) 08/02/2017 0828    Lab Results  Component Value Date   HGBA1C 14.6 (A) 07/07/2018    Assessment & Plan:   1. Diabetes mellitus without complication ([place]) Uncontrolled with A1c of 14.6 due to noncompliance with medications and diabetic diet Advised to administer 20 units rather than 12 units of Lantus Emphasized the need to be compliant with a diabetic diet and lifestyle modifications - POCT glucose (manual entry) - POCT glycosylated hemoglobin (Hb A1C) - metFORMIN (GLUCOPHAGE) 500 MG tablet; Take 2 tablets (1,000 mg total) by mouth 2 (two) times daily with a meal.  Dispense: 60  tablet; Refill: 6 - atorvastatin (LIPITOR) 20 MG tablet; Take 1 tablet (20 mg total) by mouth daily.  Dispense: 30 tablet; Refill: 6 - insulin glargine (LANTUS) 100 UNIT/ML injection; Inject 0.2 mLs (20 Units total) into the skin at bedtime.  Dispense: 30 mL; Refill: 6  2. Essential hypertension Uncontrolled-yet to take antihypertensive today Lisinopril added to regimen - amLODipine (NORVASC) 10 MG tablet; Take 1 tablet (10 mg total) by mouth daily.  Dispense: 30 tablet; Refill: 6 - lisinopril (PRINIVIL,ZESTRIL) 5 MG tablet; Take 1 tablet (  5 mg total) by mouth daily.  Dispense: 30 tablet; Refill: 6  3. 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 Non-compliance Discussed implications of noncompliance-poor judgment largely contributory  5. Hypokalemia Last potassium was 3.2 Hopefully initiation of lisinopril will improve this.   Meds ordered this encounter  Medications  . metFORMIN (GLUCOPHAGE) 500 MG tablet    Sig: Take 2 tablets (1,000 mg total) by mouth 2 (two) times daily with a meal.    Dispense:  60 tablet    Refill:  6  . atorvastatin (LIPITOR) 20 MG tablet    Sig: Take 1 tablet (20 mg total) by mouth daily.    Dispense:  30 tablet    Refill:  6  . amLODipine (NORVASC) 10 MG tablet    Sig: Take 1 tablet (10 mg total) by mouth daily.    Dispense:  30 tablet    Refill:  6  . insulin glargine (LANTUS) 100 UNIT/ML injection    Sig: Inject 0.2 mLs (20 Units total) into the skin at bedtime.    Dispense:  30 mL    Refill:  6    Discontinue previous dose  . lisinopril (PRINIVIL,ZESTRIL) 5 MG tablet    Sig: Take 1 tablet (5 mg total) by mouth daily.    Dispense:  30 tablet    Refill:  6    Follow-up: Return in  about 1 month (around 08/07/2018) for Follow-up on hypertension and diabetic foot ulcer.   Charlott Rakes MD

## 2018-07-08 MED FILL — AMLODIPINE BESYLATE 10 MG T: 10 | 30 days supply | Qty: 30 | Fill #1

## 2018-07-08 MED FILL — LISINOPRIL 2.5 MG TABLET: 2.5 | 30 days supply | Qty: 30 | Fill #0

## 2018-07-08 MED FILL — metFORMIN HCL 500 MG TABS: 500 | 15 days supply | Qty: 60 | Fill #1

## 2018-07-08 MED FILL — ATORVASTATIN 20 MG TABLET: 20 | 30 days supply | Qty: 30 | Fill #1

## 2018-08-26 MED FILL — LISINOPRIL 2.5 MG TABLET: 2.5 | 30 days supply | Qty: 30 | Fill #1

## 2018-08-26 MED FILL — AMLODIPINE BESYLATE 10 MG T: 10 | 30 days supply | Qty: 30 | Fill #2

## 2018-08-26 MED FILL — ATORVASTATIN 20 MG TABLET: 20 | 30 days supply | Qty: 30 | Fill #2

## 2018-08-26 MED FILL — metFORMIN HCL 500 MG TABS: 500 | 15 days supply | Qty: 60 | Fill #2

## 2018-10-06 ENCOUNTER — Emergency Department (HOSPITAL_COMMUNITY): Payer: Self-pay

## 2018-10-06 ENCOUNTER — Encounter (HOSPITAL_COMMUNITY): Payer: Self-pay | Admitting: Emergency Medicine

## 2018-10-06 ENCOUNTER — Other Ambulatory Visit: Payer: Self-pay

## 2018-10-06 ENCOUNTER — Emergency Department (HOSPITAL_COMMUNITY)
Admission: EM | Admit: 2018-10-06 | Discharge: 2018-10-06 | Disposition: A | Discharge status: Home / Self Care | Payer: Self-pay | Attending: Emergency Medicine | Admitting: Emergency Medicine

## 2018-10-06 DIAGNOSIS — R059 Cough, unspecified: Secondary | ICD-10-CM

## 2018-10-06 DIAGNOSIS — I10 Essential (primary) hypertension: Secondary | ICD-10-CM | POA: Insufficient documentation

## 2018-10-06 DIAGNOSIS — Z794 Long term (current) use of insulin: Secondary | ICD-10-CM | POA: Insufficient documentation

## 2018-10-06 DIAGNOSIS — R05 Cough: Secondary | ICD-10-CM | POA: Insufficient documentation

## 2018-10-06 DIAGNOSIS — E1165 Type 2 diabetes mellitus with hyperglycemia: Secondary | ICD-10-CM | POA: Insufficient documentation

## 2018-10-06 DIAGNOSIS — R739 Hyperglycemia, unspecified: Secondary | ICD-10-CM

## 2018-10-06 LAB — URINALYSIS, ROUTINE W REFLEX MICROSCOPIC
Bilirubin Urine: NEGATIVE
Glucose, UA: 50 mg/dL — AB
Hgb urine dipstick: NEGATIVE
Ketones, ur: NEGATIVE mg/dL
Leukocytes,Ua: NEGATIVE
Nitrite: NEGATIVE
Protein, ur: 100 mg/dL — AB
Specific Gravity, Urine: 1.018 (ref 1.005–1.030)
pH: 5 (ref 5.0–8.0)

## 2018-10-06 LAB — CBC WITH DIFFERENTIAL/PLATELET
ABS IMMATURE GRANULOCYTES: 0.03 10*3/uL (ref 0.00–0.07)
Basophils Absolute: 0.1 10*3/uL (ref 0.0–0.1)
Basophils Relative: 1 %
Eosinophils Absolute: 0.3 10*3/uL (ref 0.0–0.5)
Eosinophils Relative: 4 %
HCT: 33.9 % — ABNORMAL LOW (ref 36.0–46.0)
Hemoglobin: 11.4 g/dL — ABNORMAL LOW (ref 12.0–15.0)
IMMATURE GRANULOCYTES: 0 %
Lymphocytes Relative: 25 %
Lymphs Abs: 1.9 10*3/uL (ref 0.7–4.0)
MCH: 29.9 pg (ref 26.0–34.0)
MCHC: 33.6 g/dL (ref 30.0–36.0)
MCV: 89 fL (ref 80.0–100.0)
MONOS PCT: 7 %
Monocytes Absolute: 0.5 10*3/uL (ref 0.1–1.0)
Neutro Abs: 4.7 10*3/uL (ref 1.7–7.7)
Neutrophils Relative %: 63 %
Platelets: 339 10*3/uL (ref 150–400)
RBC: 3.81 MIL/uL — ABNORMAL LOW (ref 3.87–5.11)
RDW: 13.1 % (ref 11.5–15.5)
WBC: 7.6 10*3/uL (ref 4.0–10.5)
nRBC: 0 % (ref 0.0–0.2)

## 2018-10-06 LAB — BASIC METABOLIC PANEL
ANION GAP: 6 (ref 5–15)
BUN: 27 mg/dL — ABNORMAL HIGH (ref 8–23)
CO2: 23 mmol/L (ref 22–32)
Calcium: 8.8 mg/dL — ABNORMAL LOW (ref 8.9–10.3)
Chloride: 107 mmol/L (ref 98–111)
Creatinine, Ser: 0.99 mg/dL (ref 0.44–1.00)
GFR calc Af Amer: >60 mL/min (ref >60)
GFR calc non Af Amer: 56 mL/min — ABNORMAL LOW (ref >60)
GLUCOSE: 244 mg/dL — AB (ref 70–99)
Potassium: 3.9 mmol/L (ref 3.5–5.1)
Sodium: 136 mmol/L (ref 135–145)

## 2018-10-06 MED ORDER — BENZONATATE 100 MG PO CAPS: 100.0000 mg | ORAL_CAPSULE | Freq: Three times a day (TID) | ORAL | 0 refills | Status: AC

## 2018-10-06 MED FILL — BENZONATATE 100 MG CAPS: 100 | 5 days supply | Qty: 15 | Fill #0

## 2018-10-06 NOTE — ED Triage Notes (Signed)
Pt arrives PV will complaints of cough X2 weeks. Denies headaches, Fever, and chills. Pt stating she feels good and walks all the time. Denies SOB, vomiting, diahrrea at this time

## 2018-10-06 NOTE — ED Notes (Signed)
This rn contacted pts neighbor for transportation home

## 2018-10-06 NOTE — ED Notes (Signed)
Patient transported to X-ray 

## 2018-10-06 NOTE — ED Provider Notes (Signed)
[facility] EMERGENCY DEPARTMENT Provider Note   CSN: [id number] Arrival date & time: 10/06/18  1344    History   Chief Complaint Chief Complaint  Patient presents with  . Weakness    HPI Emily Hoover is a 73 y.o. female.     Patient with history of diabetes, renal insufficiency, UTI --presents with complaint of cough.  History taken using telephone interpreter.  Patient has had nonproductive cough for 2 to 3 weeks.  She has not had other symptoms including fever, shortness of breath, chest pain, headache, nausea, vomiting, diarrhea.  No wheezing.  She denies any swelling in her legs.  No urinary symptoms.  Patient has not taken any medications for her cough.  Otherwise patient states that she feels good.  She states that she does not have a car so she walks a lot and feels good walking.  She denies any sick contacts.  Onset of symptoms acute.  Course is persistent.  Nothing makes symptoms better or worse.     Past Medical History:  Diagnosis Date  . Diabetes mellitus   . Diabetes mellitus without complication ([place])   . Hypercholesteremia   . Hypertension   . Renal insufficiency     Patient Active Problem List   Diagnosis Date Noted  . Non-compliance 07/07/2018  . Syncope 11/18/2017  . Hypothermia 11/18/2017  . Hypercholesteremia 10/30/2017  . 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 Renal insufficiency 10/10/2015  . Staphylococcus aureus bacteremia 09/19/2015  . UTI (lower urinary tract infection) 09/16/2015  . Hip pain 09/16/2015  . Hypertension 09/16/2015  . Pain management 09/16/2015  . Vertigo 09/07/2015  . Labyrinthitis 09/07/2015  . Neck pain 09/07/2015  . Diabetes mellitus without complication ([place]) 36/64/4034  . Labyrinthitis of right ear   . Chronic neck pain   . Uncontrollable vomiting   . Diabetes ([place]) 06/30/2013    Past Surgical History:  Procedure Laterality Date  . TUBAL LIGATION       OB History    Gravida  0   Para  0   Term  0   Preterm  0   AB  0   Living        SAB  0   TAB  0   Ectopic  0   Multiple      Live Births               Home Medications    Prior to Admission medications   Medication Sig Start Date End Date Taking? Authorizing Provider  amLODipine (NORVASC) 10 MG tablet Take 1 tablet (10 mg total) by mouth daily. 07/07/18   Charlott Rakes, MD  atorvastatin (LIPITOR) 20 MG tablet Take 1 tablet (20 mg total) by mouth daily. 07/07/18   Charlott Rakes, MD  Blood Glucose Monitoring Suppl (TRUE METRIX METER) DEVI 1 each by Does not apply route 3 (three) times daily. 07/07/18   Charlott Rakes, MD  glucose blood (TRUE METRIX BLOOD GLUCOSE TEST) test strip 1 each by Other route 3 (three) times daily. 07/07/18   Charlott Rakes, MD  insulin glargine (LANTUS) 100 UNIT/ML injection Inject 0.2 mLs (20 Units total) into the skin at bedtime. 07/07/18   Charlott Rakes, MD  Insulin Syringe-Needle U-100 (INSULIN SYRINGE 1CC/30GX5/16") 30G X 5/16" 1 ML MISC Check blood sugar TID & QHS 10/10/15   Langeland, Dawn T, MD  lisinopril (PRINIVIL,ZESTRIL) 5 MG tablet Take 1 tablet (5 mg total) by mouth daily. 07/07/18   Charlott Rakes, MD  metFORMIN (GLUCOPHAGE)  500 MG tablet Take 2 tablets (1,000 mg total) by mouth 2 (two) times daily with a meal. 07/07/18   Newlin, Enobong, MD  TRUEPLUS LANCETS 28G MISC 1 each by Does not apply route 3 (three) times daily. 07/07/18   Charlott Rakes, MD    Family History Family History  Family history unknown: Yes  Problem Relation Age of Onset  . Family history unknown: Yes  . CAD Mother   . CAD Father   . Hypertension Father   . Diabetes Neg Hx   . Cancer Neg Hx     Social History Social History   Tobacco Use  . Smoking status: Never Smoker  . Smokeless tobacco: Never Used  Substance Use Topics  . Alcohol use: No  . Drug use: No     Allergies   Patient has no known allergies.   Review of Systems Review of Systems  Constitutional: Negative for  activity change, diaphoresis and fever.  Eyes: Negative for redness.  Respiratory: Positive for cough. Negative for shortness of breath.   Cardiovascular: Negative for chest pain, palpitations and leg swelling.  Gastrointestinal: Negative for abdominal pain, nausea and vomiting.  Genitourinary: Negative for dysuria.  Musculoskeletal: Negative for back pain and neck pain.  Skin: Negative for rash.  Neurological: Negative for syncope and light-headedness.  Psychiatric/Behavioral: The patient is not nervous/anxious.      Physical Exam Updated Vital Signs BP (!) 109/58 (BP Location: Right Arm)   Pulse 92   Temp 98.8 F (37.1 C) (Oral)   Resp 16   SpO2 97%   Physical Exam Vitals signs and nursing note reviewed.  Constitutional:      Appearance: She is well-developed.  HENT:     Head: Normocephalic and atraumatic.  Eyes:     General:        Right eye: No discharge.        Left eye: No discharge.     Conjunctiva/sclera: Conjunctivae normal.  Neck:     Musculoskeletal: Normal range of motion and neck supple.  Cardiovascular:     Rate and Rhythm: Normal rate and regular rhythm.     Heart sounds: Normal heart sounds.  Pulmonary:     Effort: Pulmonary effort is normal.     Breath sounds: Normal breath sounds.  Abdominal:     Palpations: Abdomen is soft.     Tenderness: There is no abdominal tenderness.  Skin:    General: Skin is warm and dry.  Neurological:     Mental Status: She is alert.      ED Treatments / Results  Labs (all labs ordered are listed, but only abnormal results are displayed) Labs Reviewed  CBC WITH DIFFERENTIAL/PLATELET - Abnormal; Notable for the following components:      Result Value   RBC 3.81 (*)    Hemoglobin 11.4 (*)    HCT 33.9 (*)    All other components within normal limits  BASIC METABOLIC PANEL - Abnormal; Notable for the following components:   Glucose, Bld 244 (*)    BUN 27 (*)    Calcium 8.8 (*)    GFR calc non Af Amer 56 (*)     All other components within normal limits  URINALYSIS, ROUTINE W REFLEX MICROSCOPIC - Abnormal; Notable for the following components:   APPearance HAZY (*)    Glucose, UA 50 (*)    Protein, ur 100 (*)    Bacteria, UA RARE (*)    All other components within normal limits  EKG EKG Interpretation  Date/Time:  Monday October 06 2018 13:58:11 EDT Ventricular Rate:  90 PR Interval:    QRS Duration: 68 QT Interval:  346 QTC Calculation: 424 R Axis:   52 Text Interpretation:  Sinus rhythm Probable LVH with secondary repol abnrm no significant change since May 2019 Confirmed by Sherwood Gambler [phone]) on 10/06/2018 2:01:44 PM   Radiology Dg Chest 2 View  Result Date: 10/06/2018 CLINICAL DATA:  73 year old female with cough for 2-3 weeks EXAM: CHEST - 2 VIEW COMPARISON:  11/18/2017, 11/17/2017 FINDINGS: Cardiomediastinal silhouette unchanged in size and contour. No evidence of central vascular congestion. No interlobular septal thickening. No pneumothorax. No pleural effusion. No confluent airspace disease. No displaced fractures. IMPRESSION: Negative for acute cardiopulmonary disease Electronically Signed   By: Corrie Mckusick D.O.   On: 10/06/2018 14:42    Procedures Procedures (including critical care time)  Medications Ordered in ED Medications - No data to display   Initial Impression / Assessment and Plan / ED Course  I have reviewed the triage vital signs and the nursing notes.  Pertinent labs & imaging results that were available during my care of the patient were reviewed by me and considered in my medical decision making (see chart for details).        Patient seen and examined. Work-up initiated. EKG reviewed. Non-specific changes are largely unchanged. Lateral inverted t-waves are less pronounced today.    Vital signs reviewed and are as follows: BP (!) 109/58 (BP Location: Right Arm)   Pulse 92   Temp 98.8 F (37.1 C) (Oral)   Resp 16   Ht 4\' 9"  (1.448 m)   Wt  45.4 kg   SpO2 97%   BMI 21.64 kg/m   3:39 PM patient reevaluated.  She is sitting on the side of bed in no distress.  We discussed her results using bedside interpreter.  Patient again tells me about how she walks a lot and feels well doing this.  She will be discharged home at this time.  Medication given for cough if desired.  Otherwise encouraged return to the emergency department if she has worsening shortness of breath, fever, new symptoms.  Encourage PCP follow-up for continued evaluation of cough if it persists.  Sheralyn Pinegar was evaluated in Emergency Department on 10/06/2018 for the symptoms described in the history of present illness. She was evaluated in the context of the global COVID-19 pandemic, which necessitated consideration that the patient might be at risk for infection with the SARS-CoV-2 virus that causes COVID-19. Institutional protocols and algorithms that pertain to the evaluation of patients at risk for COVID-19 are in a state of rapid change based on information released by regulatory bodies including the CDC and federal and state organizations. These policies and algorithms were followed during the patient's care in the ED.  BP (!) 141/69 (BP Location: Right Arm)   Pulse 88   Temp 98.2 F (36.8 C) (Oral)   Resp 16   Ht 4\' 9"  (1.448 m)   Wt 45.4 kg   SpO2 95%   BMI 21.64 kg/m    Final Clinical Impressions(s) / ED Diagnoses   Final diagnoses:  Cough  Hyperglycemia   Well-appearing patient with normal vital signs, no respiratory distress with complaint of ongoing cough for the past 2 or 3 weeks.  No fevers.  Work-up today with normal-appearing chest x-ray.  Lab work is reassuring.  Patient has elevated blood sugar without signs of DKA and blood sugar appears to  be close to her baseline.  No indications for further work-up or hospitalization.  Will treat symptoms.  Encouraged PCP follow-up.  ED Discharge Orders         Ordered    benzonatate (TESSALON) 100 MG  capsule  Every 8 hours     10/06/18 1513           Carlisle Cater, PA-C 10/06/18 1541    Sherwood Gambler, MD 10/06/18 1544

## 2018-10-06 NOTE — ED Notes (Signed)
Pt's friend  Hedwig Morton [phone]. Pls contact for D/c'd

## 2018-10-06 NOTE — Discharge Instructions (Signed)
Please read and follow all provided instructions.  Your diagnoses today include:  1. Cough   2. Hyperglycemia    Tests performed today include:  Chest x-ray - no pneumonia  Blood counts and electrolytes -look good, blood sugar is high  Vital signs. See below for your results today.   Medications prescribed:   Tessalon Perles - cough suppressant medication  Take any prescribed medications only as directed.  Home care instructions:  Follow any educational materials contained in this packet.  Follow-up instructions: Please follow-up with your primary care provider in the next 3 days for further evaluation of your symptoms.   Return instructions:   Please return to the Emergency Department if you experience worsening symptoms.   Return with high fever, worsening shortness of breath, or chest pain.  Please return if you have any other emergent concerns.  Additional Information:  Your vital signs today were: BP (!) 141/69 (BP Location: Right Arm)    Pulse 88    Temp 98.2 F (36.8 C) (Oral)    Resp 16    Ht 4\' 9"  (1.448 m)    Wt 45.4 kg    SpO2 95%    BMI 21.64 kg/m  If your blood pressure (BP) was elevated above 135/85 this visit, please have this repeated by your doctor within one month. --------------

## 2018-10-06 NOTE — ED Notes (Signed)
Patient verbalizes understanding of discharge instructions. Opportunity for questioning and answers were provided. Armband removed by staff, pt discharged from ED.  

## 2018-10-29 MED FILL — TRUEplus LANCETS 28G MISC: 33 days supply | Qty: 100 | Fill #1

## 2018-10-29 MED FILL — AMLODIPINE BESYLATE 10 MG T: 10 | 30 days supply | Qty: 30 | Fill #3

## 2018-10-29 MED FILL — !LANTUS 100 UNITS/ML VIAL: 100 | 84 days supply | Qty: 30 | Fill #2

## 2018-10-29 MED FILL — metFORMIN HCL 500 MG TABS: 500 | 15 days supply | Qty: 60 | Fill #3

## 2018-10-29 MED FILL — TRUE METRIX TEST STRIP: 33 days supply | Qty: 100 | Fill #1

## 2018-10-29 MED FILL — LISINOPRIL 2.5 MG TABLET: 2.5 | 30 days supply | Qty: 30 | Fill #2

## 2018-10-29 MED FILL — ATORVASTATIN 20 MG TABLET: 20 | 30 days supply | Qty: 30 | Fill #3

## 2019-04-29 ENCOUNTER — Telehealth: Payer: Self-pay | Admitting: Family Medicine

## 2019-04-29 DIAGNOSIS — E119 Type 2 diabetes mellitus without complications: Secondary | ICD-10-CM

## 2019-04-29 DIAGNOSIS — I10 Essential (primary) hypertension: Secondary | ICD-10-CM

## 2019-04-29 MED ORDER — LISINOPRIL 5 MG PO TABS: 5.0000 mg | ORAL_TABLET | Freq: Every day | ORAL | 0 refills | Status: DC

## 2019-04-29 MED ORDER — ATORVASTATIN CALCIUM 20 MG PO TABS: 20.0000 mg | ORAL_TABLET | Freq: Every day | ORAL | 0 refills | Status: DC

## 2019-04-29 MED ORDER — METFORMIN HCL 500 MG PO TABS: 1000.0000 mg | ORAL_TABLET | Freq: Two times a day (BID) | ORAL | 0 refills | Status: DC

## 2019-04-29 MED ORDER — AMLODIPINE BESYLATE 10 MG PO TABS: 10.0000 mg | ORAL_TABLET | Freq: Every day | ORAL | 0 refills | Status: DC

## 2019-04-29 MED ORDER — INSULIN GLARGINE 100 UNIT/ML ~~LOC~~ SOLN: 20.0000 [IU] | Freq: Every day | SUBCUTANEOUS | 0 refills | Status: DC

## 2019-04-29 MED FILL — ATORVASTATIN CALCIUM 20 MG: 20 | 30 days supply | Qty: 30 | Fill #0

## 2019-04-29 MED FILL — LISINOPRIL 5 MG TABLET: 5 | 30 days supply | Qty: 30 | Fill #0

## 2019-04-29 MED FILL — AMLODIPINE BESYLATE 10 MG T: 10 | 30 days supply | Qty: 30 | Fill #0

## 2019-04-29 MED FILL — !LANTUS 100 UNITS/ML VIAL: 100 | 28 days supply | Qty: 10 | Fill #0

## 2019-04-29 MED FILL — metFORMIN HCL 500 MG TABS: 500 | 30 days supply | Qty: 120 | Fill #0

## 2019-04-29 NOTE — Telephone Encounter (Signed)
1) Medication(s) Requested (by name): -amLODipine (NORVASC) 10 MG tablet  -lisinopril (PRINIVIL,ZESTRIL) 5 MG tablet  -atorvastatin (LIPITOR) 20 MG tablet  -insulin glargine (LANTUS) 100 UNIT/ML injection  -metFORMIN (GLUCOPHAGE) 500 MG tablet   2) Pharmacy of Choice: [facility]  3) Special Requests: Until next appt 05/13/2019

## 2019-04-29 NOTE — Telephone Encounter (Signed)
Refills sent for 1 month to hold the patient until her next appointment.

## 2019-05-13 ENCOUNTER — Ambulatory Visit: Payer: Self-pay | Admitting: Family Medicine

## 2019-05-18 ENCOUNTER — Other Ambulatory Visit: Payer: Self-pay

## 2019-05-18 ENCOUNTER — Inpatient Hospital Stay (HOSPITAL_COMMUNITY)
Admission: EM | Admit: 2019-05-18 | Discharge: 2019-05-30 | DRG: 183 | Disposition: A | Discharge status: Home / Self Care | Payer: Medicaid Other | Attending: Internal Medicine | Admitting: Internal Medicine

## 2019-05-18 ENCOUNTER — Encounter (HOSPITAL_COMMUNITY): Payer: Self-pay

## 2019-05-18 ENCOUNTER — Emergency Department (HOSPITAL_COMMUNITY): Payer: Medicaid Other

## 2019-05-18 DIAGNOSIS — Z515 Encounter for palliative care: Secondary | ICD-10-CM | POA: Diagnosis not present

## 2019-05-18 DIAGNOSIS — I10 Essential (primary) hypertension: Secondary | ICD-10-CM | POA: Diagnosis present

## 2019-05-18 DIAGNOSIS — S2242XA Multiple fractures of ribs, left side, initial encounter for closed fracture: Principal | ICD-10-CM | POA: Diagnosis present

## 2019-05-18 DIAGNOSIS — E871 Hypo-osmolality and hyponatremia: Secondary | ICD-10-CM | POA: Diagnosis present

## 2019-05-18 DIAGNOSIS — E1165 Type 2 diabetes mellitus with hyperglycemia: Secondary | ICD-10-CM | POA: Diagnosis present

## 2019-05-18 DIAGNOSIS — R739 Hyperglycemia, unspecified: Secondary | ICD-10-CM

## 2019-05-18 DIAGNOSIS — Z23 Encounter for immunization: Secondary | ICD-10-CM

## 2019-05-18 DIAGNOSIS — K5900 Constipation, unspecified: Secondary | ICD-10-CM | POA: Diagnosis present

## 2019-05-18 DIAGNOSIS — M25552 Pain in left hip: Secondary | ICD-10-CM

## 2019-05-18 DIAGNOSIS — Z79899 Other long term (current) drug therapy: Secondary | ICD-10-CM

## 2019-05-18 DIAGNOSIS — Z794 Long term (current) use of insulin: Secondary | ICD-10-CM

## 2019-05-18 DIAGNOSIS — Y92009 Unspecified place in unspecified non-institutional (private) residence as the place of occurrence of the external cause: Secondary | ICD-10-CM

## 2019-05-18 DIAGNOSIS — D649 Anemia, unspecified: Secondary | ICD-10-CM | POA: Diagnosis present

## 2019-05-18 DIAGNOSIS — S32592A Other specified fracture of left pubis, initial encounter for closed fracture: Secondary | ICD-10-CM | POA: Diagnosis present

## 2019-05-18 DIAGNOSIS — S2249XA Multiple fractures of ribs, unspecified side, initial encounter for closed fracture: Secondary | ICD-10-CM | POA: Diagnosis present

## 2019-05-18 DIAGNOSIS — E11649 Type 2 diabetes mellitus with hypoglycemia without coma: Secondary | ICD-10-CM | POA: Diagnosis not present

## 2019-05-18 DIAGNOSIS — W1830XA Fall on same level, unspecified, initial encounter: Secondary | ICD-10-CM | POA: Diagnosis present

## 2019-05-18 DIAGNOSIS — Z8249 Family history of ischemic heart disease and other diseases of the circulatory system: Secondary | ICD-10-CM

## 2019-05-18 DIAGNOSIS — M25559 Pain in unspecified hip: Secondary | ICD-10-CM | POA: Diagnosis present

## 2019-05-18 DIAGNOSIS — I160 Hypertensive urgency: Secondary | ICD-10-CM | POA: Diagnosis present

## 2019-05-18 DIAGNOSIS — U071 COVID-19: Secondary | ICD-10-CM | POA: Diagnosis present

## 2019-05-18 DIAGNOSIS — E119 Type 2 diabetes mellitus without complications: Secondary | ICD-10-CM

## 2019-05-18 DIAGNOSIS — E7800 Pure hypercholesterolemia, unspecified: Secondary | ICD-10-CM | POA: Diagnosis present

## 2019-05-18 DIAGNOSIS — W19XXXA Unspecified fall, initial encounter: Secondary | ICD-10-CM

## 2019-05-18 DIAGNOSIS — R109 Unspecified abdominal pain: Secondary | ICD-10-CM | POA: Clinically undetermined

## 2019-05-18 DIAGNOSIS — E785 Hyperlipidemia, unspecified: Secondary | ICD-10-CM | POA: Diagnosis present

## 2019-05-18 NOTE — ED Triage Notes (Signed)
Spanish interpreter used for triage:  Pt reports a fall yesterday, c.o left sided pain from her flank down to her foot. Denies dizziness or feeling lightheaded that led to the fall. No LOC and she did not hit her head. Pt a.o , nad noted

## 2019-05-19 ENCOUNTER — Emergency Department (HOSPITAL_COMMUNITY): Payer: Medicaid Other

## 2019-05-19 ENCOUNTER — Encounter (HOSPITAL_COMMUNITY): Payer: Self-pay | Admitting: General Surgery

## 2019-05-19 DIAGNOSIS — E1165 Type 2 diabetes mellitus with hyperglycemia: Secondary | ICD-10-CM

## 2019-05-19 DIAGNOSIS — S2249XA Multiple fractures of ribs, unspecified side, initial encounter for closed fracture: Secondary | ICD-10-CM | POA: Diagnosis present

## 2019-05-19 DIAGNOSIS — E7800 Pure hypercholesterolemia, unspecified: Secondary | ICD-10-CM

## 2019-05-19 DIAGNOSIS — I160 Hypertensive urgency: Secondary | ICD-10-CM | POA: Diagnosis present

## 2019-05-19 DIAGNOSIS — Y92009 Unspecified place in unspecified non-institutional (private) residence as the place of occurrence of the external cause: Secondary | ICD-10-CM

## 2019-05-19 DIAGNOSIS — U071 COVID-19: Secondary | ICD-10-CM

## 2019-05-19 DIAGNOSIS — S2242XA Multiple fractures of ribs, left side, initial encounter for closed fracture: Principal | ICD-10-CM

## 2019-05-19 DIAGNOSIS — W19XXXA Unspecified fall, initial encounter: Secondary | ICD-10-CM

## 2019-05-19 LAB — COMPREHENSIVE METABOLIC PANEL
ALT: 15 U/L (ref 0–44)
AST: 16 U/L (ref 15–41)
Albumin: 3.3 g/dL — ABNORMAL LOW (ref 3.5–5.0)
Alkaline Phosphatase: 107 U/L (ref 38–126)
Anion gap: 13 (ref 5–15)
BUN: 21 mg/dL (ref 8–23)
CO2: 25 mmol/L (ref 22–32)
Calcium: 9.9 mg/dL (ref 8.9–10.3)
Chloride: 95 mmol/L — ABNORMAL LOW (ref 98–111)
Creatinine, Ser: 0.78 mg/dL (ref 0.44–1.00)
GFR calc Af Amer: >60 mL/min (ref >60)
GFR calc non Af Amer: >60 mL/min (ref >60)
Glucose, Bld: 413 mg/dL — ABNORMAL HIGH (ref 70–99)
Potassium: 3.7 mmol/L (ref 3.5–5.1)
Sodium: 133 mmol/L — ABNORMAL LOW (ref 135–145)
Total Bilirubin: 1.2 mg/dL (ref 0.3–1.2)
Total Protein: 7.8 g/dL (ref 6.5–8.1)

## 2019-05-19 LAB — CBC WITH DIFFERENTIAL/PLATELET
Abs Immature Granulocytes: 0.02 10*3/uL (ref 0.00–0.07)
Basophils Absolute: 0.1 10*3/uL (ref 0.0–0.1)
Basophils Relative: 1 %
Eosinophils Absolute: 0 10*3/uL (ref 0.0–0.5)
Eosinophils Relative: 0 %
HCT: 37.2 % (ref 36.0–46.0)
Hemoglobin: 12.5 g/dL (ref 12.0–15.0)
Immature Granulocytes: 0 %
Lymphocytes Relative: 15 %
Lymphs Abs: 1 10*3/uL (ref 0.7–4.0)
MCH: 29.8 pg (ref 26.0–34.0)
MCHC: 33.6 g/dL (ref 30.0–36.0)
MCV: 88.8 fL (ref 80.0–100.0)
Monocytes Absolute: 0.4 10*3/uL (ref 0.1–1.0)
Monocytes Relative: 6 %
Neutro Abs: 5.6 10*3/uL (ref 1.7–7.7)
Neutrophils Relative %: 78 %
Platelets: 389 10*3/uL (ref 150–400)
RBC: 4.19 MIL/uL (ref 3.87–5.11)
RDW: 13.8 % (ref 11.5–15.5)
WBC: 7.1 10*3/uL (ref 4.0–10.5)
nRBC: 0 % (ref 0.0–0.2)

## 2019-05-19 LAB — URINALYSIS, ROUTINE W REFLEX MICROSCOPIC
Bacteria, UA: NONE SEEN
Bilirubin Urine: NEGATIVE
Glucose, UA: >=500 mg/dL — AB
Ketones, ur: NEGATIVE mg/dL
Leukocytes,Ua: NEGATIVE
Nitrite: NEGATIVE
Protein, ur: >=300 mg/dL — AB
Specific Gravity, Urine: 1.017 (ref 1.005–1.030)
pH: 7 (ref 5.0–8.0)

## 2019-05-19 LAB — CBG MONITORING, ED
Glucose-Capillary: 382 mg/dL — ABNORMAL HIGH (ref 70–99)
Glucose-Capillary: 258 mg/dL — ABNORMAL HIGH (ref 70–99)

## 2019-05-19 LAB — SARS CORONAVIRUS 2 (TAT 6-24 HRS): SARS Coronavirus 2: POSITIVE — AB

## 2019-05-19 MED ORDER — IOHEXOL 300 MG/ML  SOLN
100.0000 mL | Freq: Once | INTRAMUSCULAR | Status: AC | PRN
  Administered 2019-05-19: 100 mL via INTRAVENOUS

## 2019-05-19 MED ORDER — ALBUTEROL SULFATE HFA 108 (90 BASE) MCG/ACT IN AERS
2.0000 | INHALATION_SPRAY | Freq: Four times a day (QID) | RESPIRATORY_TRACT | Status: DC | PRN
  Administered 2019-05-20 – 2019-05-29 (×2): 2 via RESPIRATORY_TRACT
  Filled 2019-05-19: qty 6.7

## 2019-05-19 MED ORDER — SODIUM CHLORIDE 0.9% FLUSH
3.0000 mL | Freq: Two times a day (BID) | INTRAVENOUS | Status: DC
  Administered 2019-05-19 – 2019-05-30 (×22): 3 mL via INTRAVENOUS

## 2019-05-19 MED ORDER — HYDROCODONE-ACETAMINOPHEN 5-325 MG PO TABS: 1.0000 | ORAL_TABLET | ORAL | Status: DC | PRN

## 2019-05-19 MED ORDER — TRAMADOL HCL 50 MG PO TABS: 50.0000 mg | ORAL_TABLET | Freq: Four times a day (QID) | ORAL | Status: DC | PRN

## 2019-05-19 MED ORDER — VITAMIN C 500 MG PO TABS
500.0000 mg | ORAL_TABLET | Freq: Every day | ORAL | Status: DC
  Administered 2019-05-19 – 2019-05-30 (×12): 500 mg via ORAL
  Filled 2019-05-19 (×12): qty 1

## 2019-05-19 MED ORDER — AMLODIPINE BESYLATE 10 MG PO TABS
10.0000 mg | ORAL_TABLET | Freq: Every day | ORAL | Status: DC
  Administered 2019-05-19 – 2019-05-30 (×12): 10 mg via ORAL
  Filled 2019-05-19: qty 1
  Filled 2019-05-19: qty 2
  Filled 2019-05-19 (×11): qty 1

## 2019-05-19 MED ORDER — ACETAMINOPHEN 500 MG PO TABS: 1000.0000 mg | ORAL_TABLET | Freq: Four times a day (QID) | ORAL | Status: DC | PRN

## 2019-05-19 MED ORDER — ONDANSETRON HCL 4 MG/2ML IJ SOLN
4.0000 mg | Freq: Once | INTRAMUSCULAR | Status: AC
  Administered 2019-05-19: 12:00:00 4 mg via INTRAVENOUS
  Filled 2019-05-19: qty 2

## 2019-05-19 MED ORDER — TRAMADOL HCL 50 MG PO TABS
50.0000 mg | ORAL_TABLET | Freq: Four times a day (QID) | ORAL | Status: DC | PRN
  Administered 2019-05-20: 50 mg via ORAL
  Filled 2019-05-19: qty 1

## 2019-05-19 MED ORDER — ONDANSETRON HCL 4 MG PO TABS: 4.0000 mg | ORAL_TABLET | Freq: Four times a day (QID) | ORAL | Status: DC | PRN

## 2019-05-19 MED ORDER — ONDANSETRON HCL 4 MG/2ML IJ SOLN
4.0000 mg | Freq: Four times a day (QID) | INTRAMUSCULAR | Status: DC | PRN
  Administered 2019-05-20 – 2019-05-27 (×5): 4 mg via INTRAVENOUS
  Filled 2019-05-19 (×5): qty 2

## 2019-05-19 MED ORDER — HYDROCODONE-ACETAMINOPHEN 5-325 MG PO TABS
1.0000 | ORAL_TABLET | ORAL | Status: DC | PRN
  Administered 2019-05-19 – 2019-05-20 (×3): 1 via ORAL
  Filled 2019-05-19 (×3): qty 1

## 2019-05-19 MED ORDER — ENOXAPARIN SODIUM 40 MG/0.4ML ~~LOC~~ SOLN
40.0000 mg | Freq: Every day | SUBCUTANEOUS | Status: DC
  Administered 2019-05-20 – 2019-05-30 (×11): 40 mg via SUBCUTANEOUS
  Filled 2019-05-19 (×12): qty 0.4

## 2019-05-19 MED ORDER — GUAIFENESIN-DM 100-10 MG/5ML PO SYRP
10.0000 mL | ORAL_SOLUTION | ORAL | Status: DC | PRN
  Administered 2019-05-20 – 2019-05-27 (×2): 10 mL via ORAL
  Filled 2019-05-19 (×3): qty 10

## 2019-05-19 MED ORDER — LISINOPRIL 10 MG PO TABS
5.0000 mg | ORAL_TABLET | Freq: Every day | ORAL | Status: DC
  Administered 2019-05-19 – 2019-05-30 (×12): 5 mg via ORAL
  Filled 2019-05-19 (×12): qty 1

## 2019-05-19 MED ORDER — ALBUTEROL SULFATE (2.5 MG/3ML) 0.083% IN NEBU: 2.5000 mg | INHALATION_SOLUTION | Freq: Four times a day (QID) | RESPIRATORY_TRACT | Status: DC | PRN

## 2019-05-19 MED ORDER — SODIUM CHLORIDE 0.9 % IV BOLUS
500.0000 mL | Freq: Once | INTRAVENOUS | Status: AC
  Administered 2019-05-19: 09:00:00 500 mL via INTRAVENOUS

## 2019-05-19 MED ORDER — ACETAMINOPHEN 325 MG PO TABS
650.0000 mg | ORAL_TABLET | Freq: Four times a day (QID) | ORAL | Status: DC | PRN
  Administered 2019-05-20: 650 mg via ORAL
  Filled 2019-05-19: qty 2

## 2019-05-19 MED ORDER — FENTANYL CITRATE (PF) 100 MCG/2ML IJ SOLN
50.0000 ug | Freq: Once | INTRAMUSCULAR | Status: AC
  Administered 2019-05-19: 11:00:00 50 ug via INTRAVENOUS
  Filled 2019-05-19: qty 2

## 2019-05-19 MED ORDER — LABETALOL HCL 5 MG/ML IV SOLN: 5.0000 mg | INTRAVENOUS | Status: DC | PRN

## 2019-05-19 MED ORDER — SODIUM CHLORIDE 0.9 % IV SOLN
Freq: Once | INTRAVENOUS | Status: AC
  Administered 2019-05-19: 18:00:00 via INTRAVENOUS

## 2019-05-19 MED ORDER — INSULIN GLARGINE 100 UNIT/ML ~~LOC~~ SOLN
20.0000 [IU] | Freq: Every day | SUBCUTANEOUS | Status: DC
  Administered 2019-05-19 – 2019-05-21 (×3): 20 [IU] via SUBCUTANEOUS
  Filled 2019-05-19 (×5): qty 0.2

## 2019-05-19 MED ORDER — INSULIN ASPART 100 UNIT/ML ~~LOC~~ SOLN
0.0000 [IU] | Freq: Three times a day (TID) | SUBCUTANEOUS | Status: DC
  Administered 2019-05-19: 8 [IU] via SUBCUTANEOUS
  Administered 2019-05-20 – 2019-05-21 (×2): 3 [IU] via SUBCUTANEOUS
  Administered 2019-05-21 – 2019-05-22 (×2): 2 [IU] via SUBCUTANEOUS

## 2019-05-19 MED ORDER — ATORVASTATIN CALCIUM 10 MG PO TABS
20.0000 mg | ORAL_TABLET | Freq: Every day | ORAL | Status: DC
  Administered 2019-05-19 – 2019-05-30 (×12): 20 mg via ORAL
  Filled 2019-05-19 (×12): qty 2

## 2019-05-19 MED ORDER — SENNOSIDES-DOCUSATE SODIUM 8.6-50 MG PO TABS
1.0000 | ORAL_TABLET | Freq: Two times a day (BID) | ORAL | Status: DC
  Administered 2019-05-19: 1 via ORAL
  Filled 2019-05-19: qty 1

## 2019-05-19 MED ORDER — ZINC SULFATE 220 (50 ZN) MG PO CAPS
220.0000 mg | ORAL_CAPSULE | Freq: Every day | ORAL | Status: DC
  Administered 2019-05-19 – 2019-05-30 (×12): 220 mg via ORAL
  Filled 2019-05-19 (×13): qty 1

## 2019-05-19 NOTE — ED Notes (Signed)
Pt denies any current pain at this time.  Will hold on pain meds for now.  Moves easily in bed without distress.

## 2019-05-19 NOTE — ED Notes (Signed)
Admitting MD Dr. Tamala Julian in with patient

## 2019-05-19 NOTE — Discharge Planning (Signed)
[facility] consulted regarding home health services.  EDCM will contact [facility] to complete charity application. Will update team with results.

## 2019-05-19 NOTE — H&P (Addendum)
History and Physical    Emily Hoover [id number] DOB: Jun 03, 1946 DOA: 05/18/2019  Referring MD/NP/PA: Gust Brooms, PA-C PCP: Charlott Rakes, MD  Patient coming from: Via EMS  Chief Complaint: Left-sided pain  I have personally briefly reviewed patient's old medical records in [place]   HPI: Emily Hoover is a 73 y.o. female with medical history significant of hypertension, hyperlipidemia, and uncontrolled diabetes mellitus type 2.  She presented last night with complaints of left-sided pain after having a fall the previous day.  History is obtained with the use of interpreter services, but limited due patient confusion with the translator.  She reportedly was walking back to her house when she felt herself falling towards the left side.  Patient ambulates without use of any assistive devices.  Denied any trauma to her head, loss of consciousness, tripping over anything to cause the fall, chest pain, palpitations, lightheadedness, nausea, vomiting, or diarrhea symptoms. After the fall she reported having pain on the left side of her ribs with difficulty taking a deep breath in.  She had not tried anything specifically to help with pain symptoms.  Patient reports that her doctor just had started her on new medication that she cannot recall the name of for her blood sugars.  ED Course: Upon admission to the emergency department patient was noted to have O2 saturations maintained on room air, but blood pressure elevated up to 194/90.  Labs significant for sodium 133, BUN 21, creatinine 0.78, and glucose 413.  x-rays revealed fractures of the left lateral ribs 5- 7 and left posterior ribs 10-11.  No other acute abnormalities or fractures were noted on any of the other imaging studies.  Trauma was consulted, but recommended medicine admission due to patient's elevated blood sugar.  Review of systems: A complete 10 point review of systems was performed and negative except for as noted  above in HPI.  Past Medical History:  Diagnosis Date   Diabetes mellitus    Diabetes mellitus without complication ([place])    Hypercholesteremia    Hypertension    Renal insufficiency     Past Surgical History:  Procedure Laterality Date   TUBAL LIGATION       reports that she has never smoked. She has never used smokeless tobacco. She reports that she does not drink alcohol or use drugs.  No Known Allergies  Family History  Family history unknown: Yes  Problem Relation Age of Onset   Family history unknown: Yes   CAD Mother    CAD Father    Hypertension Father    Diabetes Neg Hx    Cancer Neg Hx     Prior to Admission medications   Medication Sig Start Date End Date Taking? Authorizing Provider  amLODipine (NORVASC) 10 MG tablet Take 1 tablet (10 mg total) by mouth daily. 04/29/19  Yes Charlott Rakes, MD  atorvastatin (LIPITOR) 20 MG tablet Take 1 tablet (20 mg total) by mouth daily. 04/29/19  Yes Newlin, Charlane Ferretti, MD  insulin glargine (LANTUS) 100 UNIT/ML injection Inject 0.2 mLs (20 Units total) into the skin at bedtime. 04/29/19  Yes Newlin, Charlane Ferretti, MD  lisinopril (ZESTRIL) 5 MG tablet Take 1 tablet (5 mg total) by mouth daily. 04/29/19  Yes Charlott Rakes, MD  metFORMIN (GLUCOPHAGE) 500 MG tablet Take 2 tablets (1,000 mg total) by mouth 2 (two) times daily with a meal. 04/29/19  Yes Newlin, Enobong, MD  benzonatate (TESSALON) 100 MG capsule Take 1 capsule (100 mg total) by mouth every 8 (  eight) hours. Patient not taking: Reported on 05/19/2019 10/06/18   Carlisle Cater, PA-C  Blood Glucose Monitoring Suppl (TRUE METRIX METER) DEVI 1 each by Does not apply route 3 (three) times daily. 07/07/18   Charlott Rakes, MD  glucose blood (TRUE METRIX BLOOD GLUCOSE TEST) test strip 1 each by Other route 3 (three) times daily. 07/07/18   Charlott Rakes, MD  Insulin Syringe-Needle U-100 (INSULIN SYRINGE 1CC/30GX5/16") 30G X 5/16" 1 ML MISC Check blood sugar TID & QHS  10/10/15   Langeland, Dawn T, MD  TRUEPLUS LANCETS 28G MISC 1 each by Does not apply route 3 (three) times daily. 07/07/18   Charlott Rakes, MD    Physical Exam:  Constitutional: Early female currently in NAD, calm, comfortable Vitals:   05/19/19 1145 05/19/19 1200 05/19/19 1215 05/19/19 1230  BP: (!) 189/88 (!) 184/93 (!) 194/84 (!) 179/85  Pulse:   76 76  Resp: 18 16 19 20   Temp:      TempSrc:      SpO2:   100% 100%   Eyes: PERRL, lids and conjunctivae normal ENMT: Mucous membranes are moist. Posterior pharynx clear of any exudate or lesions.Normal dentition.  Neck: normal, supple, no masses, no thyromegaly Respiratory: Mildly decreased aeration.  No significant wheezes or rhonchi appreciated.  Tenderness palpation of left chest wall. Cardiovascular: Regular rate and rhythm, no murmurs / rubs / gallops. No extremity edema. 2+ pedal pulses. No carotid bruits.  Abdomen: no tenderness, no masses palpated. No hepatosplenomegaly. Bowel sounds positive.  Musculoskeletal: no clubbing / cyanosis. No joint deformity upper and lower extremities. Good ROM, no contractures. Normal muscle tone.  Skin: no rashes, lesions, ulcers. No induration Neurologic: CN 2-12 grossly intact. Sensation intact, DTR normal. Strength 5/5 in all 4.  Psychiatric: Normal judgment and insight. Alert and oriented x 3. Normal mood.     Labs on Admission: I have personally reviewed following labs and imaging studies  CBC: Recent Labs  Lab 05/19/19 0816  WBC 7.1  NEUTROABS 5.6  HGB 12.5  HCT 37.2  MCV 88.8  PLT [id number]   Basic Metabolic Panel: Recent Labs  Lab 05/19/19 0816  NA 133*  K 3.7  CL 95*  CO2 25  GLUCOSE 413*  BUN 21  CREATININE 0.78  CALCIUM 9.9   GFR: CrCl cannot be calculated (Unknown ideal weight.). Liver Function Tests: Recent Labs  Lab 05/19/19 0816  AST 16  ALT 15  ALKPHOS 107  BILITOT 1.2  PROT 7.8  ALBUMIN 3.3*   No results for input(s): LIPASE, AMYLASE in the last 168  hours. No results for input(s): AMMONIA in the last 168 hours. Coagulation Profile: No results for input(s): INR, PROTIME in the last 168 hours. Cardiac Enzymes: No results for input(s): CKTOTAL, CKMB, CKMBINDEX, TROPONINI in the last 168 hours. BNP (last 3 results) No results for input(s): PROBNP in the last 8760 hours. HbA1C: No results for input(s): HGBA1C in the last 72 hours. CBG: Recent Labs  Lab 05/19/19 0840  GLUCAP 382*   Lipid Profile: No results for input(s): CHOL, HDL, LDLCALC, TRIG, CHOLHDL, LDLDIRECT in the last 72 hours. Thyroid Function Tests: No results for input(s): TSH, T4TOTAL, FREET4, T3FREE, THYROIDAB in the last 72 hours. Anemia Panel: No results for input(s): VITAMINB12, FOLATE, FERRITIN, TIBC, IRON, RETICCTPCT in the last 72 hours. Urine analysis:    Component Value Date/Time   COLORURINE YELLOW 05/19/2019 1136   APPEARANCEUR CLEAR 05/19/2019 1136   LABSPEC 1.017 05/19/2019 1136   PHURINE 7.0 05/19/2019 1136  GLUCOSEU >=500 (A) 05/19/2019 1136   HGBUR SMALL (A) 05/19/2019 1136   BILIRUBINUR NEGATIVE 05/19/2019 1136   BILIRUBINUR neg 07/01/2014 1319   KETONESUR NEGATIVE 05/19/2019 1136   PROTEINUR >=300 (A) 05/19/2019 1136   UROBILINOGEN 0.2 07/01/2014 1319   UROBILINOGEN 0.2 01/19/2011 0921   NITRITE NEGATIVE 05/19/2019 1136   LEUKOCYTESUR NEGATIVE 05/19/2019 1136   Sepsis Labs: No results found for this or any previous visit (from the past 240 hour(s)).   Radiological Exams on Admission: Dg Ribs Unilateral W/chest Left  Result Date: 05/18/2019 CLINICAL DATA:  [place] yesterday.  Left-sided pain. EXAM: LEFT RIBS AND CHEST - 3+ VIEW COMPARISON:  10/06/2018 FINDINGS: Multiple lower left rib fractures. Fracture of the lateral ribs number 5, 6 and 7. Fracture of the posterior ribs numbers 10 and 11. No evidence of pneumothorax or hemothorax. IMPRESSION: Multiple left rib fractures. Lateral fractures numbers 5 through 7. Posterior fractures numbers 10  and 11. Electronically Signed   By: Nelson Chimes M.D.   On: 05/18/2019 21:48   Dg Pelvis 1-2 Views  Result Date: 05/18/2019 CLINICAL DATA:  [place] yesterday. Pain. EXAM: PELVIS - 1-2 VIEW COMPARISON:  None. FINDINGS: Question inferior ramus fracture on the left. No other pelvic ring disruption identified or suspected. IMPRESSION: Suspicion of inferior ramus fracture on the left. Electronically Signed   By: Nelson Chimes M.D.   On: 05/18/2019 21:44   Ct Head Wo Contrast  Result Date: 05/19/2019 CLINICAL DATA:  Pain after fall EXAM: CT HEAD WITHOUT CONTRAST TECHNIQUE: Contiguous axial images were obtained from the base of the skull through the vertex without intravenous contrast. COMPARISON:  01/19/2011 FINDINGS: Brain: No evidence of acute infarction, hemorrhage, hydrocephalus, extra-axial collection or mass lesion/mass effect. Vascular: Mild atherosclerotic calcifications involving the large vessels of the skull base. No unexpected hyperdense vessel. Skull: Normal. Negative for fracture or focal lesion. Sinuses/Orbits: Unchanged left ethmoid osteoma. Paranasal sinuses and mastoid air cells are clear. Orbital structures unremarkable. Other: None. IMPRESSION: No acute intracranial abnormality. Electronically Signed   By: Davina Poke M.D.   On: 05/19/2019 09:04   Ct Chest W Contrast  Result Date: 05/19/2019 CLINICAL DATA:  [place] yesterday. Left-sided abdominal and flank pain. EXAM: CT CHEST, ABDOMEN, AND PELVIS WITH CONTRAST TECHNIQUE: Multidetector CT imaging of the chest, abdomen and pelvis was performed following the standard protocol during bolus administration of intravenous contrast. CONTRAST:  120mL OMNIPAQUE IOHEXOL 300 MG/ML  SOLN COMPARISON:  CT scan 09/16/2015 FINDINGS: CT CHEST FINDINGS Cardiovascular: The heart is normal in size. No pericardial effusion. The aorta is normal in caliber. There is moderate tortuosity and atherosclerotic calcification. No dissection. The branch vessels are  patent. Scattered atherosclerotic calcifications. Scattered coronary artery calcifications are also noted. The pulmonary arteries appear normal. Mediastinum/Nodes: No mediastinal or hilar mass or adenopathy or hematoma. Lungs/Pleura: No acute pulmonary findings. No pulmonary contusion, pleural effusion or pneumothorax. Dependent bibasilar atelectasis is noted. There are underlying emphysematous changes and pulmonary scarring. No worrisome pulmonary lesions. Musculoskeletal: No breast masses, supraclavicular or axillary adenopathy. The bony thorax is intact. No definite sternal fractures. The thoracic vertebral bodies are normally aligned. No acute fracture. There are left lower rib fractures. There is a nondisplaced eighth lateral rib fracture and nondisplaced eleventh and twelfth posterior rib fractures. CT ABDOMEN PELVIS FINDINGS Hepatobiliary: No focal hepatic lesions or acute hepatic injury. No perihepatic fluid collections. The portal and hepatic veins are patent. The gallbladder is grossly normal. No common bile duct dilatation. Pancreas: No mass, inflammation or ductal dilatation.  Moderate atrophy of the pancreatic body and tail regions. No acute pancreatic injury or peripancreatic fluid collection. Spleen: Normal size.  No focal lesions. Adrenals/Urinary Tract: The adrenal glands and kidneys are unremarkable. Small scattered renal cysts are noted. Mild renal cortical scarring changes. No collecting system abnormalities. The bladder appears normal. Stomach/Bowel: The stomach, duodenum and small bowel are unremarkable. No acute inflammatory changes, mass lesions or obstructive findings. There is a large amount of stool throughout the colon and down into the rectum suggesting constipation. No inflammatory changes or mass lesions. Vascular/Lymphatic: Moderate tortuosity and calcification of the abdominal aorta and iliac arteries but no aneurysm or dissection. The branch vessels are patent. The major venous  structures are patent. No mesenteric or retroperitoneal mass or adenopathy. No hematoma. Reproductive: The uterus and ovaries are unremarkable. Other: No free pelvic fluid collections or pelvic hematoma. Musculoskeletal: The bony structures are intact. No acute fractures are identified. The hips are normally located. The pubic symphysis and SI joints are intact. 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 Cardiovascular: The heart is normal in size. IMPRESSION: 1. Left lower rib fractures likely accounting for the patient's left flank and side pain. No significant displacement and no associated pneumothorax or pleural hematoma. 2. Dependent bibasilar atelectasis but no worrisome pulmonary findings. 3. Normal appearance of the heart and great vessels. No mediastinal hematoma. 4. No acute intra-abdominal/intrapelvic abnormality. No free air or free fluid and the solid abdominal organs are intact. 5. Large amount of stool throughout the colon suggesting constipation. 6. Intact bony pelvis. Electronically Signed   By: Marijo Sanes M.D.   On: 05/19/2019 13:25   Ct Abdomen Pelvis W Contrast  Result Date: 05/19/2019 CLINICAL DATA:  [place] yesterday. Left-sided abdominal and flank pain. EXAM: CT CHEST, ABDOMEN, AND PELVIS WITH CONTRAST TECHNIQUE: Multidetector CT imaging of the chest, abdomen and pelvis was performed following the standard protocol during bolus administration of intravenous contrast. CONTRAST:  12mL OMNIPAQUE IOHEXOL 300 MG/ML  SOLN COMPARISON:  CT scan 09/16/2015 FINDINGS: CT CHEST FINDINGS Cardiovascular: The heart is normal in size. No pericardial effusion. The aorta is normal in caliber. There is moderate tortuosity and atherosclerotic calcification. No dissection. The branch vessels are patent. Scattered atherosclerotic calcifications. Scattered coronary artery  calcifications are also noted. The pulmonary arteries appear normal. Mediastinum/Nodes: No mediastinal or hilar mass or adenopathy or hematoma. Lungs/Pleura: No acute pulmonary findings. No pulmonary contusion, pleural effusion or pneumothorax. Dependent bibasilar atelectasis is noted. There are underlying emphysematous changes and pulmonary scarring. No worrisome pulmonary lesions. Musculoskeletal: No breast masses, supraclavicular or axillary adenopathy. The bony thorax is intact. No definite sternal fractures. The thoracic vertebral bodies are normally aligned. No acute fracture. There are left lower rib fractures. There is a nondisplaced eighth lateral rib fracture and nondisplaced eleventh and twelfth posterior rib fractures. CT ABDOMEN PELVIS FINDINGS Hepatobiliary: No focal hepatic lesions or acute hepatic injury. No perihepatic fluid collections. The portal and hepatic veins are patent. The gallbladder is grossly normal. No common bile duct dilatation. Pancreas: No mass, inflammation or ductal dilatation. Moderate atrophy of the pancreatic body and tail regions. No acute pancreatic injury or peripancreatic fluid collection. Spleen: Normal size.  No focal lesions. Adrenals/Urinary Tract: The adrenal glands and kidneys are unremarkable. Small scattered renal cysts are noted. Mild renal cortical scarring changes. No collecting system abnormalities. The bladder appears normal. Stomach/Bowel: The stomach, duodenum and small  bowel are unremarkable. No acute inflammatory changes, mass lesions or obstructive findings. There is a large amount of stool throughout the colon and down into the rectum suggesting constipation. No inflammatory changes or mass lesions. Vascular/Lymphatic: Moderate tortuosity and calcification of the abdominal aorta and iliac arteries but no aneurysm or dissection. The branch vessels are patent. The major venous structures are patent. No mesenteric or retroperitoneal mass or adenopathy. No  hematoma. Reproductive: The uterus and ovaries are unremarkable. Other: No free pelvic fluid collections or pelvic hematoma. Musculoskeletal: The bony structures are intact. No acute fractures are identified. The hips are normally located. The pubic symphysis and SI joints are intact. 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 Cardiovascular: The heart is normal in size. IMPRESSION: 1. Left lower rib fractures likely accounting for the patient's left flank and side pain. No significant displacement and no associated pneumothorax or pleural hematoma. 2. Dependent bibasilar atelectasis but no worrisome pulmonary findings. 3. Normal appearance of the heart and great vessels. No mediastinal hematoma. 4. No acute intra-abdominal/intrapelvic abnormality. No free air or free fluid and the solid abdominal organs are intact. 5. Large amount of stool throughout the colon suggesting constipation. 6. Intact bony pelvis. Electronically Signed   By: Marijo Sanes M.D.   On: 05/19/2019 13:25   Dg Knee Complete 4 Views Left  Result Date: 05/18/2019 CLINICAL DATA:  [place] yesterday. Knee pain. EXAM: LEFT KNEE - COMPLETE 4+ VIEW COMPARISON:  None. FINDINGS: No evidence of fracture or joint effusion. 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 IMPRESSION: No acute or traumatic finding. 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 Electronically Signed   By: Nelson Chimes M.D.   On: 05/18/2019 21:40   Dg Femur Min 2 Views Left  Result Date: 05/18/2019 CLINICAL DATA:  [place] yesterday.  Pain. EXAM: LEFT FEMUR 2 VIEWS COMPARISON:  None. FINDINGS: There is no evidence of fracture or other focal bone lesions. Soft tissues are unremarkable. IMPRESSION: Negative. Electronically Signed   By: Nelson Chimes M.D.   On: 05/18/2019 21:41    EKG: Independently reviewed.   Sinus rhythm 80 bpm with the first-degree heart block  Assessment/Plan Multiple rib fracture secondary to fall: Patient reportedly fell 2 days ago and presented with complaints of left side pain.  Imaging studies revealed fractures of the left lateral ribs 5-7 and posterior ribs 10-11.  Patient's exact cause of her fall is not totally clear, but review of records shows that patient has a history of vertigo and syncope. -Admit to a medical telemetry bed -Incentive spirometry every hour while awake -Pain control -PT/OT to evaluate and treat  -Follow-up telemetry overnight -Appreciate trauma surgery consultative services  Diabetes mellitus type 2, uncontrolled with hyperglycemia: Patient presents with blood sugar elevated at 413 without elevated anion gap.  Patient has pseudohyponatremia of 133 secondary to hyperglycemia. Home regimen includes Metformin and 20 units of Lantus nightly.  Last hemoglobin A1c on file was 14.6 in 06/2018. -Hypoglycemic protocol -Check hemoglobin A1c in a.m. -Continue Lantus 20 units nightly -CBGs with meals utilizing a moderate SSI  Hypertensive urgency: On admission blood pressures elevated up to 194/90.  Patient's home medications include amlodipine 10 mg daily and lisinopril 5 mg daily. -Continue amlodipine and lisinopril  -Labetalol IV as needed  Hyperlipidemia -Continue atorvastatin  DVT prophylaxis: lovenox Code Status: Full  Family Communication: Discussed plan of care with the patient  and her daughter present at bedside Disposition Plan: Likely discharge home in a.m. Consults called: none  Admission status: observation  Norval Morton MD [facility] Pager [phone]   If 7PM-7AM, please contact night-coverage [email or web address] Password [email or web address]  05/19/2019, 4:16 PM

## 2019-05-19 NOTE — Progress Notes (Addendum)
Patient tested positive for COVID-19, but maintaining o2 sats on room air. Focused Covid order set was utilized. The fall may have been linked to patient having Covid.

## 2019-05-19 NOTE — ED Notes (Signed)
Per interpreter in room, pt does c/o increased pain.  significan instructions given to instruct patient how to use a pure-wick.  Pt was successful in obtaining urine sample.  Attempt to do in and out cath with inability to see the urethra.  Pt tolerated well and peri area cleansed prior to ur wick sample.  Pt POC reviewed that she may possibly be admitted and she cries that she wants to go home.  Will have provider review POC with pt and family contact.  Linens and gown changed, pt drowsy after Fentanyl IV and placed on 2L [place].  Rousable to increased rest.

## 2019-05-19 NOTE — Consult Note (Signed)
Emily Hoover 11/22/1945  [id number].    Requesting MD: Dr. Varney Biles Chief Complaint/Reason for Consult: fall, rib fractures  HPI:  This is a 73 yo Hispanic female who lives with her son.  She has a history of DM that she states she takes medicines for but is unable to tell me what her blood sugars run at home.  She was at home yesterday by herself while her son works and fell on the ground.  A translator is used to get her history, but the patient isn't a great historian with details.  She denies tripping, but is unclear about why she fell.  She denies syncope.  She hit her left chest, but denies hitting her head or LOC.  Nothing else hurts her.  She came to the ED today for evaluation as she wasn't feeling well.  She denies SOB or difficulty breathing.  She is sating 98 % on RA.  After work up she is noted to have elevated blood sugars over 350 as well as rib fractures 8, 11, 12 on the left.  No other acute traumatic injuries were found.  We have been asked to consult for her rib fractures.  ROS: ROS: Please see HPI, otherwise all other systems have been reviewed and are negative.  Family History  Family history unknown: Yes  Problem Relation Age of Onset  . Family history unknown: Yes  . CAD Mother   . CAD Father   . Hypertension Father   . Diabetes Neg Hx   . Cancer Neg Hx     Past Medical History:  Diagnosis Date  . Diabetes mellitus   . Diabetes mellitus without complication ([place])   . Hypercholesteremia   . Hypertension   . Renal insufficiency     Past Surgical History:  Procedure Laterality Date  . TUBAL LIGATION      Social History:  reports that she has never smoked. She has never used smokeless tobacco. She reports that she does not drink alcohol or use drugs.  Allergies: No Known Allergies  (Not in a hospital admission)    Physical Exam: Blood pressure (!) 179/85, pulse 76, temperature 97.8 F (36.6 C), temperature source Oral, resp. rate 20,  SpO2 100 %. General: elderly Hispanic female who is laying in bed in NAD HEENT: head is normocephalic, atraumatic.  Sclera are noninjected.  PERRL.  Ears and nose without any masses or lesions.  Mouth is pink and moist.  Neck is nontender over midline with good ROM Heart: regular, rate, and rhythm.  Normal s1,s2. No obvious murmurs, gallops, or rubs noted.  Palpable radial and pedal pulses bilaterally Lungs: CTAB, no wheezes, rhonchi, or rales noted.  Respiratory effort nonlabored on RA with sats around 98-100%.  She is tender on the left lower lateral aspect of her chest Abd: soft, NT, ND, +BS, no masses, hernias, or organomegaly MS: all 4 extremities are symmetrical with no cyanosis, clubbing, or edema.  Normal ROM of all extremities Skin: warm and dry with no masses, lesions, or rashes Psych: A&Ox3 with an appropriate affect.   Results for orders placed or performed during the hospital encounter of 05/18/19 (from the past 48 hour(s))  CBC with Differential     Status: None   Collection Time: 05/19/19  8:16 AM  Result Value Ref Range   WBC 7.1 4.0 - 10.5 K/uL   RBC 4.19 3.87 - 5.11 MIL/uL   Hemoglobin 12.5 12.0 - 15.0 g/dL   HCT 37.2 36.0 -  46.0 %   MCV 88.8 80.0 - 100.0 fL   MCH 29.8 26.0 - 34.0 pg   MCHC 33.6 30.0 - 36.0 g/dL   RDW 13.8 11.5 - 15.5 %   Platelets 389 150 - 400 K/uL   nRBC 0.0 0.0 - 0.2 %   Neutrophils Relative % 78 %   Neutro Abs 5.6 1.7 - 7.7 K/uL   Lymphocytes Relative 15 %   Lymphs Abs 1.0 0.7 - 4.0 K/uL   Monocytes Relative 6 %   Monocytes Absolute 0.4 0.1 - 1.0 K/uL   Eosinophils Relative 0 %   Eosinophils Absolute 0.0 0.0 - 0.5 K/uL   Basophils Relative 1 %   Basophils Absolute 0.1 0.0 - 0.1 K/uL   Immature Granulocytes 0 %   Abs Immature Granulocytes 0.02 0.00 - 0.07 K/uL    Comment: Performed at [facility] [address] [place], [place] [address]  Comprehensive metabolic panel     Status: Abnormal   Collection Time: 05/19/19  8:16 AM   Result Value Ref Range   Sodium 133 (L) 135 - 145 mmol/L   Potassium 3.7 3.5 - 5.1 mmol/L   Chloride 95 (L) 98 - 111 mmol/L   CO2 25 22 - 32 mmol/L   Glucose, Bld 413 (H) 70 - 99 mg/dL   BUN 21 8 - 23 mg/dL   Creatinine, Ser 0.78 0.44 - 1.00 mg/dL   Calcium 9.9 8.9 - 10.3 mg/dL   Total Protein 7.8 6.5 - 8.1 g/dL   Albumin 3.3 (L) 3.5 - 5.0 g/dL   AST 16 15 - 41 U/L   ALT 15 0 - 44 U/L   Alkaline Phosphatase 107 38 - 126 U/L   Total Bilirubin 1.2 0.3 - 1.2 mg/dL   GFR calc non Af Amer >60 >60 mL/min   GFR calc Af Amer >60 >60 mL/min   Anion gap 13 5 - 15    Comment: Performed at [facility], [place] [address] [place], [place] [address]  POC CBG, ED     Status: Abnormal   Collection Time: 05/19/19  8:40 AM  Result Value Ref Range   Glucose-Capillary 382 (H) 70 - 99 mg/dL  Urinalysis, Routine w reflex microscopic     Status: Abnormal   Collection Time: 05/19/19 11:36 AM  Result Value Ref Range   Color, Urine YELLOW YELLOW   APPearance CLEAR CLEAR   Specific Gravity, Urine 1.017 1.005 - 1.030   pH 7.0 5.0 - 8.0   Glucose, UA >=500 (A) NEGATIVE mg/dL   Hgb urine dipstick SMALL (A) NEGATIVE   Bilirubin Urine NEGATIVE NEGATIVE   Ketones, ur NEGATIVE NEGATIVE mg/dL   Protein, ur >=300 (A) NEGATIVE mg/dL   Nitrite NEGATIVE NEGATIVE   Leukocytes,Ua NEGATIVE NEGATIVE   RBC / HPF 0-5 0 - 5 RBC/hpf   WBC, UA 0-5 0 - 5 WBC/hpf   Bacteria, UA NONE SEEN NONE SEEN   Hyaline Casts, UA PRESENT     Comment: Performed at [facility], [place] [address], [place], [place] [address]   Dg Ribs Unilateral W/chest Left  Result Date: 05/18/2019 CLINICAL DATA:  [place] yesterday.  Left-sided pain. EXAM: LEFT RIBS AND CHEST - 3+ VIEW COMPARISON:  10/06/2018 FINDINGS: Multiple lower left rib fractures. Fracture of the lateral ribs number 5, 6 and 7. Fracture of the posterior ribs numbers 10 and 11. No evidence of pneumothorax or hemothorax. IMPRESSION: Multiple left rib fractures. Lateral  fractures numbers 5  through 7. Posterior fractures numbers 10 and 11. Electronically Signed   By: Nelson Chimes M.D.   On: 05/18/2019 21:48   Dg Pelvis 1-2 Views  Result Date: 05/18/2019 CLINICAL DATA:  [place] yesterday. Pain. EXAM: PELVIS - 1-2 VIEW COMPARISON:  None. FINDINGS: Question inferior ramus fracture on the left. No other pelvic ring disruption identified or suspected. IMPRESSION: Suspicion of inferior ramus fracture on the left. Electronically Signed   By: Nelson Chimes M.D.   On: 05/18/2019 21:44   Ct Head Wo Contrast  Result Date: 05/19/2019 CLINICAL DATA:  Pain after fall EXAM: CT HEAD WITHOUT CONTRAST TECHNIQUE: Contiguous axial images were obtained from the base of the skull through the vertex without intravenous contrast. COMPARISON:  01/19/2011 FINDINGS: Brain: No evidence of acute infarction, hemorrhage, hydrocephalus, extra-axial collection or mass lesion/mass effect. Vascular: Mild atherosclerotic calcifications involving the large vessels of the skull base. No unexpected hyperdense vessel. Skull: Normal. Negative for fracture or focal lesion. Sinuses/Orbits: Unchanged left ethmoid osteoma. Paranasal sinuses and mastoid air cells are clear. Orbital structures unremarkable. Other: None. IMPRESSION: No acute intracranial abnormality. Electronically Signed   By: Davina Poke M.D.   On: 05/19/2019 09:04   Ct Chest W Contrast  Result Date: 05/19/2019 CLINICAL DATA:  [place] yesterday. Left-sided abdominal and flank pain. EXAM: CT CHEST, ABDOMEN, AND PELVIS WITH CONTRAST TECHNIQUE: Multidetector CT imaging of the chest, abdomen and pelvis was performed following the standard protocol during bolus administration of intravenous contrast. CONTRAST:  134mL OMNIPAQUE IOHEXOL 300 MG/ML  SOLN COMPARISON:  CT scan 09/16/2015 FINDINGS: CT CHEST FINDINGS Cardiovascular: The heart is normal in size. No pericardial effusion. The aorta is normal in caliber. There is moderate tortuosity and  atherosclerotic calcification. No dissection. The branch vessels are patent. Scattered atherosclerotic calcifications. Scattered coronary artery calcifications are also noted. The pulmonary arteries appear normal. Mediastinum/Nodes: No mediastinal or hilar mass or adenopathy or hematoma. Lungs/Pleura: No acute pulmonary findings. No pulmonary contusion, pleural effusion or pneumothorax. Dependent bibasilar atelectasis is noted. There are underlying emphysematous changes and pulmonary scarring. No worrisome pulmonary lesions. Musculoskeletal: No breast masses, supraclavicular or axillary adenopathy. The bony thorax is intact. No definite sternal fractures. The thoracic vertebral bodies are normally aligned. No acute fracture. There are left lower rib fractures. There is a nondisplaced eighth lateral rib fracture and nondisplaced eleventh and twelfth posterior rib fractures. CT ABDOMEN PELVIS FINDINGS Hepatobiliary: No focal hepatic lesions or acute hepatic injury. No perihepatic fluid collections. The portal and hepatic veins are patent. The gallbladder is grossly normal. No common bile duct dilatation. Pancreas: No mass, inflammation or ductal dilatation. Moderate atrophy of the pancreatic body and tail regions. No acute pancreatic injury or peripancreatic fluid collection. Spleen: Normal size.  No focal lesions. Adrenals/Urinary Tract: The adrenal glands and kidneys are unremarkable. Small scattered renal cysts are noted. Mild renal cortical scarring changes. No collecting system abnormalities. The bladder appears normal. Stomach/Bowel: The stomach, duodenum and small bowel are unremarkable. No acute inflammatory changes, mass lesions or obstructive findings. There is a large amount of stool throughout the colon and down into the rectum suggesting constipation. No inflammatory changes or mass lesions. Vascular/Lymphatic: Moderate tortuosity and calcification of the abdominal aorta and iliac arteries but no aneurysm  or dissection. The branch vessels are patent. The major venous structures are patent. No mesenteric or retroperitoneal mass or adenopathy. No hematoma. Reproductive: The uterus and ovaries are unremarkable. Other: No free pelvic fluid collections or pelvic hematoma. Musculoskeletal: The bony structures are intact. No  acute fractures are identified. The hips are normally located. The pubic symphysis and SI joints are intact. 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 Cardiovascular: The heart is normal in size. IMPRESSION: 1. Left lower rib fractures likely accounting for the patient's left flank and side pain. No significant displacement and no associated pneumothorax or pleural hematoma. 2. Dependent bibasilar atelectasis but no worrisome pulmonary findings. 3. Normal appearance of the heart and great vessels. No mediastinal hematoma. 4. No acute intra-abdominal/intrapelvic abnormality. No free air or free fluid and the solid abdominal organs are intact. 5. Large amount of stool throughout the colon suggesting constipation. 6. Intact bony pelvis. Electronically Signed   By: Marijo Sanes M.D.   On: 05/19/2019 13:25   Ct Abdomen Pelvis W Contrast  Result Date: 05/19/2019 CLINICAL DATA:  [place] yesterday. Left-sided abdominal and flank pain. EXAM: CT CHEST, ABDOMEN, AND PELVIS WITH CONTRAST TECHNIQUE: Multidetector CT imaging of the chest, abdomen and pelvis was performed following the standard protocol during bolus administration of intravenous contrast. CONTRAST:  146mL OMNIPAQUE IOHEXOL 300 MG/ML  SOLN COMPARISON:  CT scan 09/16/2015 FINDINGS: CT CHEST FINDINGS Cardiovascular: The heart is normal in size. No pericardial effusion. The aorta is normal in caliber. There is moderate tortuosity and atherosclerotic calcification. No dissection. The branch vessels are patent. Scattered  atherosclerotic calcifications. Scattered coronary artery calcifications are also noted. The pulmonary arteries appear normal. Mediastinum/Nodes: No mediastinal or hilar mass or adenopathy or hematoma. Lungs/Pleura: No acute pulmonary findings. No pulmonary contusion, pleural effusion or pneumothorax. Dependent bibasilar atelectasis is noted. There are underlying emphysematous changes and pulmonary scarring. No worrisome pulmonary lesions. Musculoskeletal: No breast masses, supraclavicular or axillary adenopathy. The bony thorax is intact. No definite sternal fractures. The thoracic vertebral bodies are normally aligned. No acute fracture. There are left lower rib fractures. There is a nondisplaced eighth lateral rib fracture and nondisplaced eleventh and twelfth posterior rib fractures. CT ABDOMEN PELVIS FINDINGS Hepatobiliary: No focal hepatic lesions or acute hepatic injury. No perihepatic fluid collections. The portal and hepatic veins are patent. The gallbladder is grossly normal. No common bile duct dilatation. Pancreas: No mass, inflammation or ductal dilatation. Moderate atrophy of the pancreatic body and tail regions. No acute pancreatic injury or peripancreatic fluid collection. Spleen: Normal size.  No focal lesions. Adrenals/Urinary Tract: The adrenal glands and kidneys are unremarkable. Small scattered renal cysts are noted. Mild renal cortical scarring changes. No collecting system abnormalities. The bladder appears normal. Stomach/Bowel: The stomach, duodenum and small bowel are unremarkable. No acute inflammatory changes, mass lesions or obstructive findings. There is a large amount of stool throughout the colon and down into the rectum suggesting constipation. No inflammatory changes or mass lesions. Vascular/Lymphatic: Moderate tortuosity and calcification of the abdominal aorta and iliac arteries but no aneurysm or dissection. The branch vessels are patent. The major venous structures are patent.  No mesenteric or retroperitoneal mass or adenopathy. No hematoma. Reproductive: The uterus and ovaries are unremarkable. Other: No free pelvic fluid collections or pelvic hematoma. Musculoskeletal: The bony structures are intact. No acute fractures are identified. The hips are normally located. The pubic symphysis and SI joints are intact. 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 Cardiovascular: The heart is normal in size. IMPRESSION: 1. Left lower rib fractures likely  accounting for the patient's left flank and side pain. No significant displacement and no associated pneumothorax or pleural hematoma. 2. Dependent bibasilar atelectasis but no worrisome pulmonary findings. 3. Normal appearance of the heart and great vessels. No mediastinal hematoma. 4. No acute intra-abdominal/intrapelvic abnormality. No free air or free fluid and the solid abdominal organs are intact. 5. Large amount of stool throughout the colon suggesting constipation. 6. Intact bony pelvis. Electronically Signed   By: Marijo Sanes M.D.   On: 05/19/2019 13:25   Dg Knee Complete 4 Views Left  Result Date: 05/18/2019 CLINICAL DATA:  [place] yesterday. Knee pain. EXAM: LEFT KNEE - COMPLETE 4+ VIEW COMPARISON:  None. FINDINGS: No evidence of fracture or joint effusion. 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 IMPRESSION: No acute or traumatic finding. 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 Electronically Signed   By: Nelson Chimes M.D.   On: 05/18/2019 21:40   Dg Femur Min 2 Views Left  Result Date: 05/18/2019 CLINICAL DATA:  [place] yesterday.  Pain. EXAM: LEFT FEMUR 2 VIEWS COMPARISON:  None. FINDINGS: There is no evidence of fracture or other focal bone lesions. Soft tissues are unremarkable. IMPRESSION: Negative. Electronically Signed   By: Nelson Chimes M.D.   On:  05/18/2019 21:41      Assessment/Plan Ground level fall Uncontrolled DM - CBG over 350 with glucose in urine over 500 and protein over 300. Will defer to medicine and ask for medical admission for management of this problem.  It is possible this could have been an etiology of her fall since she states she thinks she didn't trip. Left rib FXs 8, 11, 12 - IS, pulm toilet, pain control.  Patient is having very little pain right now and sating well.  Tylenol and tramadol have been ordered.  PT/OT eval FEN - may have diet from our standpoint VTE - may have DVT prophylaxis from our standpoint  ID - none needed  Henreitta Cea, [facility] Surgery 05/19/2019, 3:13 PM Please see Amion for pager number during day hours 7:00am-4:30pm

## 2019-05-19 NOTE — ED Provider Notes (Signed)
[facility] EMERGENCY DEPARTMENT Provider Note   CSN: [id number] Arrival date & time: 05/18/19  1955     History   Chief Complaint No chief complaint on file.   HPI Emily Hoover is a 73 y.o. female.     73 y.o female with a PMH of HTN, DM, Myositis presents to the ED s/p fall x 2 days ago. Patient reports she was ambulating to her home when she felt like falling to the left side, she reports landing mostly on the left side of her body around her rib region. Today she endorses pain throughout the whole left side of her left upper quadrant, states the pain is worse with inspiration along with ambulation. She has not taken any medication for relieve in symptoms. She reports not striking her head, and did not lose consciousness. She denies any dizziness, weakness, headache, shortness of breath.   The history is provided by the patient.    Past Medical History:  Diagnosis Date   Diabetes mellitus    Diabetes mellitus without complication ([place])    Hypercholesteremia    Hypertension    Renal insufficiency     Patient Active Problem List   Diagnosis Date Noted   Non-compliance 07/07/2018   Syncope 11/18/2017   Hypothermia 11/18/2017   Hypercholesteremia 10/30/2017   Myositis 10/13/2015   Renal insufficiency 10/10/2015   Staphylococcus aureus bacteremia 09/19/2015   UTI (lower urinary tract infection) 09/16/2015   Hip pain 09/16/2015   Hypertension 09/16/2015   Pain management 09/16/2015   Vertigo 09/07/2015   Labyrinthitis 09/07/2015   Neck pain 09/07/2015   Diabetes mellitus without complication ([place]) [id number]   Labyrinthitis of right ear    Chronic neck pain    Uncontrollable vomiting    Diabetes ([place]) 06/30/2013    Past Surgical History:  Procedure Laterality Date   TUBAL LIGATION       OB History    Gravida  0   Para  0   Term  0   Preterm  0   AB  0   Living        SAB  0   TAB  0   Ectopic  0   Multiple      Live Births               Home Medications    Prior to Admission medications   Medication Sig Start Date End Date Taking? Authorizing Provider  amLODipine (NORVASC) 10 MG tablet Take 1 tablet (10 mg total) by mouth daily. 04/29/19   Charlott Rakes, MD  atorvastatin (LIPITOR) 20 MG tablet Take 1 tablet (20 mg total) by mouth daily. 04/29/19   Charlott Rakes, MD  benzonatate (TESSALON) 100 MG capsule Take 1 capsule (100 mg total) by mouth every 8 (eight) hours. 10/06/18   Carlisle Cater, PA-C  Blood Glucose Monitoring Suppl (TRUE METRIX METER) DEVI 1 each by Does not apply route 3 (three) times daily. 07/07/18   Charlott Rakes, MD  glucose blood (TRUE METRIX BLOOD GLUCOSE TEST) test strip 1 each by Other route 3 (three) times daily. 07/07/18   Charlott Rakes, MD  insulin glargine (LANTUS) 100 UNIT/ML injection Inject 0.2 mLs (20 Units total) into the skin at bedtime. 04/29/19   Charlott Rakes, MD  Insulin Syringe-Needle U-100 (INSULIN SYRINGE 1CC/30GX5/16") 30G X 5/16" 1 ML MISC Check blood sugar TID & QHS 10/10/15   Langeland, Dawn T, MD  lisinopril (ZESTRIL) 5 MG tablet Take 1 tablet (5 mg total) by  mouth daily. 04/29/19   Charlott Rakes, MD  metFORMIN (GLUCOPHAGE) 500 MG tablet Take 2 tablets (1,000 mg total) by mouth 2 (two) times daily with a meal. 04/29/19   Charlott Rakes, MD  TRUEPLUS LANCETS 28G MISC 1 each by Does not apply route 3 (three) times daily. 07/07/18   Charlott Rakes, MD    Family History Family History  Family history unknown: Yes  Problem Relation Age of Onset   Family history unknown: Yes   CAD Mother    CAD Father    Hypertension Father    Diabetes Neg Hx    Cancer Neg Hx     Social History Social History   Tobacco Use   Smoking status: Never Smoker   Smokeless tobacco: Never Used  Substance Use Topics   Alcohol use: No   Drug use: No     Allergies   Patient has no known allergies.   Review of Systems Review of  Systems  Constitutional: Negative for fever.  HENT: Negative for sinus pressure.   Respiratory: Negative for shortness of breath.   Gastrointestinal: Negative for abdominal pain, diarrhea, nausea and vomiting.  Genitourinary: Negative for flank pain.  Musculoskeletal: Positive for myalgias. Negative for back pain.  Skin: Negative for pallor and wound.  Neurological: Negative for seizures, syncope, weakness and light-headedness.     Physical Exam Updated Vital Signs BP (!) 179/85    Pulse 76    Temp 97.8 F (36.6 C) (Oral)    Resp 20    SpO2 100%   Physical Exam Vitals signs and nursing note reviewed.  Constitutional:      Appearance: Normal appearance.     Comments: Disheveled in appearance.   HENT:     Head: Normocephalic and atraumatic.     Mouth/Throat:     Mouth: Mucous membranes are dry.  Eyes:     Pupils: Pupils are equal, round, and reactive to light.  Neck:     Musculoskeletal: Normal range of motion and neck supple.  Cardiovascular:     Rate and Rhythm: Normal rate.  Pulmonary:     Effort: Pulmonary effort is normal.     Breath sounds: Normal breath sounds. No wheezing or rales.  Chest:     Chest wall: No deformity or tenderness.       Comments: TTP along the left upper quadrant. No visible hematoma.  Abdominal:     General: Abdomen is flat. There is no distension.     Tenderness: There is no abdominal tenderness. There is no right CVA tenderness or left CVA tenderness.     Comments: Abdomen is soft, non tender to palpation. Bowel sounds are present.   Skin:    General: Skin is warm and dry.  Neurological:     Mental Status: She is alert and oriented to person, place, and time. Mental status is at baseline.      ED Treatments / Results  Labs (all labs ordered are listed, but only abnormal results are displayed) Labs Reviewed  COMPREHENSIVE METABOLIC PANEL - Abnormal; Notable for the following components:      Result Value   Sodium 133 (*)     Chloride 95 (*)    Glucose, Bld 413 (*)    Albumin 3.3 (*)    All other components within normal limits  URINALYSIS, ROUTINE W REFLEX MICROSCOPIC - Abnormal; Notable for the following components:   Glucose, UA >=500 (*)    Hgb urine dipstick SMALL (*)    Protein,  ur >=300 (*)    All other components within normal limits  CBG MONITORING, ED - Abnormal; Notable for the following components:   Glucose-Capillary 382 (*)    All other components within normal limits  SARS CORONAVIRUS 2 (TAT 6-24 HRS)  CBC WITH DIFFERENTIAL/PLATELET    EKG None  Radiology Dg Ribs Unilateral W/chest Left  Result Date: 05/18/2019 CLINICAL DATA:  [place] yesterday.  Left-sided pain. EXAM: LEFT RIBS AND CHEST - 3+ VIEW COMPARISON:  10/06/2018 FINDINGS: Multiple lower left rib fractures. Fracture of the lateral ribs number 5, 6 and 7. Fracture of the posterior ribs numbers 10 and 11. No evidence of pneumothorax or hemothorax. IMPRESSION: Multiple left rib fractures. Lateral fractures numbers 5 through 7. Posterior fractures numbers 10 and 11. Electronically Signed   By: Nelson Chimes M.D.   On: 05/18/2019 21:48   Dg Pelvis 1-2 Views  Result Date: 05/18/2019 CLINICAL DATA:  [place] yesterday. Pain. EXAM: PELVIS - 1-2 VIEW COMPARISON:  None. FINDINGS: Question inferior ramus fracture on the left. No other pelvic ring disruption identified or suspected. IMPRESSION: Suspicion of inferior ramus fracture on the left. Electronically Signed   By: Nelson Chimes M.D.   On: 05/18/2019 21:44   Ct Head Wo Contrast  Result Date: 05/19/2019 CLINICAL DATA:  Pain after fall EXAM: CT HEAD WITHOUT CONTRAST TECHNIQUE: Contiguous axial images were obtained from the base of the skull through the vertex without intravenous contrast. COMPARISON:  01/19/2011 FINDINGS: Brain: No evidence of acute infarction, hemorrhage, hydrocephalus, extra-axial collection or mass lesion/mass effect. Vascular: Mild atherosclerotic calcifications involving the  large vessels of the skull base. No unexpected hyperdense vessel. Skull: Normal. Negative for fracture or focal lesion. Sinuses/Orbits: Unchanged left ethmoid osteoma. Paranasal sinuses and mastoid air cells are clear. Orbital structures unremarkable. Other: None. IMPRESSION: No acute intracranial abnormality. Electronically Signed   By: Davina Poke M.D.   On: 05/19/2019 09:04   Ct Chest W Contrast  Result Date: 05/19/2019 CLINICAL DATA:  [place] yesterday. Left-sided abdominal and flank pain. EXAM: CT CHEST, ABDOMEN, AND PELVIS WITH CONTRAST TECHNIQUE: Multidetector CT imaging of the chest, abdomen and pelvis was performed following the standard protocol during bolus administration of intravenous contrast. CONTRAST:  147mL OMNIPAQUE IOHEXOL 300 MG/ML  SOLN COMPARISON:  CT scan 09/16/2015 FINDINGS: CT CHEST FINDINGS Cardiovascular: The heart is normal in size. No pericardial effusion. The aorta is normal in caliber. There is moderate tortuosity and atherosclerotic calcification. No dissection. The branch vessels are patent. Scattered atherosclerotic calcifications. Scattered coronary artery calcifications are also noted. The pulmonary arteries appear normal. Mediastinum/Nodes: No mediastinal or hilar mass or adenopathy or hematoma. Lungs/Pleura: No acute pulmonary findings. No pulmonary contusion, pleural effusion or pneumothorax. Dependent bibasilar atelectasis is noted. There are underlying emphysematous changes and pulmonary scarring. No worrisome pulmonary lesions. Musculoskeletal: No breast masses, supraclavicular or axillary adenopathy. The bony thorax is intact. No definite sternal fractures. The thoracic vertebral bodies are normally aligned. No acute fracture. There are left lower rib fractures. There is a nondisplaced eighth lateral rib fracture and nondisplaced eleventh and twelfth posterior rib fractures. CT ABDOMEN PELVIS FINDINGS Hepatobiliary: No focal hepatic lesions or acute hepatic injury.  No perihepatic fluid collections. The portal and hepatic veins are patent. The gallbladder is grossly normal. No common bile duct dilatation. Pancreas: No mass, inflammation or ductal dilatation. Moderate atrophy of the pancreatic body and tail regions. No acute pancreatic injury or peripancreatic fluid collection. Spleen: Normal size.  No focal lesions. Adrenals/Urinary Tract: The adrenal glands and  kidneys are unremarkable. Small scattered renal cysts are noted. Mild renal cortical scarring changes. No collecting system abnormalities. The bladder appears normal. Stomach/Bowel: The stomach, duodenum and small bowel are unremarkable. No acute inflammatory changes, mass lesions or obstructive findings. There is a large amount of stool throughout the colon and down into the rectum suggesting constipation. No inflammatory changes or mass lesions. Vascular/Lymphatic: Moderate tortuosity and calcification of the abdominal aorta and iliac arteries but no aneurysm or dissection. The branch vessels are patent. The major venous structures are patent. No mesenteric or retroperitoneal mass or adenopathy. No hematoma. Reproductive: The uterus and ovaries are unremarkable. Other: No free pelvic fluid collections or pelvic hematoma. Musculoskeletal: The bony structures are intact. No acute fractures are identified. The hips are normally located. The pubic symphysis and SI joints are intact. 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 Cardiovascular: The heart is normal in size. IMPRESSION: 1. Left lower rib fractures likely accounting for the patient's left flank and side pain. No significant displacement and no associated pneumothorax or pleural hematoma. 2. Dependent bibasilar atelectasis but no worrisome pulmonary findings. 3. Normal appearance of the heart and great vessels. No mediastinal  hematoma. 4. No acute intra-abdominal/intrapelvic abnormality. No free air or free fluid and the solid abdominal organs are intact. 5. Large amount of stool throughout the colon suggesting constipation. 6. Intact bony pelvis. Electronically Signed   By: Marijo Sanes M.D.   On: 05/19/2019 13:25   Ct Abdomen Pelvis W Contrast  Result Date: 05/19/2019 CLINICAL DATA:  [place] yesterday. Left-sided abdominal and flank pain. EXAM: CT CHEST, ABDOMEN, AND PELVIS WITH CONTRAST TECHNIQUE: Multidetector CT imaging of the chest, abdomen and pelvis was performed following the standard protocol during bolus administration of intravenous contrast. CONTRAST:  159mL OMNIPAQUE IOHEXOL 300 MG/ML  SOLN COMPARISON:  CT scan 09/16/2015 FINDINGS: CT CHEST FINDINGS Cardiovascular: The heart is normal in size. No pericardial effusion. The aorta is normal in caliber. There is moderate tortuosity and atherosclerotic calcification. No dissection. The branch vessels are patent. Scattered atherosclerotic calcifications. Scattered coronary artery calcifications are also noted. The pulmonary arteries appear normal. Mediastinum/Nodes: No mediastinal or hilar mass or adenopathy or hematoma. Lungs/Pleura: No acute pulmonary findings. No pulmonary contusion, pleural effusion or pneumothorax. Dependent bibasilar atelectasis is noted. There are underlying emphysematous changes and pulmonary scarring. No worrisome pulmonary lesions. Musculoskeletal: No breast masses, supraclavicular or axillary adenopathy. The bony thorax is intact. No definite sternal fractures. The thoracic vertebral bodies are normally aligned. No acute fracture. There are left lower rib fractures. There is a nondisplaced eighth lateral rib fracture and nondisplaced eleventh and twelfth posterior rib fractures. CT ABDOMEN PELVIS FINDINGS Hepatobiliary: No focal hepatic lesions or acute hepatic injury. No perihepatic fluid collections. The portal and hepatic veins are patent. The  gallbladder is grossly normal. No common bile duct dilatation. Pancreas: No mass, inflammation or ductal dilatation. Moderate atrophy of the pancreatic body and tail regions. No acute pancreatic injury or peripancreatic fluid collection. Spleen: Normal size.  No focal lesions. Adrenals/Urinary Tract: The adrenal glands and kidneys are unremarkable. Small scattered renal cysts are noted. Mild renal cortical scarring changes. No collecting system abnormalities. The bladder appears normal. Stomach/Bowel: The stomach, duodenum and small bowel are unremarkable. No acute inflammatory changes, mass lesions or obstructive findings. There is a large amount of stool throughout the colon and down into the rectum suggesting constipation. No  inflammatory changes or mass lesions. Vascular/Lymphatic: Moderate tortuosity and calcification of the abdominal aorta and iliac arteries but no aneurysm or dissection. The branch vessels are patent. The major venous structures are patent. No mesenteric or retroperitoneal mass or adenopathy. No hematoma. Reproductive: The uterus and ovaries are unremarkable. Other: No free pelvic fluid collections or pelvic hematoma. Musculoskeletal: The bony structures are intact. No acute fractures are identified. The hips are normally located. The pubic symphysis and SI joints are intact. 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 Cardiovascular: The heart is normal in size. IMPRESSION: 1. Left lower rib fractures likely accounting for the patient's left flank and side pain. No significant displacement and no associated pneumothorax or pleural hematoma. 2. Dependent bibasilar atelectasis but no worrisome pulmonary findings. 3. Normal appearance of the heart and great vessels. No mediastinal hematoma. 4. No acute intra-abdominal/intrapelvic abnormality. No free air or free  fluid and the solid abdominal organs are intact. 5. Large amount of stool throughout the colon suggesting constipation. 6. Intact bony pelvis. Electronically Signed   By: Marijo Sanes M.D.   On: 05/19/2019 13:25   Dg Knee Complete 4 Views Left  Result Date: 05/18/2019 CLINICAL DATA:  [place] yesterday. Knee pain. EXAM: LEFT KNEE - COMPLETE 4+ VIEW COMPARISON:  None. FINDINGS: No evidence of fracture or joint effusion. 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 IMPRESSION: No acute or traumatic finding. 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 Electronically Signed   By: Nelson Chimes M.D.   On: 05/18/2019 21:40   Dg Femur Min 2 Views Left  Result Date: 05/18/2019 CLINICAL DATA:  [place] yesterday.  Pain. EXAM: LEFT FEMUR 2 VIEWS COMPARISON:  None. FINDINGS: There is no evidence of fracture or other focal bone lesions. Soft tissues are unremarkable. IMPRESSION: Negative. Electronically Signed   By: Nelson Chimes M.D.   On: 05/18/2019 21:41    Procedures Procedures (including critical care time)  Medications Ordered in ED Medications  acetaminophen (TYLENOL) tablet 1,000 mg (has no administration in time range)  traMADol (ULTRAM) tablet 50 mg (has no administration in time range)  sodium chloride 0.9 % bolus 500 mL (0 mLs Intravenous Stopped 05/19/19 0945)  fentaNYL (SUBLIMAZE) injection 50 mcg (50 mcg Intravenous Given 05/19/19 1107)  ondansetron (ZOFRAN) injection 4 mg (4 mg Intravenous Given 05/19/19 1200)  iohexol (OMNIPAQUE) 300 MG/ML solution 100 mL (100 mLs Intravenous Contrast Given 05/19/19 1300)     Initial Impression / Assessment and Plan / ED Course  I have reviewed the triage vital signs and the nursing notes.  Pertinent labs & imaging results that were available during my care of the patient were reviewed by me and considered in my medical decision making (see chart for details).       Patient with a past medical history of diabetes,  hypertension, myositis presents to the ED status post fall.  According to patient she was ambulating to her home when she suddenly felt like she was tilting to the left, landed from a standing position on her left ribs.  She reports pain along the left upper quadrant, left hip.   Patient was seen by me after spending 11 hours in the waiting room, she reports pain along the left upper quadrant, she is hemodynamically stable.  No visible hematoma, fractures or crepitus noted on my exam.  X-ray of her chest revealed: Multiple left rib fractures.  Lateral fractures numbers 5 through 7.  Posterior fractures numbers 10 and 11.     X-ray of her pelvis showed: Suspicion of inferior ramus fracture on the left.      A CT of her head was obtained as patient reports falling over, to rule out any intracranial process.  Her CT head was clear. BC without any acute abnormality, hemoglobin is within normal limits.  CMP with some mild hyponatremia, glucose is elevated, she has a history of diabetes at 413, no anion gap, doubt DKA at this time.  In-N-Out cath was obtained by nursing staff.  She was given fentanyl 50 mics for her pain, she did have one episode of yellow emesis, was given some Zofran IV.   Spoke to CT imaging, CT has been delayed for the past 3 hours due to stat exams.  She will be going next.  A COVID-19 test has been ordered, suspected due to patient's multiple rib fractures along with state of disheveled, unkept, comorbidities she is at high risk for developing any potential pneumonia.  Will admit patient to trauma service.  1:18 PM spoke to son at the bedside who stated patient currently states at home alone for several hours throughout the day, she is currently eloping, does not bathe or feed herself.  He would like some resources for some sort of help while at home.  Will now place a consult for social work.  1:43 PM CT of her chest/abdomen/pelvis showed:  1. Left lower rib fractures likely  accounting for the patient's left  flank and side pain. No significant displacement and no associated  pneumothorax or pleural hematoma.  2. Dependent bibasilar atelectasis but no worrisome pulmonary  findings.  3. Normal appearance of the heart and great vessels. No mediastinal  hematoma.  4. No acute intra-abdominal/intrapelvic abnormality. No free air or  free fluid and the solid abdominal organs are intact.  5. Large amount of stool throughout the colon suggesting  constipation.  6. Intact bony pelvis.   Spoke to trauma PA Janett Billow who will call back with recommendations.  2:28 PM Trauma service recommended patient be admitted through the hospitalist service, they are to consult on rib fractures.   3:21 PM Spoke to Dr. Tamala Julian who will admit patient for further management.  Also spoken to show work [place], patient has been granted PT, OT, home health however no nursing care.  Portions of this note were generated with [employer]. Dictation errors may occur despite best attempts at proofreading.  Final Clinical Impressions(s) / ED Diagnoses   Final diagnoses:  Fall, initial encounter  Multiple fractures of ribs, left side, initial encounter for closed fracture  Closed fracture of left inferior pubic ramus, initial encounter [facility])  Hyperglycemia    ED Discharge Orders    None       Janeece Fitting, PA-C 05/19/19 [place], [place], MD 05/20/19 [phone]

## 2019-05-19 NOTE — ED Notes (Signed)
Patient transported to CT 

## 2019-05-19 NOTE — ED Notes (Signed)
Dinner Tray Ordered @ 1812.  

## 2019-05-19 NOTE — ED Notes (Signed)
Pt had episode of emesis, green bile prior to covid swab.  Gown changed and provider in room for update.

## 2019-05-19 NOTE — ED Notes (Signed)
Pt now has visitor in room.

## 2019-05-19 NOTE — ED Notes (Addendum)
Son at bedside, has questions regarding admit and resources for patient after discharge.  PA will come visit with this son.  Brigitte Pulse  [phone]

## 2019-05-20 DIAGNOSIS — K5900 Constipation, unspecified: Secondary | ICD-10-CM | POA: Diagnosis present

## 2019-05-20 DIAGNOSIS — Z515 Encounter for palliative care: Secondary | ICD-10-CM | POA: Diagnosis not present

## 2019-05-20 DIAGNOSIS — M25552 Pain in left hip: Secondary | ICD-10-CM | POA: Diagnosis not present

## 2019-05-20 DIAGNOSIS — E785 Hyperlipidemia, unspecified: Secondary | ICD-10-CM | POA: Diagnosis present

## 2019-05-20 DIAGNOSIS — I160 Hypertensive urgency: Secondary | ICD-10-CM | POA: Diagnosis present

## 2019-05-20 DIAGNOSIS — I10 Essential (primary) hypertension: Secondary | ICD-10-CM | POA: Diagnosis present

## 2019-05-20 DIAGNOSIS — D649 Anemia, unspecified: Secondary | ICD-10-CM | POA: Diagnosis present

## 2019-05-20 DIAGNOSIS — Z794 Long term (current) use of insulin: Secondary | ICD-10-CM | POA: Diagnosis not present

## 2019-05-20 DIAGNOSIS — E119 Type 2 diabetes mellitus without complications: Secondary | ICD-10-CM | POA: Diagnosis not present

## 2019-05-20 DIAGNOSIS — M25559 Pain in unspecified hip: Secondary | ICD-10-CM | POA: Diagnosis not present

## 2019-05-20 DIAGNOSIS — U071 COVID-19: Secondary | ICD-10-CM | POA: Diagnosis present

## 2019-05-20 DIAGNOSIS — Z23 Encounter for immunization: Secondary | ICD-10-CM | POA: Diagnosis not present

## 2019-05-20 DIAGNOSIS — Z79899 Other long term (current) drug therapy: Secondary | ICD-10-CM | POA: Diagnosis not present

## 2019-05-20 DIAGNOSIS — E7800 Pure hypercholesterolemia, unspecified: Secondary | ICD-10-CM | POA: Diagnosis present

## 2019-05-20 DIAGNOSIS — E11649 Type 2 diabetes mellitus with hypoglycemia without coma: Secondary | ICD-10-CM | POA: Diagnosis not present

## 2019-05-20 DIAGNOSIS — S2242XA Multiple fractures of ribs, left side, initial encounter for closed fracture: Secondary | ICD-10-CM | POA: Diagnosis present

## 2019-05-20 DIAGNOSIS — S32592A Other specified fracture of left pubis, initial encounter for closed fracture: Secondary | ICD-10-CM

## 2019-05-20 DIAGNOSIS — Y92009 Unspecified place in unspecified non-institutional (private) residence as the place of occurrence of the external cause: Secondary | ICD-10-CM | POA: Diagnosis not present

## 2019-05-20 DIAGNOSIS — W1830XA Fall on same level, unspecified, initial encounter: Secondary | ICD-10-CM | POA: Diagnosis present

## 2019-05-20 DIAGNOSIS — S2249XS Multiple fractures of ribs, unspecified side, sequela: Secondary | ICD-10-CM | POA: Diagnosis not present

## 2019-05-20 DIAGNOSIS — E871 Hypo-osmolality and hyponatremia: Secondary | ICD-10-CM | POA: Diagnosis present

## 2019-05-20 DIAGNOSIS — Z8249 Family history of ischemic heart disease and other diseases of the circulatory system: Secondary | ICD-10-CM | POA: Diagnosis not present

## 2019-05-20 DIAGNOSIS — W19XXXA Unspecified fall, initial encounter: Secondary | ICD-10-CM | POA: Diagnosis not present

## 2019-05-20 DIAGNOSIS — E1165 Type 2 diabetes mellitus with hyperglycemia: Secondary | ICD-10-CM | POA: Diagnosis present

## 2019-05-20 LAB — CBC
HCT: 29 % — ABNORMAL LOW (ref 36.0–46.0)
Hemoglobin: 9.8 g/dL — ABNORMAL LOW (ref 12.0–15.0)
MCH: 30.1 pg (ref 26.0–34.0)
MCHC: 33.8 g/dL (ref 30.0–36.0)
MCV: 89 fL (ref 80.0–100.0)
Platelets: 348 10*3/uL (ref 150–400)
RBC: 3.26 MIL/uL — ABNORMAL LOW (ref 3.87–5.11)
RDW: 14 % (ref 11.5–15.5)
WBC: 5.3 10*3/uL (ref 4.0–10.5)
nRBC: 0.4 % — ABNORMAL HIGH (ref 0.0–0.2)

## 2019-05-20 LAB — ABO/RH: ABO/RH(D): O POS

## 2019-05-20 LAB — COMPREHENSIVE METABOLIC PANEL
ALT: 12 U/L (ref 0–44)
AST: 11 U/L — ABNORMAL LOW (ref 15–41)
Albumin: 2.4 g/dL — ABNORMAL LOW (ref 3.5–5.0)
Alkaline Phosphatase: 73 U/L (ref 38–126)
Anion gap: 9 (ref 5–15)
BUN: 21 mg/dL (ref 8–23)
CO2: 25 mmol/L (ref 22–32)
Calcium: 8.4 mg/dL — ABNORMAL LOW (ref 8.9–10.3)
Chloride: 103 mmol/L (ref 98–111)
Creatinine, Ser: 1.08 mg/dL — ABNORMAL HIGH (ref 0.44–1.00)
GFR calc Af Amer: 58 mL/min — ABNORMAL LOW (ref >60)
GFR calc non Af Amer: 50 mL/min — ABNORMAL LOW (ref >60)
Glucose, Bld: 138 mg/dL — ABNORMAL HIGH (ref 70–99)
Potassium: 3.6 mmol/L (ref 3.5–5.1)
Sodium: 137 mmol/L (ref 135–145)
Total Bilirubin: 0.8 mg/dL (ref 0.3–1.2)
Total Protein: 5.9 g/dL — ABNORMAL LOW (ref 6.5–8.1)

## 2019-05-20 LAB — HEMOGLOBIN A1C
Hgb A1c MFr Bld: 11.7 % — ABNORMAL HIGH (ref 4.8–5.6)
Mean Plasma Glucose: 289.09 mg/dL

## 2019-05-20 LAB — GLUCOSE, CAPILLARY
Glucose-Capillary: 91 mg/dL (ref 70–99)
Glucose-Capillary: 155 mg/dL — ABNORMAL HIGH (ref 70–99)
Glucose-Capillary: 107 mg/dL — ABNORMAL HIGH (ref 70–99)
Glucose-Capillary: 137 mg/dL — ABNORMAL HIGH (ref 70–99)

## 2019-05-20 LAB — FERRITIN: Ferritin: 58 ng/mL (ref 11–307)

## 2019-05-20 LAB — D-DIMER, QUANTITATIVE: D-Dimer, Quant: 1.46 ug/mL-FEU — ABNORMAL HIGH (ref 0.00–0.50)

## 2019-05-20 LAB — MAGNESIUM: Magnesium: 1.8 mg/dL (ref 1.7–2.4)

## 2019-05-20 LAB — C-REACTIVE PROTEIN: CRP: 0.9 mg/dL (ref <1.0)

## 2019-05-20 LAB — PHOSPHORUS: Phosphorus: 3.1 mg/dL (ref 2.5–4.6)

## 2019-05-20 MED ORDER — METHOCARBAMOL 500 MG PO TABS
1000.0000 mg | ORAL_TABLET | Freq: Four times a day (QID) | ORAL | Status: DC
  Administered 2019-05-20 – 2019-05-24 (×15): 1000 mg via ORAL
  Filled 2019-05-20 (×15): qty 2

## 2019-05-20 MED ORDER — DOCUSATE SODIUM 100 MG PO CAPS
100.0000 mg | ORAL_CAPSULE | Freq: Two times a day (BID) | ORAL | Status: DC
  Administered 2019-05-20 – 2019-05-21 (×2): 100 mg via ORAL
  Filled 2019-05-20 (×2): qty 1

## 2019-05-20 MED ORDER — TRAMADOL HCL 50 MG PO TABS
50.0000 mg | ORAL_TABLET | ORAL | Status: DC | PRN
  Administered 2019-05-21: 100 mg via ORAL
  Administered 2019-05-22 – 2019-05-23 (×3): 50 mg via ORAL
  Administered 2019-05-23: 100 mg via ORAL
  Administered 2019-05-23 – 2019-05-24 (×2): 50 mg via ORAL
  Filled 2019-05-20: qty 2
  Filled 2019-05-20 (×3): qty 1
  Filled 2019-05-20: qty 2
  Filled 2019-05-20 (×2): qty 1

## 2019-05-20 MED ORDER — POLYETHYLENE GLYCOL 3350 17 G PO PACK
17.0000 g | PACK | Freq: Every day | ORAL | Status: DC
  Administered 2019-05-20 – 2019-05-21 (×2): 17 g via ORAL
  Filled 2019-05-20 (×2): qty 1

## 2019-05-20 MED ORDER — ACETAMINOPHEN 325 MG PO TABS
650.0000 mg | ORAL_TABLET | Freq: Four times a day (QID) | ORAL | Status: DC
  Administered 2019-05-20 – 2019-05-24 (×15): 650 mg via ORAL
  Filled 2019-05-20 (×15): qty 2

## 2019-05-20 MED ORDER — MORPHINE SULFATE (PF) 2 MG/ML IV SOLN
1.0000 mg | INTRAVENOUS | Status: DC | PRN
  Administered 2019-05-20 – 2019-05-27 (×14): 1 mg via INTRAVENOUS
  Filled 2019-05-20 (×14): qty 1

## 2019-05-20 MED ORDER — METHOCARBAMOL 500 MG PO TABS: 500.0000 mg | ORAL_TABLET | Freq: Four times a day (QID) | ORAL | Status: DC

## 2019-05-20 MED ORDER — METHOCARBAMOL 500 MG PO TABS: 500.0000 mg | ORAL_TABLET | Freq: Four times a day (QID) | ORAL | Status: DC | PRN

## 2019-05-20 MED ORDER — SENNOSIDES-DOCUSATE SODIUM 8.6-50 MG PO TABS
2.0000 | ORAL_TABLET | Freq: Two times a day (BID) | ORAL | Status: DC
  Administered 2019-05-20 – 2019-05-21 (×3): 2 via ORAL
  Filled 2019-05-20 (×3): qty 2

## 2019-05-20 NOTE — Evaluation (Addendum)
Occupational Therapy Evaluation Patient Details Name: Emily Hoover MRN: [id number] DOB: June 04, 1946 Today's Date: 05/20/2019    History of Present Illness 73 y.o. female with medical history significant of hypertension, hyperlipidemia, and uncontrolled diabetes mellitus type 2.  She presented last night with complaints of left-sided pain after having a fall 05/18/19. Due to difficulty with translation cause of fall unclear, chart reveals hx vertigo/syncope. Presented to ED 05/19/19, chest xray revealed fx of L lateral ribs 5-7 and L posterior ribs 10-11. Also found to have BP 194/90 EKG reveals first degree heart block. COVID (+).    Clinical Impression   PTA, pt was living with her son and was independent; son works during the day. Pt currently requiring Min A for UB ADLs, Mod-Max A for LB ADLs, and Min A for functional mobility with RW. Pt presenting with decreased balance and activity tolerance due to significant pain at left ribs and hip. VSS throughout. Pt would benefit from further acute OT to facilitate safe dc. Recommend dc to SNFT for further OT to optimize safety, independence with ADLs, and return to PLOF.      Follow Up Recommendations  SNF;Supervision/Assistance - 24 hour    Equipment Recommendations  3 in 1 bedside commode ; RW   Recommendations for Other Services PT consult     Precautions / Restrictions Precautions Precautions: Fall      Mobility Bed Mobility Overal bed mobility: Needs Assistance Bed Mobility: Rolling;Sidelying to Sit Rolling: Min assist;+2 for safety/equipment Sidelying to sit: +2 for safety/equipment;+2 for physical assistance;Mod assist       General bed mobility comments: Educating pt on log roll technique. Pt initating log roll  and then began to reutrn to supine because of pain. Requiring Mod A +2 for elevating trunk into sitting due to pain  Transfers Overall transfer level: Needs assistance Equipment used: None Transfers: Sit to/from  Stand Sit to Stand: Min assist         General transfer comment: Min A for slight power up, safety, and balance in standing.    Balance Overall balance assessment: Needs assistance Sitting-balance support: No upper extremity supported;Feet supported Sitting balance-Leahy Scale: Fair     Standing balance support: Bilateral upper extremity supported;During functional activity Standing balance-Leahy Scale: Poor Standing balance comment: Reliant on UE support and physical A                           ADL either performed or assessed with clinical judgement   ADL Overall ADL's : Needs assistance/impaired Eating/Feeding: Set up;Sitting   Grooming: Set up;Sitting;Bed level   Upper Body Bathing: Minimal assistance;Sitting   Lower Body Bathing: Moderate assistance;Sit to/from stand   Upper Body Dressing : Minimal assistance;Sitting   Lower Body Dressing: Maximal assistance;Sit to/from stand   Toilet Transfer: Minimal assistance;+2 for safety/equipment;Ambulation;RW(Simulated to recliner)           Functional mobility during ADLs: Minimal assistance;Rolling walker;+2 for safety/equipment General ADL Comments: Pt presenting with decreased balance and activity tolerance due to significant pain     Vision         Perception     Praxis      Pertinent Vitals/Pain       Hand Dominance Right   Extremity/Trunk Assessment Upper Extremity Assessment Upper Extremity Assessment: Overall WFL for tasks assessed   Lower Extremity Assessment Lower Extremity Assessment: Defer to PT evaluation   Cervical / Trunk Assessment Cervical / Trunk Assessment: Other exceptions(L rib  fx)   Communication Communication Communication: Prefers language other than English([place] [phone])   Cognition Arousal/Alertness: Awake/alert Behavior During Therapy: WFL for tasks assessed/performed Overall Cognitive Status: No family/caregiver present to determine baseline cognitive  functioning                                     General Comments  SpO2 98% on RA. BP 135/72 supine. HR at rest 80 bpm, with ambulation 86 bpm. SpO2 after ambulation 90% on RA    Exercises     Shoulder Instructions      Home Living Family/patient expects to be discharged to:: Private residence Living Arrangements: Children(Son) Available Help at Discharge: Family;Available PRN/intermittently Type of Home: Mobile home Home Access: Stairs to enter Entrance Stairs-Number of Steps: 4 Entrance Stairs-Rails: Right;Left;Can reach both Home Layout: One level     Bathroom Shower/Tub: Teacher, early years/pre: Standard     Home Equipment: Bedside commode          Prior Functioning/Environment Level of Independence: Independent                 OT Problem List: Decreased strength;Decreased range of motion;Decreased activity tolerance;Impaired balance (sitting and/or standing);Decreased knowledge of use of DME or AE;Decreased knowledge of precautions;Pain      OT Treatment/Interventions: Self-care/ADL training;Therapeutic exercise;Energy conservation;DME and/or AE instruction;Therapeutic activities;Balance training;Patient/family education    OT Goals(Current goals can be found in the care plan section) Acute Rehab OT Goals Patient Stated Goal: Stop this pain OT Goal Formulation: With patient Time For Goal Achievement: 06/03/19 Potential to Achieve Goals: Good  OT Frequency: Min 3X/week   Barriers to D/C:            Co-evaluation PT/OT/SLP Co-Evaluation/Treatment: Yes Reason for Co-Treatment: For patient/therapist safety;To address functional/ADL transfers   OT goals addressed during session: ADL's and self-care      AM-PAC OT "6 Clicks" Daily Activity     Outcome Measure Help from another person eating meals?: None Help from another person taking care of personal grooming?: None Help from another person toileting, which includes using  toliet, bedpan, or urinal?: A Little Help from another person bathing (including washing, rinsing, drying)?: A Lot Help from another person to put on and taking off regular upper body clothing?: A Little Help from another person to put on and taking off regular lower body clothing?: A Lot 6 Click Score: 18   End of Session Equipment Utilized During Treatment: Rolling walker Nurse Communication: Mobility status  Activity Tolerance: Patient tolerated treatment well;Patient limited by pain Patient left: in chair;with call bell/phone within reach  OT Visit Diagnosis: Unsteadiness on feet (R26.81);Other abnormalities of gait and mobility (R26.89);Muscle weakness (generalized) (M62.81);Pain Pain - Right/Left: Left Pain - part of body: Hip(Rib)                Time: [id number] OT Time Calculation (min): 30 min Charges:  OT General Charges $OT Visit: 1 Visit OT Evaluation $OT Eval Moderate Complexity: [place]alier, OTR/L Acute Rehab Pager: [phone] Office: [place] 05/20/2019, 1:05 PM

## 2019-05-20 NOTE — Plan of Care (Signed)
  Problem: Education: Goal: Knowledge of General Education information will improve Description: Including pain rating scale, medication(s)/side effects and non-pharmacologic comfort measures Outcome: Not Progressing   Problem: Nutrition: Goal: Adequate nutrition will be maintained Outcome: Not Progressing   Problem: Pain Managment: Goal: General experience of comfort will improve Outcome: Not Progressing

## 2019-05-20 NOTE — Evaluation (Signed)
Physical Therapy Evaluation Patient Details Name: Emily Hoover MRN: [id number] DOB: 1946-07-09 Today's Date: 05/20/2019   History of Present Illness  73 y.o. female with medical history significant of hypertension, hyperlipidemia, and uncontrolled diabetes mellitus type 2.  She presented last night with complaints of left-sided pain after having a fall 05/18/19. Due to difficulty with translation cause of fall unclear, chart reveals hx vertigo/syncope. Presented to ED 05/19/19, chest xray revealed fx of L lateral ribs 5-7 and L posterior ribs 10-11. Also found to have BP 194/90 EKG reveals first degree heart block. COVID (+).   Clinical Impression  PTA pt living with son in single story home with steps to enter. Pt reports independence in mobility and ADLs, and iADLs. Pt is limited in safe mobility by pain in L ribs and hip with movement. Pt requires increased encouragement to move with therapy. Pt is mod A for bed mobility and min A for transfers and ambulation of 20 feet with RW. Pt was in tears by the end of ambulation back to recliner. Given the fact pt does not have 24 hour assist at home, PT recommending SNF level rehab to return to prior level of function. PT will continue to follow acutely.    Follow Up Recommendations SNF    Equipment Recommendations  Other (comment)(TBD at next venue)       Precautions / Restrictions Precautions Precautions: Fall Restrictions Weight Bearing Restrictions: No      Mobility  Bed Mobility Overal bed mobility: Needs Assistance Bed Mobility: Rolling;Sidelying to Sit Rolling: Min assist;+2 for safety/equipment Sidelying to sit: +2 for safety/equipment;+2 for physical assistance;Mod assist       General bed mobility comments: Educating pt on log roll technique. Pt initating log roll  and then began to reutrn to supine because of pain. Requiring Mod A +2 for elevating trunk into sitting due to pain  Transfers Overall transfer level: Needs  assistance Equipment used: None Transfers: Sit to/from Stand Sit to Stand: Min assist         General transfer comment: Min A for slight power up, safety, and balance in standing.  Ambulation/Gait Ambulation/Gait assistance: Min assist Gait Distance (Feet): 20 Feet Assistive device: Rolling walker (2 wheeled) Gait Pattern/deviations: Step-through pattern;Decreased step length - right;Decreased step length - left;Decreased stance time - left;Decreased weight shift to left;Shuffle;Antalgic Gait velocity: slowed Gait velocity interpretation: <1.8 ft/sec, indicate of risk for recurrent falls General Gait Details: min A for steadying with slow, shufflings, steps, vc for further decreasing L step length when turning to L to decrease pain         Balance Overall balance assessment: Needs assistance Sitting-balance support: No upper extremity supported;Feet supported Sitting balance-Leahy Scale: Fair     Standing balance support: Bilateral upper extremity supported;During functional activity Standing balance-Leahy Scale: Poor Standing balance comment: Reliant on UE support and physical A                             Pertinent Vitals/Pain Pain Assessment: Faces Pain Score: 10-Worst pain ever Pain Location: L ribs and hip Pain Descriptors / Indicators: Grimacing;Moaning;Crying;Guarding Pain Intervention(s): Limited activity within patient's tolerance;Monitored during session;Repositioned    Home Living Family/patient expects to be discharged to:: Private residence Living Arrangements: Children(Son) Available Help at Discharge: Family;Available PRN/intermittently Type of Home: Mobile home Home Access: Stairs to enter Entrance Stairs-Rails: Right;Left;Can reach both Entrance Stairs-Number of Steps: 4 Home Layout: One level Home Equipment: Bedside commode  Prior Function Level of Independence: Independent               Hand Dominance   Dominant Hand:  Right    Extremity/Trunk Assessment   Upper Extremity Assessment Upper Extremity Assessment: Defer to OT evaluation    Lower Extremity Assessment Lower Extremity Assessment: LLE deficits/detail LLE Deficits / Details: L hip pain with movement, ROM of L LE limited by pain     Cervical / Trunk Assessment Cervical / Trunk Assessment: Other exceptions(L rib fx) Cervical / Trunk Exceptions: L lateral ribs 5-7 and posterior L ribs 10-11  Communication   Communication: Prefers language other than English([place] [id number])  Cognition Arousal/Alertness: Awake/alert Behavior During Therapy: WFL for tasks assessed/performed Overall Cognitive Status: No family/caregiver present to determine baseline cognitive functioning                                        General Comments General comments (skin integrity, edema, etc.): SpO2 98% on RA. BP 135/72 supine. HR at rest 80 bpm, with ambulation 86 bpm. SpO2 after ambulation 90% on RA        Assessment/Plan    PT Assessment Patient needs continued PT services  PT Problem List Decreased range of motion;Decreased activity tolerance;Decreased mobility;Pain       PT Treatment Interventions DME instruction;Gait training;Functional mobility training;Therapeutic activities;Balance training;Therapeutic exercise;Cognitive remediation;Patient/family education    PT Goals (Current goals can be found in the Care Plan section)  Acute Rehab PT Goals Patient Stated Goal: Stop this pain PT Goal Formulation: With patient Time For Goal Achievement: 06/03/19 Potential to Achieve Goals: Fair    Frequency Min 2X/week   Barriers to discharge Decreased caregiver support son works and is not home all day    Co-evaluation PT/OT/SLP Co-Evaluation/Treatment: Yes Reason for Co-Treatment: For patient/therapist safety PT goals addressed during session: Mobility/safety with mobility         AM-PAC PT "6 Clicks" Mobility  Outcome  Measure Help needed turning from your back to your side while in a flat bed without using bedrails?: A Little Help needed moving from lying on your back to sitting on the side of a flat bed without using bedrails?: A Lot Help needed moving to and from a bed to a chair (including a wheelchair)?: A Little Help needed standing up from a chair using your arms (e.g., wheelchair or bedside chair)?: A Little Help needed to walk in hospital room?: A Little Help needed climbing 3-5 steps with a railing? : A Lot 6 Click Score: 16    End of Session   Activity Tolerance: Patient limited by pain Patient left: in chair;with call bell/phone within reach Nurse Communication: Mobility status PT Visit Diagnosis: Unsteadiness on feet (R26.81);Other abnormalities of gait and mobility (R26.89);Muscle weakness (generalized) (M62.81);History of falling (Z91.81);Difficulty in walking, not elsewhere classified (R26.2);Pain Pain - Right/Left: Left Pain - part of body: Hip(ribs)    Time: [id number] PT Time Calculation (min) (ACUTE ONLY): 30 min   Charges:   PT Evaluation $PT Eval Moderate Complexity: 1 Mod          Rozell Kettlewell B. Migdalia Dk PT, DPT Acute Rehabilitation Services Pager [phone] Office [phone]   [place] 05/20/2019, 4:42 PM

## 2019-05-20 NOTE — Progress Notes (Signed)
Inpatient Diabetes Program Recommendations  AACE/ADA: New Consensus Statement on Inpatient Glycemic Control (2015)  Target Ranges:  Prepandial:   less than 140 mg/dL      Peak postprandial:   less than 180 mg/dL (1-2 hours)      Critically ill patients:  140 - 180 mg/dL   Lab Results  Component Value Date   GLUCAP 155 (H) 05/20/2019   HGBA1C 11.7 (H) 05/20/2019    Review of Glycemic Control Results for ANTARA, GHUMAN (MRN [id number]) as of 05/20/2019 13:31  Ref. Range 05/19/2019 08:40 05/19/2019 19:31 05/20/2019 08:11 05/20/2019 12:42  Glucose-Capillary Latest Ref Range: 70 - 99 mg/dL 382 (H) 258 (H) 91 155 (H)   Diabetes history: DM 2 Outpatient Diabetes medications: Metformin 1000 mg bid, Lantus 20 units daily Current orders for Inpatient glycemic control:  Novolog moderate tid with meals Lantus 20 units q HS  Inpatient Diabetes Program Recommendations:    Blood sugars currently controlled per hospital goals.  A1C indicates poor control of DM prior to admit however it is improved since last check in December, 2019 (14.6%).  Will need to discuss with patient/family prior to d/c to discuss home glycemic control and improved DM management.   Thanks Adah Perl, RN, BC-ADM Inpatient Diabetes Coordinator Pager [phone] (8a-5p)

## 2019-05-20 NOTE — Progress Notes (Signed)
[facility] Progress Note     Subjective: CC-  Comfortable this morning. States that she still has a lot of pain from rib fractures when she moves. She was able to get OOB to beside commode. Pain medication helped a little but not much. Denies SOB. O2 sats stable on room air. She does not have an incentive spirometer.  Objective: Vital signs in last 24 hours: Temp:  [97.4 F (36.3 C)] 97.4 F (36.3 C) (11/11 0117) Pulse Rate:  [76-82] 79 (11/11 0819) Resp:  [10-20] 15 (11/11 0819) BP: (107-194)/(56-97) 132/69 (11/11 0819) SpO2:  [96 %-100 %] 96 % (11/11 0819) Weight:  [43 kg] 43 kg (11/11 0117) Last BM Date: 05/19/19  Intake/Output from previous day: 11/10 0701 - 11/11 0700 In: 500 [IV Piggyback:500] Out: -  Intake/Output this shift: Total I/O In: 20 [P.O.:20] Out: -   PE: Gen:  Alert, NAD, pleasant HEENT: EOM's intact, pupils equal and round Card:  RRR Pulm:  CTAB, no W/R/R, rate and effort normal on room air. Pulling 750 on IS Abd: Soft, NT/ND, +BS, no HSM Skin: no rashes noted, warm and dry  Lab Results:  Recent Labs    05/19/19 0816 05/20/19 0413  WBC 7.1 5.3  HGB 12.5 9.8*  HCT 37.2 29.0*  PLT 389 348   BMET Recent Labs    05/19/19 0816 05/20/19 0413  NA 133* 137  K 3.7 3.6  CL 95* 103  CO2 25 25  GLUCOSE 413* 138*  BUN 21 21  CREATININE 0.78 1.08*  CALCIUM 9.9 8.4*   PT/INR No results for input(s): LABPROT, INR in the last 72 hours. CMP     Component Value Date/Time   NA 137 05/20/2019 0413   NA 139 08/02/2017 0828   K 3.6 05/20/2019 0413   CL 103 05/20/2019 0413   CO2 25 05/20/2019 0413   GLUCOSE 138 (H) 05/20/2019 0413   BUN 21 05/20/2019 0413   BUN 14 08/02/2017 0828   CREATININE 1.08 (H) 05/20/2019 0413   CREATININE 0.79 10/31/2015 1655   CALCIUM 8.4 (L) 05/20/2019 0413   PROT 5.9 (L) 05/20/2019 0413   PROT 6.9 08/02/2017 0828   ALBUMIN 2.4 (L) 05/20/2019 0413   ALBUMIN 4.0 08/02/2017 0828   AST 11 (L) 05/20/2019  0413   ALT 12 05/20/2019 0413   ALKPHOS 73 05/20/2019 0413   BILITOT 0.8 05/20/2019 0413   BILITOT 0.7 08/02/2017 0828   GFRNONAA 50 (L) 05/20/2019 0413   GFRNONAA 76 10/31/2015 1655   GFRAA 58 (L) 05/20/2019 0413   GFRAA 87 10/31/2015 1655   Lipase     Component Value Date/Time   LIPASE 22 11/18/2017 1056       Studies/Results: Dg Ribs Unilateral W/chest Left  Result Date: 05/18/2019 CLINICAL DATA:  [place] yesterday.  Left-sided pain. EXAM: LEFT RIBS AND CHEST - 3+ VIEW COMPARISON:  10/06/2018 FINDINGS: Multiple lower left rib fractures. Fracture of the lateral ribs number 5, 6 and 7. Fracture of the posterior ribs numbers 10 and 11. No evidence of pneumothorax or hemothorax. IMPRESSION: Multiple left rib fractures. Lateral fractures numbers 5 through 7. Posterior fractures numbers 10 and 11. Electronically Signed   By: Nelson Chimes M.D.   On: 05/18/2019 21:48   Dg Pelvis 1-2 Views  Result Date: 05/18/2019 CLINICAL DATA:  [place] yesterday. Pain. EXAM: PELVIS - 1-2 VIEW COMPARISON:  None. FINDINGS: Question inferior ramus fracture on the left. No other pelvic ring disruption identified or suspected. IMPRESSION: Suspicion of inferior  ramus fracture on the left. Electronically Signed   By: Nelson Chimes M.D.   On: 05/18/2019 21:44   Ct Head Wo Contrast  Result Date: 05/19/2019 CLINICAL DATA:  Pain after fall EXAM: CT HEAD WITHOUT CONTRAST TECHNIQUE: Contiguous axial images were obtained from the base of the skull through the vertex without intravenous contrast. COMPARISON:  01/19/2011 FINDINGS: Brain: No evidence of acute infarction, hemorrhage, hydrocephalus, extra-axial collection or mass lesion/mass effect. Vascular: Mild atherosclerotic calcifications involving the large vessels of the skull base. No unexpected hyperdense vessel. Skull: Normal. Negative for fracture or focal lesion. Sinuses/Orbits: Unchanged left ethmoid osteoma. Paranasal sinuses and mastoid air cells are clear. Orbital  structures unremarkable. Other: None. IMPRESSION: No acute intracranial abnormality. Electronically Signed   By: Davina Poke M.D.   On: 05/19/2019 09:04   Ct Chest W Contrast  Result Date: 05/19/2019 CLINICAL DATA:  [place] yesterday. Left-sided abdominal and flank pain. EXAM: CT CHEST, ABDOMEN, AND PELVIS WITH CONTRAST TECHNIQUE: Multidetector CT imaging of the chest, abdomen and pelvis was performed following the standard protocol during bolus administration of intravenous contrast. CONTRAST:  162mL OMNIPAQUE IOHEXOL 300 MG/ML  SOLN COMPARISON:  CT scan 09/16/2015 FINDINGS: CT CHEST FINDINGS Cardiovascular: The heart is normal in size. No pericardial effusion. The aorta is normal in caliber. There is moderate tortuosity and atherosclerotic calcification. No dissection. The branch vessels are patent. Scattered atherosclerotic calcifications. Scattered coronary artery calcifications are also noted. The pulmonary arteries appear normal. Mediastinum/Nodes: No mediastinal or hilar mass or adenopathy or hematoma. Lungs/Pleura: No acute pulmonary findings. No pulmonary contusion, pleural effusion or pneumothorax. Dependent bibasilar atelectasis is noted. There are underlying emphysematous changes and pulmonary scarring. No worrisome pulmonary lesions. Musculoskeletal: No breast masses, supraclavicular or axillary adenopathy. The bony thorax is intact. No definite sternal fractures. The thoracic vertebral bodies are normally aligned. No acute fracture. There are left lower rib fractures. There is a nondisplaced eighth lateral rib fracture and nondisplaced eleventh and twelfth posterior rib fractures. CT ABDOMEN PELVIS FINDINGS Hepatobiliary: No focal hepatic lesions or acute hepatic injury. No perihepatic fluid collections. The portal and hepatic veins are patent. The gallbladder is grossly normal. No common bile duct dilatation. Pancreas: No mass, inflammation or ductal dilatation. Moderate atrophy of the  pancreatic body and tail regions. No acute pancreatic injury or peripancreatic fluid collection. Spleen: Normal size.  No focal lesions. Adrenals/Urinary Tract: The adrenal glands and kidneys are unremarkable. Small scattered renal cysts are noted. Mild renal cortical scarring changes. No collecting system abnormalities. The bladder appears normal. Stomach/Bowel: The stomach, duodenum and small bowel are unremarkable. No acute inflammatory changes, mass lesions or obstructive findings. There is a large amount of stool throughout the colon and down into the rectum suggesting constipation. No inflammatory changes or mass lesions. Vascular/Lymphatic: Moderate tortuosity and calcification of the abdominal aorta and iliac arteries but no aneurysm or dissection. The branch vessels are patent. The major venous structures are patent. No mesenteric or retroperitoneal mass or adenopathy. No hematoma. Reproductive: The uterus and ovaries are unremarkable. Other: No free pelvic fluid collections or pelvic hematoma. Musculoskeletal: The bony structures are intact. No acute fractures are identified. The hips are normally located. The pubic symphysis and SI joints are intact. 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 Cardiovascular: The heart is normal in size. IMPRESSION: 1. Left lower rib fractures likely accounting for the patient's left flank and side  pain. No significant displacement and no associated pneumothorax or pleural hematoma. 2. Dependent bibasilar atelectasis but no worrisome pulmonary findings. 3. Normal appearance of the heart and great vessels. No mediastinal hematoma. 4. No acute intra-abdominal/intrapelvic abnormality. No free air or free fluid and the solid abdominal organs are intact. 5. Large amount of stool throughout the colon suggesting constipation. 6. Intact bony pelvis.  Electronically Signed   By: Marijo Sanes M.D.   On: 05/19/2019 13:25   Ct Abdomen Pelvis W Contrast  Result Date: 05/19/2019 CLINICAL DATA:  [place] yesterday. Left-sided abdominal and flank pain. EXAM: CT CHEST, ABDOMEN, AND PELVIS WITH CONTRAST TECHNIQUE: Multidetector CT imaging of the chest, abdomen and pelvis was performed following the standard protocol during bolus administration of intravenous contrast. CONTRAST:  125mL OMNIPAQUE IOHEXOL 300 MG/ML  SOLN COMPARISON:  CT scan 09/16/2015 FINDINGS: CT CHEST FINDINGS Cardiovascular: The heart is normal in size. No pericardial effusion. The aorta is normal in caliber. There is moderate tortuosity and atherosclerotic calcification. No dissection. The branch vessels are patent. Scattered atherosclerotic calcifications. Scattered coronary artery calcifications are also noted. The pulmonary arteries appear normal. Mediastinum/Nodes: No mediastinal or hilar mass or adenopathy or hematoma. Lungs/Pleura: No acute pulmonary findings. No pulmonary contusion, pleural effusion or pneumothorax. Dependent bibasilar atelectasis is noted. There are underlying emphysematous changes and pulmonary scarring. No worrisome pulmonary lesions. Musculoskeletal: No breast masses, supraclavicular or axillary adenopathy. The bony thorax is intact. No definite sternal fractures. The thoracic vertebral bodies are normally aligned. No acute fracture. There are left lower rib fractures. There is a nondisplaced eighth lateral rib fracture and nondisplaced eleventh and twelfth posterior rib fractures. CT ABDOMEN PELVIS FINDINGS Hepatobiliary: No focal hepatic lesions or acute hepatic injury. No perihepatic fluid collections. The portal and hepatic veins are patent. The gallbladder is grossly normal. No common bile duct dilatation. Pancreas: No mass, inflammation or ductal dilatation. Moderate atrophy of the pancreatic body and tail regions. No acute pancreatic injury or peripancreatic fluid  collection. Spleen: Normal size.  No focal lesions. Adrenals/Urinary Tract: The adrenal glands and kidneys are unremarkable. Small scattered renal cysts are noted. Mild renal cortical scarring changes. No collecting system abnormalities. The bladder appears normal. Stomach/Bowel: The stomach, duodenum and small bowel are unremarkable. No acute inflammatory changes, mass lesions or obstructive findings. There is a large amount of stool throughout the colon and down into the rectum suggesting constipation. No inflammatory changes or mass lesions. Vascular/Lymphatic: Moderate tortuosity and calcification of the abdominal aorta and iliac arteries but no aneurysm or dissection. The branch vessels are patent. The major venous structures are patent. No mesenteric or retroperitoneal mass or adenopathy. No hematoma. Reproductive: The uterus and ovaries are unremarkable. Other: No free pelvic fluid collections or pelvic hematoma. Musculoskeletal: The bony structures are intact. No acute fractures are identified. The hips are normally located. The pubic symphysis and SI joints are intact. 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 Cardiovascular: The heart is normal in size. IMPRESSION: 1. Left lower rib fractures likely accounting for the patient's left flank and side pain. No significant displacement and no associated pneumothorax or pleural hematoma. 2. Dependent bibasilar atelectasis but no worrisome pulmonary findings. 3. Normal appearance of the heart and great vessels. No mediastinal hematoma. 4. No acute intra-abdominal/intrapelvic abnormality. No free air or free fluid and the solid abdominal organs are intact. 5. Large amount of stool throughout the colon  suggesting constipation. 6. Intact bony pelvis. Electronically Signed   By: Marijo Sanes M.D.   On: 05/19/2019 13:25   Dg Knee  Complete 4 Views Left  Result Date: 05/18/2019 CLINICAL DATA:  [place] yesterday. Knee pain. EXAM: LEFT KNEE - COMPLETE 4+ VIEW COMPARISON:  None. FINDINGS: No evidence of fracture or joint effusion. 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 IMPRESSION: No acute or traumatic finding. 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 Electronically Signed   By: Nelson Chimes M.D.   On: 05/18/2019 21:40   Dg Femur Min 2 Views Left  Result Date: 05/18/2019 CLINICAL DATA:  [place] yesterday.  Pain. EXAM: LEFT FEMUR 2 VIEWS COMPARISON:  None. FINDINGS: There is no evidence of fracture or other focal bone lesions. Soft tissues are unremarkable. IMPRESSION: Negative. Electronically Signed   By: Nelson Chimes M.D.   On: 05/18/2019 21:41    Anti-infectives: Anti-infectives (From admission, onward)   None       Assessment/Plan Uncontrolled DM  HTN HLD Ground level fall Left rib FXs 8, 11, 12 - IS, pulm toilet, pain control COVID positive  FEN - CM diet VTE - SCDs, lovenox ID - none needed  Plan: Schedule tylenol and robaxin for better pain control. Continue tramadol PRN. PT/OT consults pending. Trauma will sign off. Please call with concerns. Recommend follow up with PCP after discharge.   LOS: 0 days    Wellington Hampshire, [facility] 05/20/2019, 10:18 AM Please see Amion for pager number during day hours 7:00am-4:30pm

## 2019-05-20 NOTE — Progress Notes (Signed)
[facility]  PROGRESS NOTE  Emily Hoover [id number] DOB: 06-16-1946 DOA: 05/18/2019 PCP: Charlott Rakes, MD Admit date - 05/18/2019   Admitting Physician Norval Morton, MD  Outpatient Primary MD for the patient is Charlott Rakes, MD  LOS - 0 Brief Narrative   Emily Hoover is a 73 y.o. year old female with medical history significant for HTN, DM, myositis who presented on 05/18/2019 with left sided pain after fall 2 days prior to admission where she landed on her rib regio and was found to have multiple left sided rib fractures.   Hospital course complicated by persistent pain from rib cage fractures requiring IV pain control, COVID infection without symptoms, hypertensive urgency  Subjective  FNAME@ Emily Hoover today has persistent left sided rib cage pain, breathing ok, no chest pain or cough, eating ok.  A & P    1. Multiple left-sided low rib fractures, limiting mobility due to persistent pain not responding to oral pain control including (as needed tramadol 5200 mg every 6 hours p.o. as needed severe pain, scheduled Robaxin 1 mg 4 times daily, scheduled Tylenol 650 mg every 6 hours) still having 7 out of 10 pain at rest with 10 out of 10 pain with minimal exertion.  IV morphine added for better pain control, given assistance needed for ambulation and transfers per PT evaluation and family  unable to provide 24-hour Assistance PT recommends SNF level rehab to return patient to prior level of function.No surgical intervention per trauma surgery evaluation, continue incentive spirometry 2. Concern for probable left pubic ramus fracture on XR. Pain control as above. 3. Unwitnessed fall.  History limited due to language barrier despite using interpreter.  Has history of vertigo/syncope. Check orthostatics in am 4. COVID-19 infection, asymptomatic.  Normal oxygen saturation on room air, continue vitamin C and zinc, closely monitor. 5. Type 2 diabetes, poorly controlled with  hyperglycemia, A1c 11.7, on admission blood glucose of greater than 400 with elevated anion gap and pseudohyponatremia 133.  Holding home metformin, continue Lantus 20 units, CBGs at goal 6. Hypertension, stable at goal.  Continue home lisinopril and amlodipine, on admission blood pressure quite elevated 194/90 I related to pain secondary to rib fractures, 7. Hyperlipidemia, stable.  Continue home atorvastatin     Family Communication  :  No family at bedside  Code Status :  FULL Code  Disposition Plan  : Continue IV pain control, PT recommends SNF  Consults  : Trauma surgery  Procedures  : None  DVT Prophylaxis  :  Lovenox   Lab Results  Component Value Date   PLT 348 05/20/2019    Diet :  Diet Order            Diet Carb Modified Fluid consistency: Thin; Room service appropriate? Yes  Diet effective now               Inpatient Medications Scheduled Meds:  acetaminophen  650 mg Oral Q6H   amLODipine  10 mg Oral Daily   atorvastatin  20 mg Oral Daily   docusate sodium  100 mg Oral BID   enoxaparin (LOVENOX) injection  40 mg Subcutaneous Daily   insulin aspart  0-15 Units Subcutaneous TID WC   insulin glargine  20 Units Subcutaneous QHS   lisinopril  5 mg Oral Daily   methocarbamol  1,000 mg Oral QID   polyethylene glycol  17 g Oral Daily   senna-docusate  2 tablet Oral BID   sodium chloride flush  3 mL  Intravenous Q12H   vitamin C  500 mg Oral Daily   zinc sulfate  220 mg Oral Daily   Continuous Infusions: PRN Meds:.albuterol, guaiFENesin-dextromethorphan, labetalol, morphine injection, ondansetron **OR** ondansetron (ZOFRAN) IV, traMADol  Antibiotics  :   Anti-infectives (From admission, onward)   None       Objective   Vitals:   05/20/19 0015 05/20/19 0117 05/20/19 0819 05/20/19 1706  BP: 109/62 128/67 132/69 138/71  Pulse:  76 79 75  Resp: 13 14 15 15   Temp:  (!) 97.4 F (36.3 C)    TempSrc:  Oral    SpO2:  98% 96% 94%  Weight:   43 kg      SpO2: 94 % O2 Flow Rate (L/min): 98 L/min  Wt Readings from Last 3 Encounters:  05/20/19 43 kg  10/06/18 45.4 kg  07/07/18 45.6 kg     Intake/Output Summary (Last 24 hours) at 05/20/2019 2211 Last data filed at 05/20/2019 0925 Gross per 24 hour  Intake 220 ml  Output --  Net 220 ml    Physical Exam:  Sitting in bedside chair, visibly in pain with minimal movement, Pain reproducible with palpation of left ribs, no bruising noted No new F.N deficits,  [place].AT, No JVD Symmetrical Chest wall movement, Good air movement bilaterally on room air, CTAB RRR,No Gallops,Rubs or new Murmurs,  +ve B.Sounds, Abd Soft, No tenderness, No organomegaly appreciated, No rebound, guarding or rigidity. No Cyanosis, Clubbing or edema, No new Rash or bruise     I have personally reviewed the following:   Data Reviewed:  CBC Recent Labs  Lab 05/19/19 0816 05/20/19 0413  WBC 7.1 5.3  HGB 12.5 9.8*  HCT 37.2 29.0*  PLT 389 348  MCV 88.8 89.0  MCH 29.8 30.1  MCHC 33.6 33.8  RDW 13.8 14.0  LYMPHSABS 1.0  --   MONOABS 0.4  --   EOSABS 0.0  --   BASOSABS 0.1  --     Chemistries  Recent Labs  Lab 05/19/19 0816 05/20/19 0413  NA 133* 137  K 3.7 3.6  CL 95* 103  CO2 25 25  GLUCOSE 413* 138*  BUN 21 21  CREATININE 0.78 1.08*  CALCIUM 9.9 8.4*  MG  --  1.8  AST 16 11*  ALT 15 12  ALKPHOS 107 73  BILITOT 1.2 0.8   ------------------------------------------------------------------------------------------------------------------ No results for input(s): CHOL, HDL, LDLCALC, TRIG, CHOLHDL, LDLDIRECT in the last 72 hours.  Lab Results  Component Value Date   HGBA1C 11.7 (H) 05/20/2019   ------------------------------------------------------------------------------------------------------------------ No results for input(s): TSH, T4TOTAL, T3FREE, THYROIDAB in the last 72 hours.  Invalid input(s):  FREET3 ------------------------------------------------------------------------------------------------------------------ Recent Labs    05/20/19 0413  FERRITIN 58    Coagulation profile No results for input(s): INR, PROTIME in the last 168 hours.  Recent Labs    05/20/19 0413  DDIMER 1.46*    Cardiac Enzymes No results for input(s): CKMB, TROPONINI, MYOGLOBIN in the last 168 hours.  Invalid input(s): CK ------------------------------------------------------------------------------------------------------------------ No results found for: BNP  Micro Results Recent Results (from the past 240 hour(s))  SARS CORONAVIRUS 2 (TAT 6-24 HRS) Nasopharyngeal Nasopharyngeal Swab     Status: Abnormal   Collection Time: 05/19/19 11:50 AM   Specimen: Nasopharyngeal Swab  Result Value Ref Range Status   SARS Coronavirus 2 POSITIVE (A) NEGATIVE Final    Comment: RESULT CALLED TO, READ BACK BY AND VERIFIED WITH: C BAIN,RN 1819 05/19/2019 D BRADLEY (NOTE) SARS-CoV-2 target nucleic acids are DETECTED.  The SARS-CoV-2 RNA is generally detectable in upper and lower respiratory specimens during the acute phase of infection. Positive results are indicative of active infection with SARS-CoV-2. Clinical  correlation with patient history and other diagnostic information is necessary to determine patient infection status. Positive results do  not rule out bacterial infection or co-infection with other viruses. The expected result is Negative. Fact Sheet for Patients: SugarRoll.be Fact Sheet for Healthcare Providers: https://www.woods-mathews.com/ This test is not yet approved or cleared by the [place] FDA and  has been authorized for detection and/or diagnosis of SARS-CoV-2 by FDA under an Emergency Use Authorization (EUA). This EUA will remain  in effect (meaning this test can be used) for t he duration of the COVID-19 declaration under Section  564(b)(1) of the Act, 21 U.S.C. section 360bbb-3(b)(1), unless the authorization is terminated or revoked sooner. Performed at [facility], [place] [address], [place], [place] [address]     Radiology Reports Dg Ribs Unilateral W/chest Left  Result Date: 05/18/2019 CLINICAL DATA:  [place] yesterday.  Left-sided pain. EXAM: LEFT RIBS AND CHEST - 3+ VIEW COMPARISON:  10/06/2018 FINDINGS: Multiple lower left rib fractures. Fracture of the lateral ribs number 5, 6 and 7. Fracture of the posterior ribs numbers 10 and 11. No evidence of pneumothorax or hemothorax. IMPRESSION: Multiple left rib fractures. Lateral fractures numbers 5 through 7. Posterior fractures numbers 10 and 11. Electronically Signed   By: Nelson Chimes M.D.   On: 05/18/2019 21:48   Dg Pelvis 1-2 Views  Result Date: 05/18/2019 CLINICAL DATA:  [place] yesterday. Pain. EXAM: PELVIS - 1-2 VIEW COMPARISON:  None. FINDINGS: Question inferior ramus fracture on the left. No other pelvic ring disruption identified or suspected. IMPRESSION: Suspicion of inferior ramus fracture on the left. Electronically Signed   By: Nelson Chimes M.D.   On: 05/18/2019 21:44   Ct Head Wo Contrast  Result Date: 05/19/2019 CLINICAL DATA:  Pain after fall EXAM: CT HEAD WITHOUT CONTRAST TECHNIQUE: Contiguous axial images were obtained from the base of the skull through the vertex without intravenous contrast. COMPARISON:  01/19/2011 FINDINGS: Brain: No evidence of acute infarction, hemorrhage, hydrocephalus, extra-axial collection or mass lesion/mass effect. Vascular: Mild atherosclerotic calcifications involving the large vessels of the skull base. No unexpected hyperdense vessel. Skull: Normal. Negative for fracture or focal lesion. Sinuses/Orbits: Unchanged left ethmoid osteoma. Paranasal sinuses and mastoid air cells are clear. Orbital structures unremarkable. Other: None. IMPRESSION: No acute intracranial abnormality. Electronically Signed   By: Davina Poke  M.D.   On: 05/19/2019 09:04   Ct Chest W Contrast  Result Date: 05/19/2019 CLINICAL DATA:  [place] yesterday. Left-sided abdominal and flank pain. EXAM: CT CHEST, ABDOMEN, AND PELVIS WITH CONTRAST TECHNIQUE: Multidetector CT imaging of the chest, abdomen and pelvis was performed following the standard protocol during bolus administration of intravenous contrast. CONTRAST:  123mL OMNIPAQUE IOHEXOL 300 MG/ML  SOLN COMPARISON:  CT scan 09/16/2015 FINDINGS: CT CHEST FINDINGS Cardiovascular: The heart is normal in size. No pericardial effusion. The aorta is normal in caliber. There is moderate tortuosity and atherosclerotic calcification. No dissection. The branch vessels are patent. Scattered atherosclerotic calcifications. Scattered coronary artery calcifications are also noted. The pulmonary arteries appear normal. Mediastinum/Nodes: No mediastinal or hilar mass or adenopathy or hematoma. Lungs/Pleura: No acute pulmonary findings. No pulmonary contusion, pleural effusion or pneumothorax. Dependent bibasilar atelectasis is noted. There are underlying emphysematous changes and pulmonary scarring. No worrisome pulmonary lesions. Musculoskeletal: No breast masses, supraclavicular or axillary adenopathy. The bony thorax  is intact. No definite sternal fractures. The thoracic vertebral bodies are normally aligned. No acute fracture. There are left lower rib fractures. There is a nondisplaced eighth lateral rib fracture and nondisplaced eleventh and twelfth posterior rib fractures. CT ABDOMEN PELVIS FINDINGS Hepatobiliary: No focal hepatic lesions or acute hepatic injury. No perihepatic fluid collections. The portal and hepatic veins are patent. The gallbladder is grossly normal. No common bile duct dilatation. Pancreas: No mass, inflammation or ductal dilatation. Moderate atrophy of the pancreatic body and tail regions. No acute pancreatic injury or peripancreatic fluid collection. Spleen: Normal size.  No focal lesions.  Adrenals/Urinary Tract: The adrenal glands and kidneys are unremarkable. Small scattered renal cysts are noted. Mild renal cortical scarring changes. No collecting system abnormalities. The bladder appears normal. Stomach/Bowel: The stomach, duodenum and small bowel are unremarkable. No acute inflammatory changes, mass lesions or obstructive findings. There is a large amount of stool throughout the colon and down into the rectum suggesting constipation. No inflammatory changes or mass lesions. Vascular/Lymphatic: Moderate tortuosity and calcification of the abdominal aorta and iliac arteries but no aneurysm or dissection. The branch vessels are patent. The major venous structures are patent. No mesenteric or retroperitoneal mass or adenopathy. No hematoma. Reproductive: The uterus and ovaries are unremarkable. Other: No free pelvic fluid collections or pelvic hematoma. Musculoskeletal: The bony structures are intact. No acute fractures are identified. The hips are normally located. The pubic symphysis and SI joints are intact. 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 Cardiovascular: The heart is normal in size. IMPRESSION: 1. Left lower rib fractures likely accounting for the patient's left flank and side pain. No significant displacement and no associated pneumothorax or pleural hematoma. 2. Dependent bibasilar atelectasis but no worrisome pulmonary findings. 3. Normal appearance of the heart and great vessels. No mediastinal hematoma. 4. No acute intra-abdominal/intrapelvic abnormality. No free air or free fluid and the solid abdominal organs are intact. 5. Large amount of stool throughout the colon suggesting constipation. 6. Intact bony pelvis. Electronically Signed   By: Marijo Sanes M.D.   On: 05/19/2019 13:25   Ct Abdomen Pelvis W Contrast  Result Date:  05/19/2019 CLINICAL DATA:  [place] yesterday. Left-sided abdominal and flank pain. EXAM: CT CHEST, ABDOMEN, AND PELVIS WITH CONTRAST TECHNIQUE: Multidetector CT imaging of the chest, abdomen and pelvis was performed following the standard protocol during bolus administration of intravenous contrast. CONTRAST:  141mL OMNIPAQUE IOHEXOL 300 MG/ML  SOLN COMPARISON:  CT scan 09/16/2015 FINDINGS: CT CHEST FINDINGS Cardiovascular: The heart is normal in size. No pericardial effusion. The aorta is normal in caliber. There is moderate tortuosity and atherosclerotic calcification. No dissection. The branch vessels are patent. Scattered atherosclerotic calcifications. Scattered coronary artery calcifications are also noted. The pulmonary arteries appear normal. Mediastinum/Nodes: No mediastinal or hilar mass or adenopathy or hematoma. Lungs/Pleura: No acute pulmonary findings. No pulmonary contusion, pleural effusion or pneumothorax. Dependent bibasilar atelectasis is noted. There are underlying emphysematous changes and pulmonary scarring. No worrisome pulmonary lesions. Musculoskeletal: No breast masses, supraclavicular or axillary adenopathy. The bony thorax is intact. No definite sternal fractures. The thoracic vertebral bodies are normally aligned. No acute fracture. There are left lower rib fractures. There is a nondisplaced eighth lateral rib fracture and nondisplaced eleventh and twelfth posterior rib fractures. CT ABDOMEN PELVIS FINDINGS Hepatobiliary: No focal hepatic lesions or acute hepatic injury. No perihepatic fluid collections. The portal and  hepatic veins are patent. The gallbladder is grossly normal. No common bile duct dilatation. Pancreas: No mass, inflammation or ductal dilatation. Moderate atrophy of the pancreatic body and tail regions. No acute pancreatic injury or peripancreatic fluid collection. Spleen: Normal size.  No focal lesions. Adrenals/Urinary Tract: The adrenal glands and kidneys are  unremarkable. Small scattered renal cysts are noted. Mild renal cortical scarring changes. No collecting system abnormalities. The bladder appears normal. Stomach/Bowel: The stomach, duodenum and small bowel are unremarkable. No acute inflammatory changes, mass lesions or obstructive findings. There is a large amount of stool throughout the colon and down into the rectum suggesting constipation. No inflammatory changes or mass lesions. Vascular/Lymphatic: Moderate tortuosity and calcification of the abdominal aorta and iliac arteries but no aneurysm or dissection. The branch vessels are patent. The major venous structures are patent. No mesenteric or retroperitoneal mass or adenopathy. No hematoma. Reproductive: The uterus and ovaries are unremarkable. Other: No free pelvic fluid collections or pelvic hematoma. Musculoskeletal: The bony structures are intact. No acute fractures are identified. The hips are normally located. The pubic symphysis and SI joints are intact. 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 Cardiovascular: The heart is normal in size. IMPRESSION: 1. Left lower rib fractures likely accounting for the patient's left flank and side pain. No significant displacement and no associated pneumothorax or pleural hematoma. 2. Dependent bibasilar atelectasis but no worrisome pulmonary findings. 3. Normal appearance of the heart and great vessels. No mediastinal hematoma. 4. No acute intra-abdominal/intrapelvic abnormality. No free air or free fluid and the solid abdominal organs are intact. 5. Large amount of stool throughout the colon suggesting constipation. 6. Intact bony pelvis. Electronically Signed   By: Marijo Sanes M.D.   On: 05/19/2019 13:25   Dg Knee Complete 4 Views Left  Result Date: 05/18/2019 CLINICAL DATA:  [place] yesterday. Knee pain. EXAM: LEFT KNEE -  COMPLETE 4+ VIEW COMPARISON:  None. FINDINGS: No evidence of fracture or joint effusion. 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 IMPRESSION: No acute or traumatic finding. 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 Electronically Signed   By: Nelson Chimes M.D.   On: 05/18/2019 21:40   Dg Femur Min 2 Views Left  Result Date: 05/18/2019 CLINICAL DATA:  [place] yesterday.  Pain. EXAM: LEFT FEMUR 2 VIEWS COMPARISON:  None. FINDINGS: There is no evidence of fracture or other focal bone lesions. Soft tissues are unremarkable. IMPRESSION: Negative. Electronically Signed   By: Nelson Chimes M.D.   On: 05/18/2019 21:41     Time Spent in minutes  30     Desiree Hane M.D on 05/20/2019 at 10:11 PM  To page go to [email or web address] - password [email or web address]

## 2019-05-21 ENCOUNTER — Inpatient Hospital Stay (HOSPITAL_COMMUNITY): Payer: Medicaid Other

## 2019-05-21 DIAGNOSIS — M25552 Pain in left hip: Secondary | ICD-10-CM

## 2019-05-21 LAB — GLUCOSE, CAPILLARY
Glucose-Capillary: 88 mg/dL (ref 70–99)
Glucose-Capillary: 122 mg/dL — ABNORMAL HIGH (ref 70–99)
Glucose-Capillary: 188 mg/dL — ABNORMAL HIGH (ref 70–99)
Glucose-Capillary: 65 mg/dL — ABNORMAL LOW (ref 70–99)
Glucose-Capillary: 78 mg/dL (ref 70–99)

## 2019-05-21 LAB — FERRITIN: Ferritin: 60 ng/mL (ref 11–307)

## 2019-05-21 LAB — COMPREHENSIVE METABOLIC PANEL
ALT: 10 U/L (ref 0–44)
AST: 13 U/L — ABNORMAL LOW (ref 15–41)
Albumin: 2.4 g/dL — ABNORMAL LOW (ref 3.5–5.0)
Alkaline Phosphatase: 69 U/L (ref 38–126)
Anion gap: 9 (ref 5–15)
BUN: 21 mg/dL (ref 8–23)
CO2: 25 mmol/L (ref 22–32)
Calcium: 8.5 mg/dL — ABNORMAL LOW (ref 8.9–10.3)
Chloride: 101 mmol/L (ref 98–111)
Creatinine, Ser: 0.85 mg/dL (ref 0.44–1.00)
GFR calc Af Amer: >60 mL/min (ref >60)
GFR calc non Af Amer: >60 mL/min (ref >60)
Glucose, Bld: 79 mg/dL (ref 70–99)
Potassium: 3.4 mmol/L — ABNORMAL LOW (ref 3.5–5.1)
Sodium: 135 mmol/L (ref 135–145)
Total Bilirubin: 0.9 mg/dL (ref 0.3–1.2)
Total Protein: 6 g/dL — ABNORMAL LOW (ref 6.5–8.1)

## 2019-05-21 LAB — C-REACTIVE PROTEIN: CRP: 1.3 mg/dL — ABNORMAL HIGH (ref <1.0)

## 2019-05-21 LAB — MAGNESIUM: Magnesium: 1.9 mg/dL (ref 1.7–2.4)

## 2019-05-21 LAB — PHOSPHORUS: Phosphorus: 3.7 mg/dL (ref 2.5–4.6)

## 2019-05-21 MED ORDER — POLYETHYLENE GLYCOL 3350 17 G PO PACK
17.0000 g | PACK | Freq: Two times a day (BID) | ORAL | Status: DC
  Administered 2019-05-21 – 2019-05-30 (×11): 17 g via ORAL
  Filled 2019-05-21 (×14): qty 1

## 2019-05-21 MED ORDER — SENNOSIDES-DOCUSATE SODIUM 8.6-50 MG PO TABS
2.0000 | ORAL_TABLET | Freq: Two times a day (BID) | ORAL | Status: DC
  Administered 2019-05-21 – 2019-05-30 (×15): 2 via ORAL
  Filled 2019-05-21 (×16): qty 2

## 2019-05-21 NOTE — Progress Notes (Signed)
[facility]  PROGRESS NOTE  Emily Hoover [id number] DOB: [birth year] DOA: 05/18/2019 PCP: Charlott Rakes, MD Admit date - 05/18/2019   Admitting Physician Norval Morton, MD  Outpatient Primary MD for the patient is Charlott Rakes, MD  LOS - 1 Brief Narrative   Emily Hoover is a 73 y.o. year old female with medical history significant for HTN, DM, myositis who presented on 05/18/2019 with left sided pain after fall 2 days prior to admission where she landed on her rib regio and was found to have multiple left sided rib fractures.   Hospital course complicated by persistent pain from rib cage fractures requiring IV pain control, COVID infection without symptoms, hypertensive urgency  Subjective  FNAME@ [facility] today has persistent left sided rib cage pain and left hip pain with very little improvement with IV morphine, breathing ok, no chest pain or cough, eating ok.  A & P    1. Multiple left-sided low rib fractures, limiting mobility due to persistent pain not responding to oral pain control including (as needed tramadol 50-100 mg every 6 hours p.o. as needed severe pain, scheduled Robaxin 1 mg 4 times daily, scheduled Tylenol 650 mg every 6 hours) still having 7 out of 10 pain at rest with 10 out of 10 pain with minimal exertion.  She is still requiring IV morphine for better pain control particularly in left hip, will obtain CT pelvis/hip for better evaluation, given assistance needed for ambulation and transfers per PT evaluation and family  unable to provide 24-hour Assistance PT recommends SNF level rehab to return patient to prior level of function.No surgical intervention per trauma surgery evaluation, encouraged incentive spirometry 2. Concern for probable left pubic ramus fracture on XR. Persistent pain despite IV pain control. will obtain CT pelvis/hip for better evaluation 3. Unwitnessed fall.  History limited due to language barrier despite using interpreter.  Has  history of vertigo/syncope. Check orthostatics in am 4. COVID-19 infection, asymptomatic.  Normal oxygen saturation on room air, continue vitamin C and zinc, closely monitor. 5. Large stool burden on CT abd imaging. Given on significant pain regimen with opioids will optimize bowel regimen 6. Type 2 diabetes, poorly controlled with hyperglycemia, A1c 11.7, on admission blood glucose of greater than 400 with elevated anion gap and pseudohyponatremia 133.  Holding home metformin, continue Lantus 20 units, CBGs at goal 7. Hypertension, stable at goal.  Continue home lisinopril and amlodipine, on admission blood pressure quite elevated 194/90 I related to pain secondary to rib fractures, 8. Hyperlipidemia, stable.  Continue home atorvastatin     Family Communication  :  No family at bedside  Code Status :  FULL Code  Disposition Plan  : Continue IV pain control, PT recommends SNF  Consults  : Trauma surgery  Procedures  : None  DVT Prophylaxis  :  Lovenox   Lab Results  Component Value Date   PLT 348 05/20/2019    Diet :  Diet Order            Diet Carb Modified Fluid consistency: Thin; Room service appropriate? Yes  Diet effective now               Inpatient Medications Scheduled Meds:  acetaminophen  650 mg Oral Q6H   amLODipine  10 mg Oral Daily   atorvastatin  20 mg Oral Daily   docusate sodium  100 mg Oral BID   enoxaparin (LOVENOX) injection  40 mg Subcutaneous Daily   insulin aspart  0-15 Units  Subcutaneous TID WC   insulin glargine  20 Units Subcutaneous QHS   lisinopril  5 mg Oral Daily   methocarbamol  1,000 mg Oral QID   polyethylene glycol  17 g Oral Daily   senna-docusate  2 tablet Oral BID   sodium chloride flush  3 mL Intravenous Q12H   vitamin C  500 mg Oral Daily   zinc sulfate  220 mg Oral Daily   Continuous Infusions: PRN Meds:.albuterol, guaiFENesin-dextromethorphan, labetalol, morphine injection, ondansetron **OR** ondansetron  (ZOFRAN) IV, traMADol  Antibiotics  :   Anti-infectives (From admission, onward)   None       Objective   Vitals:   05/20/19 0819 05/20/19 1706 05/20/19 2309 05/21/19 0800  BP: 132/69 138/71 130/68 130/70  Pulse: 79 75 65 86  Resp: 15 15 14    Temp:   97.6 F (36.4 C) 97.6 F (36.4 C)  TempSrc:   Oral Oral  SpO2: 96% 94% 94% 95%  Weight:        SpO2: 95 % O2 Flow Rate (L/min): 98 L/min  Wt Readings from Last 3 Encounters:  05/20/19 43 kg  10/06/18 45.4 kg  07/07/18 45.6 kg    No intake or output data in the 24 hours ending 05/21/19 1302  Physical Exam:  Sitting in bed, pain persists on left side Pain reproducible with palpation of left ribs, no bruising noted Reproducible pain with palpation of left hip No new F.N deficits,  Symmetrical Chest wall movement, Good air movement bilaterally on room air, CTAB RRR,No Gallops,Rubs or new Murmurs,     I have personally reviewed the following:   Data Reviewed:  CBC Recent Labs  Lab 05/19/19 0816 05/20/19 0413  WBC 7.1 5.3  HGB 12.5 9.8*  HCT 37.2 29.0*  PLT 389 348  MCV 88.8 89.0  MCH 29.8 30.1  MCHC 33.6 33.8  RDW 13.8 14.0  LYMPHSABS 1.0  --   MONOABS 0.4  --   EOSABS 0.0  --   BASOSABS 0.1  --     Chemistries  Recent Labs  Lab 05/19/19 0816 05/20/19 0413 05/21/19 0431  NA 133* 137 135  K 3.7 3.6 3.4*  CL 95* 103 101  CO2 25 25 25   GLUCOSE 413* 138* 79  BUN 21 21 21   CREATININE 0.78 1.08* 0.85  CALCIUM 9.9 8.4* 8.5*  MG  --  1.8 1.9  AST 16 11* 13*  ALT 15 12 10   ALKPHOS 107 73 69  BILITOT 1.2 0.8 0.9   ------------------------------------------------------------------------------------------------------------------ No results for input(s): CHOL, HDL, LDLCALC, TRIG, CHOLHDL, LDLDIRECT in the last 72 hours.  Lab Results  Component Value Date   HGBA1C 11.7 (H) 05/20/2019    ------------------------------------------------------------------------------------------------------------------ No results for input(s): TSH, T4TOTAL, T3FREE, THYROIDAB in the last 72 hours.  Invalid input(s): FREET3 ------------------------------------------------------------------------------------------------------------------ Recent Labs    05/20/19 0413 05/21/19 0431  FERRITIN 58 60    Coagulation profile No results for input(s): INR, PROTIME in the last 168 hours.  Recent Labs    05/20/19 0413  DDIMER 1.46*    Cardiac Enzymes No results for input(s): CKMB, TROPONINI, MYOGLOBIN in the last 168 hours.  Invalid input(s): CK ------------------------------------------------------------------------------------------------------------------ No results found for: BNP  Micro Results Recent Results (from the past 240 hour(s))  SARS CORONAVIRUS 2 (TAT 6-24 HRS) Nasopharyngeal Nasopharyngeal Swab     Status: Abnormal   Collection Time: 05/19/19 11:50 AM   Specimen: Nasopharyngeal Swab  Result Value Ref Range Status   SARS Coronavirus 2 POSITIVE (  A) NEGATIVE Final    Comment: RESULT CALLED TO, READ BACK BY AND VERIFIED WITH: C BAIN,RN 1819 05/19/2019 D BRADLEY (NOTE) SARS-CoV-2 target nucleic acids are DETECTED. The SARS-CoV-2 RNA is generally detectable in upper and lower respiratory specimens during the acute phase of infection. Positive results are indicative of active infection with SARS-CoV-2. Clinical  correlation with patient history and other diagnostic information is necessary to determine patient infection status. Positive results do  not rule out bacterial infection or co-infection with other viruses. The expected result is Negative. Fact Sheet for Patients: SugarRoll.be Fact Sheet for Healthcare Providers: https://www.woods-mathews.com/ This test is not yet approved or cleared by the [place] FDA and  has been  authorized for detection and/or diagnosis of SARS-CoV-2 by FDA under an Emergency Use Authorization (EUA). This EUA will remain  in effect (meaning this test can be used) for t he duration of the COVID-19 declaration under Section 564(b)(1) of the Act, 21 U.S.C. section 360bbb-3(b)(1), unless the authorization is terminated or revoked sooner. Performed at [facility], [place] [address], [place], [place] [address]     Radiology Reports Dg Ribs Unilateral W/chest Left  Result Date: 05/18/2019 CLINICAL DATA:  [place] yesterday.  Left-sided pain. EXAM: LEFT RIBS AND CHEST - 3+ VIEW COMPARISON:  10/06/2018 FINDINGS: Multiple lower left rib fractures. Fracture of the lateral ribs number 5, 6 and 7. Fracture of the posterior ribs numbers 10 and 11. No evidence of pneumothorax or hemothorax. IMPRESSION: Multiple left rib fractures. Lateral fractures numbers 5 through 7. Posterior fractures numbers 10 and 11. Electronically Signed   By: Nelson Chimes M.D.   On: 05/18/2019 21:48   Dg Pelvis 1-2 Views  Result Date: 05/18/2019 CLINICAL DATA:  [place] yesterday. Pain. EXAM: PELVIS - 1-2 VIEW COMPARISON:  None. FINDINGS: Question inferior ramus fracture on the left. No other pelvic ring disruption identified or suspected. IMPRESSION: Suspicion of inferior ramus fracture on the left. Electronically Signed   By: Nelson Chimes M.D.   On: 05/18/2019 21:44   Ct Head Wo Contrast  Result Date: 05/19/2019 CLINICAL DATA:  Pain after fall EXAM: CT HEAD WITHOUT CONTRAST TECHNIQUE: Contiguous axial images were obtained from the base of the skull through the vertex without intravenous contrast. COMPARISON:  01/19/2011 FINDINGS: Brain: No evidence of acute infarction, hemorrhage, hydrocephalus, extra-axial collection or mass lesion/mass effect. Vascular: Mild atherosclerotic calcifications involving the large vessels of the skull base. No unexpected hyperdense vessel. Skull: Normal. Negative for fracture or focal lesion.  Sinuses/Orbits: Unchanged left ethmoid osteoma. Paranasal sinuses and mastoid air cells are clear. Orbital structures unremarkable. Other: None. IMPRESSION: No acute intracranial abnormality. Electronically Signed   By: Davina Poke M.D.   On: 05/19/2019 09:04   Ct Chest W Contrast  Result Date: 05/19/2019 CLINICAL DATA:  [place] yesterday. Left-sided abdominal and flank pain. EXAM: CT CHEST, ABDOMEN, AND PELVIS WITH CONTRAST TECHNIQUE: Multidetector CT imaging of the chest, abdomen and pelvis was performed following the standard protocol during bolus administration of intravenous contrast. CONTRAST:  180mL OMNIPAQUE IOHEXOL 300 MG/ML  SOLN COMPARISON:  CT scan 09/16/2015 FINDINGS: CT CHEST FINDINGS Cardiovascular: The heart is normal in size. No pericardial effusion. The aorta is normal in caliber. There is moderate tortuosity and atherosclerotic calcification. No dissection. The branch vessels are patent. Scattered atherosclerotic calcifications. Scattered coronary artery calcifications are also noted. The pulmonary arteries appear normal. Mediastinum/Nodes: No mediastinal or hilar mass or adenopathy or hematoma. Lungs/Pleura: No acute pulmonary findings. No pulmonary contusion, pleural effusion or  pneumothorax. Dependent bibasilar atelectasis is noted. There are underlying emphysematous changes and pulmonary scarring. No worrisome pulmonary lesions. Musculoskeletal: No breast masses, supraclavicular or axillary adenopathy. The bony thorax is intact. No definite sternal fractures. The thoracic vertebral bodies are normally aligned. No acute fracture. There are left lower rib fractures. There is a nondisplaced eighth lateral rib fracture and nondisplaced eleventh and twelfth posterior rib fractures. CT ABDOMEN PELVIS FINDINGS Hepatobiliary: No focal hepatic lesions or acute hepatic injury. No perihepatic fluid collections. The portal and hepatic veins are patent. The gallbladder is grossly normal. No common  bile duct dilatation. Pancreas: No mass, inflammation or ductal dilatation. Moderate atrophy of the pancreatic body and tail regions. No acute pancreatic injury or peripancreatic fluid collection. Spleen: Normal size.  No focal lesions. Adrenals/Urinary Tract: The adrenal glands and kidneys are unremarkable. Small scattered renal cysts are noted. Mild renal cortical scarring changes. No collecting system abnormalities. The bladder appears normal. Stomach/Bowel: The stomach, duodenum and small bowel are unremarkable. No acute inflammatory changes, mass lesions or obstructive findings. There is a large amount of stool throughout the colon and down into the rectum suggesting constipation. No inflammatory changes or mass lesions. Vascular/Lymphatic: Moderate tortuosity and calcification of the abdominal aorta and iliac arteries but no aneurysm or dissection. The branch vessels are patent. The major venous structures are patent. No mesenteric or retroperitoneal mass or adenopathy. No hematoma. Reproductive: The uterus and ovaries are unremarkable. Other: No free pelvic fluid collections or pelvic hematoma. Musculoskeletal: The bony structures are intact. No acute fractures are identified. The hips are normally located. The pubic symphysis and SI joints are intact. 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 Cardiovascular: The heart is normal in size. IMPRESSION: 1. Left lower rib fractures likely accounting for the patient's left flank and side pain. No significant displacement and no associated pneumothorax or pleural hematoma. 2. Dependent bibasilar atelectasis but no worrisome pulmonary findings. 3. Normal appearance of the heart and great vessels. No mediastinal hematoma. 4. No acute intra-abdominal/intrapelvic abnormality. No free air or free fluid and the solid abdominal organs are  intact. 5. Large amount of stool throughout the colon suggesting constipation. 6. Intact bony pelvis. Electronically Signed   By: Marijo Sanes M.D.   On: 05/19/2019 13:25   Ct Abdomen Pelvis W Contrast  Result Date: 05/19/2019 CLINICAL DATA:  [place] yesterday. Left-sided abdominal and flank pain. EXAM: CT CHEST, ABDOMEN, AND PELVIS WITH CONTRAST TECHNIQUE: Multidetector CT imaging of the chest, abdomen and pelvis was performed following the standard protocol during bolus administration of intravenous contrast. CONTRAST:  150mL OMNIPAQUE IOHEXOL 300 MG/ML  SOLN COMPARISON:  CT scan 09/16/2015 FINDINGS: CT CHEST FINDINGS Cardiovascular: The heart is normal in size. No pericardial effusion. The aorta is normal in caliber. There is moderate tortuosity and atherosclerotic calcification. No dissection. The branch vessels are patent. Scattered atherosclerotic calcifications. Scattered coronary artery calcifications are also noted. The pulmonary arteries appear normal. Mediastinum/Nodes: No mediastinal or hilar mass or adenopathy or hematoma. Lungs/Pleura: No acute pulmonary findings. No pulmonary contusion, pleural effusion or pneumothorax. Dependent bibasilar atelectasis is noted. There are underlying emphysematous changes and pulmonary scarring. No worrisome pulmonary lesions. Musculoskeletal: No breast masses, supraclavicular or axillary adenopathy. The bony thorax is intact. No definite sternal fractures. The thoracic vertebral bodies are normally aligned. No acute fracture. There are left lower rib fractures. There is a nondisplaced eighth lateral rib  fracture and nondisplaced eleventh and twelfth posterior rib fractures. CT ABDOMEN PELVIS FINDINGS Hepatobiliary: No focal hepatic lesions or acute hepatic injury. No perihepatic fluid collections. The portal and hepatic veins are patent. The gallbladder is grossly normal. No common bile duct dilatation. Pancreas: No mass, inflammation or ductal dilatation. Moderate  atrophy of the pancreatic body and tail regions. No acute pancreatic injury or peripancreatic fluid collection. Spleen: Normal size.  No focal lesions. Adrenals/Urinary Tract: The adrenal glands and kidneys are unremarkable. Small scattered renal cysts are noted. Mild renal cortical scarring changes. No collecting system abnormalities. The bladder appears normal. Stomach/Bowel: The stomach, duodenum and small bowel are unremarkable. No acute inflammatory changes, mass lesions or obstructive findings. There is a large amount of stool throughout the colon and down into the rectum suggesting constipation. No inflammatory changes or mass lesions. Vascular/Lymphatic: Moderate tortuosity and calcification of the abdominal aorta and iliac arteries but no aneurysm or dissection. The branch vessels are patent. The major venous structures are patent. No mesenteric or retroperitoneal mass or adenopathy. No hematoma. Reproductive: The uterus and ovaries are unremarkable. Other: No free pelvic fluid collections or pelvic hematoma. Musculoskeletal: The bony structures are intact. No acute fractures are identified. The hips are normally located. The pubic symphysis and SI joints are intact. 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 Cardiovascular: The heart is normal in size. IMPRESSION: 1. Left lower rib fractures likely accounting for the patient's left flank and side pain. No significant displacement and no associated pneumothorax or pleural hematoma. 2. Dependent bibasilar atelectasis but no worrisome pulmonary findings. 3. Normal appearance of the heart and great vessels. No mediastinal hematoma. 4. No acute intra-abdominal/intrapelvic abnormality. No free air or free fluid and the solid abdominal organs are intact. 5. Large amount of stool throughout the colon suggesting constipation. 6.  Intact bony pelvis. Electronically Signed   By: Marijo Sanes M.D.   On: 05/19/2019 13:25   Dg Knee Complete 4 Views Left  Result Date: 05/18/2019 CLINICAL DATA:  [place] yesterday. Knee pain. EXAM: LEFT KNEE - COMPLETE 4+ VIEW COMPARISON:  None. FINDINGS: No evidence of fracture or joint effusion. 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 IMPRESSION: No acute or traumatic finding. 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 Electronically Signed   By: Nelson Chimes M.D.   On: 05/18/2019 21:40   Dg Femur Min 2 Views Left  Result Date: 05/18/2019 CLINICAL DATA:  [place] yesterday.  Pain. EXAM: LEFT FEMUR 2 VIEWS COMPARISON:  None. FINDINGS: There is no evidence of fracture or other focal bone lesions. Soft tissues are unremarkable. IMPRESSION: Negative. Electronically Signed   By: Nelson Chimes M.D.   On: 05/18/2019 21:41     Time Spent in minutes  30     Desiree Hane M.D on 05/21/2019 at 1:02 PM  To page go to [email or web address] - password [email or web address]

## 2019-05-21 NOTE — Plan of Care (Signed)
  Problem: Clinical Measurements: Goal: Diagnostic test results will improve Outcome: Progressing   

## 2019-05-21 NOTE — Progress Notes (Signed)
Patient escorted by [facility] to CT scan, scan neg for fracture, patient cleaned and made comfortable incontinent of urine purwick in use. VSS no complaint of pain, facial grimace on movement:no edema noted to pelvis or bruising.  Alert and well related, emotional support given.

## 2019-05-21 NOTE — Progress Notes (Signed)
Return from CT, CT neg fo acute findings. Meds given per order , made comfortable. Offered no complaints will continue to observe and monitor for change .

## 2019-05-22 DIAGNOSIS — M25559 Pain in unspecified hip: Secondary | ICD-10-CM

## 2019-05-22 LAB — CBC
HCT: 31.1 % — ABNORMAL LOW (ref 36.0–46.0)
Hemoglobin: 10.4 g/dL — ABNORMAL LOW (ref 12.0–15.0)
MCH: 29.6 pg (ref 26.0–34.0)
MCHC: 33.4 g/dL (ref 30.0–36.0)
MCV: 88.6 fL (ref 80.0–100.0)
Platelets: 336 10*3/uL (ref 150–400)
RBC: 3.51 MIL/uL — ABNORMAL LOW (ref 3.87–5.11)
RDW: 13.8 % (ref 11.5–15.5)
WBC: 8.4 10*3/uL (ref 4.0–10.5)
nRBC: 0 % (ref 0.0–0.2)

## 2019-05-22 LAB — COMPREHENSIVE METABOLIC PANEL
ALT: 12 U/L (ref 0–44)
AST: 16 U/L (ref 15–41)
Albumin: 2.5 g/dL — ABNORMAL LOW (ref 3.5–5.0)
Alkaline Phosphatase: 79 U/L (ref 38–126)
Anion gap: 9 (ref 5–15)
BUN: 23 mg/dL (ref 8–23)
CO2: 27 mmol/L (ref 22–32)
Calcium: 8.8 mg/dL — ABNORMAL LOW (ref 8.9–10.3)
Chloride: 98 mmol/L (ref 98–111)
Creatinine, Ser: 0.57 mg/dL (ref 0.44–1.00)
GFR calc Af Amer: >60 mL/min (ref >60)
GFR calc non Af Amer: >60 mL/min (ref >60)
Glucose, Bld: 63 mg/dL — ABNORMAL LOW (ref 70–99)
Potassium: 3.7 mmol/L (ref 3.5–5.1)
Sodium: 134 mmol/L — ABNORMAL LOW (ref 135–145)
Total Bilirubin: 0.8 mg/dL (ref 0.3–1.2)
Total Protein: 6.3 g/dL — ABNORMAL LOW (ref 6.5–8.1)

## 2019-05-22 LAB — FERRITIN: Ferritin: 65 ng/mL (ref 11–307)

## 2019-05-22 LAB — GLUCOSE, CAPILLARY
Glucose-Capillary: 135 mg/dL — ABNORMAL HIGH (ref 70–99)
Glucose-Capillary: 57 mg/dL — ABNORMAL LOW (ref 70–99)
Glucose-Capillary: 91 mg/dL (ref 70–99)
Glucose-Capillary: 142 mg/dL — ABNORMAL HIGH (ref 70–99)
Glucose-Capillary: 77 mg/dL (ref 70–99)
Glucose-Capillary: 169 mg/dL — ABNORMAL HIGH (ref 70–99)

## 2019-05-22 LAB — PHOSPHORUS: Phosphorus: 4 mg/dL (ref 2.5–4.6)

## 2019-05-22 LAB — MAGNESIUM: Magnesium: 2 mg/dL (ref 1.7–2.4)

## 2019-05-22 LAB — C-REACTIVE PROTEIN: CRP: 3.8 mg/dL — ABNORMAL HIGH (ref <1.0)

## 2019-05-22 MED ORDER — LACTULOSE ENEMA
300.0000 mL | Freq: Once | ORAL | Status: AC
  Administered 2019-05-22: 300 mL via RECTAL
  Filled 2019-05-22: qty 300

## 2019-05-22 MED ORDER — INSULIN GLARGINE 100 UNIT/ML ~~LOC~~ SOLN
15.0000 [IU] | Freq: Every day | SUBCUTANEOUS | Status: DC
  Administered 2019-05-22: 15 [IU] via SUBCUTANEOUS
  Filled 2019-05-22 (×2): qty 0.15

## 2019-05-22 NOTE — Progress Notes (Signed)
Daily Progress Note  Pt  Is A/O x4 and has been c/o pain on the left side on ribs and on hip that radiates down to the knee. Pt I awaiting MRI of the left femur. Pt attempted to sit in chair today but had pain 10/10 each time and could not tolerate activity. Pt has 20G peripheral IV that is flushing well and saline locked. Pt is on RA. Pt is incontinent of urine and stool Pt has been pleasant and receptive of care throughout the day

## 2019-05-22 NOTE — Progress Notes (Signed)
Inpatient Diabetes Program Recommendations  AACE/ADA: New Consensus Statement on Inpatient Glycemic Control (2015)  Target Ranges:  Prepandial:   less than 140 mg/dL      Peak postprandial:   less than 180 mg/dL (1-2 hours)      Critically ill patients:  140 - 180 mg/dL   Lab Results  Component Value Date   GLUCAP 142 (H) 05/22/2019   HGBA1C 11.7 (H) 05/20/2019    Review of Glycemic Control Results for Emily Hoover, Emily Hoover (MRN [id number]) as of 05/22/2019 13:12  Ref. Range 05/22/2019 01:29 05/22/2019 07:36 05/22/2019 08:06 05/22/2019 12:07  Glucose-Capillary Latest Ref Range: 70 - 99 mg/dL 135 (H) 57 (L) 91 142 (H)  Diabetes history: DM 2 Outpatient Diabetes medications: Metformin 1000 mg bid, Lantus 20 units daily Current orders for Inpatient glycemic control:  Novolog moderate tid with meals Lantus 15 units q HS  Inpatient Diabetes Program Recommendations:    Note low blood sugar this am.  Basal insulin reduced appropriately.  Patient not appropriate for education at this time.  Note likely d/c to SNF.  A1C is > than goal but improved from last check.  Will follow.   Thanks Adah Perl, RN, BC-ADM Inpatient Diabetes Coordinator Pager [phone] (8a-5p)

## 2019-05-22 NOTE — Progress Notes (Signed)
Physical Therapy Treatment Patient Details Name: Emily Hoover MRN: [id number] DOB: 01-12-1946 Today's Date: 05/22/2019    History of Present Illness 73 y.o. female with medical history significant of hypertension, hyperlipidemia, and uncontrolled diabetes mellitus type 2.  She presented last night with complaints of left-sided pain after having a fall 05/18/19. Due to difficulty with translation cause of fall unclear, chart reveals hx vertigo/syncope. Presented to ED 05/19/19, chest xray revealed fx of L lateral ribs 5-7 and L posterior ribs 10-11. Also found to have BP 194/90 EKG reveals first degree heart block. COVID (+).     PT Comments    Pt in bed laying on R hip with pillows supporting her back and L hip and thigh. Pt noted to have knot on outside of L upper leg which is painful to the touch. Pt requested to get up to the [facility] to urinate. Pt rolls with min guard to her R however requires maxA for bringing LE off bed and trunk to upright. Attempt to pivot on R LE to [facility] however pt unable to clear hips due to 10/10 pain. Notified physician. D/c plans remain appropriate. PT will continue to follow acutely.  Follow Up Recommendations  SNF     Equipment Recommendations  Other (comment)(TBD at next venue)       Precautions / Restrictions Precautions Precautions: Fall Restrictions Weight Bearing Restrictions: No    Mobility  Bed Mobility Overal bed mobility: Needs Assistance Bed Mobility: Rolling;Sidelying to Sit Rolling: Min guard Sidelying to sit: Max assist       General bed mobility comments: min guard for rolling onto L side but MaxA for bringing LE off bed and for bring trunk to upright  Transfers Overall transfer level: Needs assistance Equipment used: None Transfers: Squat Pivot Transfers           General transfer comment: pt request to use BSC unable to lift hips off of bed surface for transfer due to pain       Balance Overall balance assessment: Needs  assistance Sitting-balance support: No upper extremity supported;Feet supported Sitting balance-Leahy Scale: Fair                                      Cognition Arousal/Alertness: Awake/alert Behavior During Therapy: WFL for tasks assessed/performed Overall Cognitive Status: No family/caregiver present to determine baseline cognitive functioning                                           General Comments General comments (skin integrity, edema, etc.): visible knot on lateral aspect of L leg near lesser trochanter, painful to the touch       Pertinent Vitals/Pain Pain Assessment: 0-10 Pain Score: 10-Worst pain ever Pain Location: L hip Pain Descriptors / Indicators: Grimacing;Moaning;Crying;Guarding Pain Intervention(s): Limited activity within patient's tolerance;Monitored during session;Repositioned;Premedicated before session    Home Living Family/patient expects to be discharged to:: Private residence Living Arrangements: Children(Son) Available Help at Discharge: Family;Available PRN/intermittently Type of Home: Mobile home Home Access: Stairs to enter Entrance Stairs-Rails: Right;Left;Can reach both Home Layout: One level Home Equipment: Bedside commode      Prior Function Level of Independence: Independent          PT Goals (current goals can now be found in the care plan section) Acute Rehab PT  Goals Patient Stated Goal: Stop this pain PT Goal Formulation: With patient Time For Goal Achievement: 06/03/19 Potential to Achieve Goals: Fair    Frequency    Min 2X/week      PT Plan Current plan remains appropriate    Co-evaluation              AM-PAC PT "6 Clicks" Mobility   Outcome Measure  Help needed turning from your back to your side while in a flat bed without using bedrails?: A Little Help needed moving from lying on your back to sitting on the side of a flat bed without using bedrails?: A Lot Help needed  moving to and from a bed to a chair (including a wheelchair)?: A Little Help needed standing up from a chair using your arms (e.g., wheelchair or bedside chair)?: A Little Help needed to walk in hospital room?: A Little Help needed climbing 3-5 steps with a railing? : A Lot 6 Click Score: 16    End of Session   Activity Tolerance: Patient limited by pain Patient left: in chair;with call bell/phone within reach Nurse Communication: Mobility status PT Visit Diagnosis: Unsteadiness on feet (R26.81);Other abnormalities of gait and mobility (R26.89);Muscle weakness (generalized) (M62.81);History of falling (Z91.81);Difficulty in walking, not elsewhere classified (R26.2);Pain Pain - Right/Left: Left Pain - part of body: Hip(ribs)     Time: [id number] PT Time Calculation (min) (ACUTE ONLY): 26 min  Charges:  $Therapeutic Activity: 23-37 mins                     Amairany Schumpert B. Migdalia Dk PT, DPT Acute Rehabilitation Services Pager [phone] Office [phone]    [place] 05/22/2019, 4:53 PM

## 2019-05-22 NOTE — Progress Notes (Signed)
[facility]  PROGRESS NOTE  Emily Hoover [id number] DOB: 08-Dec-1945 DOA: 05/18/2019 PCP: Charlott Rakes, MD Admit date - 05/18/2019   Admitting Physician Norval Morton, MD  Outpatient Primary MD for the patient is Charlott Rakes, MD  LOS - 2 Brief Narrative   Emily Hoover is a 73 y.o. year old female with medical history significant for HTN, DM, myositis who presented on 05/18/2019 with left sided pain after fall 2 days prior to admission where she landed on her rib regio and was found to have multiple left sided rib fractures.   on admission blood glucose of greater than 400 without anion gap and pseudohyponatremia 133.   Hospital course complicated by persistent pain from rib cage fractures requiring IV pain control, COVID infection without symptoms, hypertensive urgency  Subjective  FNAME@ [name] today has persistent left sided rib cage pain and left hip pain with very little improvement with IV morphine, breathing ok, no chest pain or cough, eating ok.  A & P    1. Multiple left-sided low rib fractures, persistent pain with minimal movement. Unable to even situp at bedside and work with therapy but localizing severe point tenderness pain to left hip/gluteal area despite multiple doses of IV morphine ( 5 mg total so far since this am). Discussed with radiology given negative XR femur and CT pelvis rec MRI pelvis and left femur to rule out musculoskeletal etiology vs fracture. Continue pain control (as needed tramadol 50-100 mg every 6 hours p.o. as needed severe pain, scheduled Robaxin 1 mg 4 times daily, scheduled Tylenol 650 mg every 6 hours) IV morphine 1 mg q 3 hrs severe pain PRN. Ecouraged incentive spirometry  2. Concern for probable left pubic ramus fracture on XR. Persistent pain despite IV pain control. will obtain MRI pelvis/hip for better evaluation given unremarkable CT  3. Unwitnessed fall.  History limited due to language barrier despite using interpreter.   Has history of vertigo/syncope. Unable to check orthostatics as patient unable to bear weight due to #1 and 2  4. COVID-19 infection, asymptomatic.  Normal oxygen saturation on room air, continue vitamin C and zinc, closely monitor.  5. Large stool burden on CT abd imaging. Given on significant pain regimen with opioids continue bowel regimen  6. Type 2 diabetes, poorly controlled with hyperglycemia, A1c 11.7, Glucose of 57 this am, Holding home metformin, Decrease to Lantus 15 U, monitor CBGs at goal  7. Hypertension, stable at goal.  Continue home lisinopril and amlodipine, on admission blood pressure quite elevated 194/90 I related to pain secondary to rib fractures,  8. Hyperlipidemia, stable.  Continue home atorvastatin     Family Communication  :  No family at bedside  Code Status :  FULL Code  Disposition Plan  : Rule out hip fracture with MRIContinue IV pain control, PT recommends SNF given limited mobility and family unable to provide 24 h assistance to return ateint to prior level of function  Consults  : Trauma surgery  Procedures  : None  DVT Prophylaxis  :  Lovenox   Lab Results  Component Value Date   PLT 348 05/20/2019    Diet :  Diet Order            Diet Carb Modified Fluid consistency: Thin; Room service appropriate? Yes  Diet effective now               Inpatient Medications Scheduled Meds:  acetaminophen  650 mg Oral Q6H   amLODipine  10  mg Oral Daily   atorvastatin  20 mg Oral Daily   enoxaparin (LOVENOX) injection  40 mg Subcutaneous Daily   insulin aspart  0-15 Units Subcutaneous TID WC   insulin glargine  15 Units Subcutaneous QHS   lisinopril  5 mg Oral Daily   methocarbamol  1,000 mg Oral QID   polyethylene glycol  17 g Oral BID   senna-docusate  2 tablet Oral BID   sodium chloride flush  3 mL Intravenous Q12H   vitamin C  500 mg Oral Daily   zinc sulfate  220 mg Oral Daily   Continuous Infusions: PRN Meds:.albuterol,  guaiFENesin-dextromethorphan, labetalol, morphine injection, ondansetron **OR** ondansetron (ZOFRAN) IV, traMADol  Antibiotics  :   Anti-infectives (From admission, onward)   None       Objective   Vitals:   05/21/19 1554 05/21/19 2000 05/21/19 2343 05/22/19 0741  BP: 125/65 (!) 144/80 (!) 148/68 130/71  Pulse:  83 78 89  Resp: 20 20 (!) 21   Temp: (!) 96.7 F (35.9 C) (!) 97 F (36.1 C) 97.8 F (36.6 C) 98 F (36.7 C)  TempSrc: Axillary Axillary Oral   SpO2: 96% 96% 98% 100%  Weight:        SpO2: 100 % O2 Flow Rate (L/min): 98 L/min  Wt Readings from Last 3 Encounters:  05/20/19 43 kg  10/06/18 45.4 kg  07/07/18 45.6 kg     Intake/Output Summary (Last 24 hours) at 05/22/2019 1532 Last data filed at 05/22/2019 0830 Gross per 24 hour  Intake 360 ml  Output 175 ml  Net 185 ml    Physical Exam:  Sitting in bed, pain persists on left side (including rib cage, left hip, left buttocks) Pain reproducible with palpation of left ribs, no bruising noted Reproducible pain with palpation of left hip No new F.N deficits,  Symmetrical Chest wall movement, Good air movement bilaterally on room air, CTAB RRR,No Gallops,Rubs or new Murmurs,     I have personally reviewed the following:   Data Reviewed:  CBC Recent Labs  Lab 05/19/19 0816 05/20/19 0413  WBC 7.1 5.3  HGB 12.5 9.8*  HCT 37.2 29.0*  PLT 389 348  MCV 88.8 89.0  MCH 29.8 30.1  MCHC 33.6 33.8  RDW 13.8 14.0  LYMPHSABS 1.0  --   MONOABS 0.4  --   EOSABS 0.0  --   BASOSABS 0.1  --     Chemistries  Recent Labs  Lab 05/19/19 0816 05/20/19 0413 05/21/19 0431 05/22/19 0406  NA 133* 137 135 134*  K 3.7 3.6 3.4* 3.7  CL 95* 103 101 98  CO2 25 25 25 27   GLUCOSE 413* 138* 79 63*  BUN 21 21 21 23   CREATININE 0.78 1.08* 0.85 0.57  CALCIUM 9.9 8.4* 8.5* 8.8*  MG  --  1.8 1.9 2.0  AST 16 11* 13* 16  ALT 15 12 10 12   ALKPHOS 107 73 69 79  BILITOT 1.2 0.8 0.9 0.8    ------------------------------------------------------------------------------------------------------------------ No results for input(s): CHOL, HDL, LDLCALC, TRIG, CHOLHDL, LDLDIRECT in the last 72 hours.  Lab Results  Component Value Date   HGBA1C 11.7 (H) 05/20/2019   ------------------------------------------------------------------------------------------------------------------ No results for input(s): TSH, T4TOTAL, T3FREE, THYROIDAB in the last 72 hours.  Invalid input(s): FREET3 ------------------------------------------------------------------------------------------------------------------ Recent Labs    05/21/19 0431 05/22/19 0406  FERRITIN 60 65    Coagulation profile No results for input(s): INR, PROTIME in the last 168 hours.  Recent Labs  05/20/19 0413  DDIMER 1.46*    Cardiac Enzymes No results for input(s): CKMB, TROPONINI, MYOGLOBIN in the last 168 hours.  Invalid input(s): CK ------------------------------------------------------------------------------------------------------------------ No results found for: BNP  Micro Results Recent Results (from the past 240 hour(s))  SARS CORONAVIRUS 2 (TAT 6-24 HRS) Nasopharyngeal Nasopharyngeal Swab     Status: Abnormal   Collection Time: 05/19/19 11:50 AM   Specimen: Nasopharyngeal Swab  Result Value Ref Range Status   SARS Coronavirus 2 POSITIVE (A) NEGATIVE Final    Comment: RESULT CALLED TO, READ BACK BY AND VERIFIED WITH: C BAIN,RN 1819 05/19/2019 D BRADLEY (NOTE) SARS-CoV-2 target nucleic acids are DETECTED. The SARS-CoV-2 RNA is generally detectable in upper and lower respiratory specimens during the acute phase of infection. Positive results are indicative of active infection with SARS-CoV-2. Clinical  correlation with patient history and other diagnostic information is necessary to determine patient infection status. Positive results do  not rule out bacterial infection or co-infection with  other viruses. The expected result is Negative. Fact Sheet for Patients: SugarRoll.be Fact Sheet for Healthcare Providers: https://www.woods-mathews.com/ This test is not yet approved or cleared by the [place] FDA and  has been authorized for detection and/or diagnosis of SARS-CoV-2 by FDA under an Emergency Use Authorization (EUA). This EUA will remain  in effect (meaning this test can be used) for t he duration of the COVID-19 declaration under Section 564(b)(1) of the Act, 21 U.S.C. section 360bbb-3(b)(1), unless the authorization is terminated or revoked sooner. Performed at [facility], [place] [address], [place], [place] [address]     Radiology Reports Dg Ribs Unilateral W/chest Left  Result Date: 05/18/2019 CLINICAL DATA:  [place] yesterday.  Left-sided pain. EXAM: LEFT RIBS AND CHEST - 3+ VIEW COMPARISON:  10/06/2018 FINDINGS: Multiple lower left rib fractures. Fracture of the lateral ribs number 5, 6 and 7. Fracture of the posterior ribs numbers 10 and 11. No evidence of pneumothorax or hemothorax. IMPRESSION: Multiple left rib fractures. Lateral fractures numbers 5 through 7. Posterior fractures numbers 10 and 11. Electronically Signed   By: Nelson Chimes M.D.   On: 05/18/2019 21:48   Dg Pelvis 1-2 Views  Result Date: 05/18/2019 CLINICAL DATA:  [place] yesterday. Pain. EXAM: PELVIS - 1-2 VIEW COMPARISON:  None. FINDINGS: Question inferior ramus fracture on the left. No other pelvic ring disruption identified or suspected. IMPRESSION: Suspicion of inferior ramus fracture on the left. Electronically Signed   By: Nelson Chimes M.D.   On: 05/18/2019 21:44   Ct Head Wo Contrast  Result Date: 05/19/2019 CLINICAL DATA:  Pain after fall EXAM: CT HEAD WITHOUT CONTRAST TECHNIQUE: Contiguous axial images were obtained from the base of the skull through the vertex without intravenous contrast. COMPARISON:  01/19/2011 FINDINGS: Brain: No evidence  of acute infarction, hemorrhage, hydrocephalus, extra-axial collection or mass lesion/mass effect. Vascular: Mild atherosclerotic calcifications involving the large vessels of the skull base. No unexpected hyperdense vessel. Skull: Normal. Negative for fracture or focal lesion. Sinuses/Orbits: Unchanged left ethmoid osteoma. Paranasal sinuses and mastoid air cells are clear. Orbital structures unremarkable. Other: None. IMPRESSION: No acute intracranial abnormality. Electronically Signed   By: Davina Poke M.D.   On: 05/19/2019 09:04   Ct Chest W Contrast  Result Date: 05/19/2019 CLINICAL DATA:  [place] yesterday. Left-sided abdominal and flank pain. EXAM: CT CHEST, ABDOMEN, AND PELVIS WITH CONTRAST TECHNIQUE: Multidetector CT imaging of the chest, abdomen and pelvis was performed following the standard protocol during bolus administration of intravenous contrast. CONTRAST:  163mL OMNIPAQUE IOHEXOL 300 MG/ML  SOLN COMPARISON:  CT scan 09/16/2015 FINDINGS: CT CHEST FINDINGS Cardiovascular: The heart is normal in size. No pericardial effusion. The aorta is normal in caliber. There is moderate tortuosity and atherosclerotic calcification. No dissection. The branch vessels are patent. Scattered atherosclerotic calcifications. Scattered coronary artery calcifications are also noted. The pulmonary arteries appear normal. Mediastinum/Nodes: No mediastinal or hilar mass or adenopathy or hematoma. Lungs/Pleura: No acute pulmonary findings. No pulmonary contusion, pleural effusion or pneumothorax. Dependent bibasilar atelectasis is noted. There are underlying emphysematous changes and pulmonary scarring. No worrisome pulmonary lesions. Musculoskeletal: No breast masses, supraclavicular or axillary adenopathy. The bony thorax is intact. No definite sternal fractures. The thoracic vertebral bodies are normally aligned. No acute fracture. There are left lower rib fractures. There is a nondisplaced eighth lateral rib  fracture and nondisplaced eleventh and twelfth posterior rib fractures. CT ABDOMEN PELVIS FINDINGS Hepatobiliary: No focal hepatic lesions or acute hepatic injury. No perihepatic fluid collections. The portal and hepatic veins are patent. The gallbladder is grossly normal. No common bile duct dilatation. Pancreas: No mass, inflammation or ductal dilatation. Moderate atrophy of the pancreatic body and tail regions. No acute pancreatic injury or peripancreatic fluid collection. Spleen: Normal size.  No focal lesions. Adrenals/Urinary Tract: The adrenal glands and kidneys are unremarkable. Small scattered renal cysts are noted. Mild renal cortical scarring changes. No collecting system abnormalities. The bladder appears normal. Stomach/Bowel: The stomach, duodenum and small bowel are unremarkable. No acute inflammatory changes, mass lesions or obstructive findings. There is a large amount of stool throughout the colon and down into the rectum suggesting constipation. No inflammatory changes or mass lesions. Vascular/Lymphatic: Moderate tortuosity and calcification of the abdominal aorta and iliac arteries but no aneurysm or dissection. The branch vessels are patent. The major venous structures are patent. No mesenteric or retroperitoneal mass or adenopathy. No hematoma. Reproductive: The uterus and ovaries are unremarkable. Other: No free pelvic fluid collections or pelvic hematoma. Musculoskeletal: The bony structures are intact. No acute fractures are identified. The hips are normally located. The pubic symphysis and SI joints are intact. 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 Cardiovascular: The heart is normal in size. IMPRESSION: 1. Left lower rib fractures likely accounting for the patient's left flank and side pain. No significant displacement and no associated pneumothorax or  pleural hematoma. 2. Dependent bibasilar atelectasis but no worrisome pulmonary findings. 3. Normal appearance of the heart and great vessels. No mediastinal hematoma. 4. No acute intra-abdominal/intrapelvic abnormality. No free air or free fluid and the solid abdominal organs are intact. 5. Large amount of stool throughout the colon suggesting constipation. 6. Intact bony pelvis. Electronically Signed   By: Marijo Sanes M.D.   On: 05/19/2019 13:25   Ct Pelvis Wo Contrast  Result Date: 05/21/2019 CLINICAL DATA:  Possible left inferior pubic ramus fracture seen on pelvis x-ray, not confirmed on the CT the abdomen and pelvis from May 19, 2019. Pain. EXAM: CT PELVIS WITHOUT CONTRAST TECHNIQUE: Multidetector CT imaging of the pelvis was performed following the standard protocol without intravenous contrast. COMPARISON:  None. FINDINGS: Urinary Tract: The bladder is filled with contrast but otherwise normal. Bowel: Fecal loading seen in the colon. Visualized appendix is normal. Visualized loops of small bowel are normal. Vascular/Lymphatic: Atherosclerotic changes are seen in the aorta and iliac vessels. No adenopathy. Reproductive:  No mass or other significant  abnormality Other:  None. Musculoskeletal: The iliac bones are normal. Specifically, the inferior left pubic ramus is normal with no acute fracture. Degenerative changes are seen in the pubic symphysis. The proximal femurs are intact. The acetabular intact. The SI joints and sacrum are intact. Bilateral L5 pars defects are noted with grade 1 anterolisthesis of L5 versus S1. IMPRESSION: 1. Bilateral L5 pars defects are noted with grade 1 anterolisthesis of L5 versus S1. No other fractures noted. 2. Moderate fecal loading throughout the visualized colon. 3. Atherosclerotic changes in the aorta and iliac vessels. Electronically Signed   By: Dorise Bullion III M.D   On: 05/21/2019 20:44   Ct Abdomen Pelvis W Contrast  Result Date: 05/19/2019 CLINICAL  DATA:  [place] yesterday. Left-sided abdominal and flank pain. EXAM: CT CHEST, ABDOMEN, AND PELVIS WITH CONTRAST TECHNIQUE: Multidetector CT imaging of the chest, abdomen and pelvis was performed following the standard protocol during bolus administration of intravenous contrast. CONTRAST:  12mL OMNIPAQUE IOHEXOL 300 MG/ML  SOLN COMPARISON:  CT scan 09/16/2015 FINDINGS: CT CHEST FINDINGS Cardiovascular: The heart is normal in size. No pericardial effusion. The aorta is normal in caliber. There is moderate tortuosity and atherosclerotic calcification. No dissection. The branch vessels are patent. Scattered atherosclerotic calcifications. Scattered coronary artery calcifications are also noted. The pulmonary arteries appear normal. Mediastinum/Nodes: No mediastinal or hilar mass or adenopathy or hematoma. Lungs/Pleura: No acute pulmonary findings. No pulmonary contusion, pleural effusion or pneumothorax. Dependent bibasilar atelectasis is noted. There are underlying emphysematous changes and pulmonary scarring. No worrisome pulmonary lesions. Musculoskeletal: No breast masses, supraclavicular or axillary adenopathy. The bony thorax is intact. No definite sternal fractures. The thoracic vertebral bodies are normally aligned. No acute fracture. There are left lower rib fractures. There is a nondisplaced eighth lateral rib fracture and nondisplaced eleventh and twelfth posterior rib fractures. CT ABDOMEN PELVIS FINDINGS Hepatobiliary: No focal hepatic lesions or acute hepatic injury. No perihepatic fluid collections. The portal and hepatic veins are patent. The gallbladder is grossly normal. No common bile duct dilatation. Pancreas: No mass, inflammation or ductal dilatation. Moderate atrophy of the pancreatic body and tail regions. No acute pancreatic injury or peripancreatic fluid collection. Spleen: Normal size.  No focal lesions. Adrenals/Urinary Tract: The adrenal glands and kidneys are unremarkable. Small scattered  renal cysts are noted. Mild renal cortical scarring changes. No collecting system abnormalities. The bladder appears normal. Stomach/Bowel: The stomach, duodenum and small bowel are unremarkable. No acute inflammatory changes, mass lesions or obstructive findings. There is a large amount of stool throughout the colon and down into the rectum suggesting constipation. No inflammatory changes or mass lesions. Vascular/Lymphatic: Moderate tortuosity and calcification of the abdominal aorta and iliac arteries but no aneurysm or dissection. The branch vessels are patent. The major venous structures are patent. No mesenteric or retroperitoneal mass or adenopathy. No hematoma. Reproductive: The uterus and ovaries are unremarkable. Other: No free pelvic fluid collections or pelvic hematoma. Musculoskeletal: The bony structures are intact. No acute fractures are identified. The hips are normally located. The pubic symphysis and SI joints are intact. 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 Cardiovascular: The heart is normal in size. IMPRESSION: 1. Left lower rib fractures likely accounting for the patient's left flank and side pain. No significant displacement and no associated pneumothorax or pleural hematoma. 2. Dependent bibasilar atelectasis but no worrisome pulmonary findings. 3. Normal appearance of the  heart and great vessels. No mediastinal hematoma. 4. No acute intra-abdominal/intrapelvic abnormality. No free air or free fluid and the solid abdominal organs are intact. 5. Large amount of stool throughout the colon suggesting constipation. 6. Intact bony pelvis. Electronically Signed   By: Marijo Sanes M.D.   On: 05/19/2019 13:25   Dg Knee Complete 4 Views Left  Result Date: 05/18/2019 CLINICAL DATA:  [place] yesterday. Knee pain. EXAM: LEFT KNEE - COMPLETE 4+ VIEW COMPARISON:   None. FINDINGS: No evidence of fracture or joint effusion. 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 IMPRESSION: No acute or traumatic finding. 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 Electronically Signed   By: Nelson Chimes M.D.   On: 05/18/2019 21:40   Dg Femur Min 2 Views Left  Result Date: 05/18/2019 CLINICAL DATA:  [place] yesterday.  Pain. EXAM: LEFT FEMUR 2 VIEWS COMPARISON:  None. FINDINGS: There is no evidence of fracture or other focal bone lesions. Soft tissues are unremarkable. IMPRESSION: Negative. Electronically Signed   By: Nelson Chimes M.D.   On: 05/18/2019 21:41     Time Spent in minutes  30     Desiree Hane M.D on 05/22/2019 at 3:32 PM  To page go to [email or web address] - password [email or web address]

## 2019-05-23 ENCOUNTER — Inpatient Hospital Stay (HOSPITAL_COMMUNITY): Payer: Medicaid Other

## 2019-05-23 DIAGNOSIS — K5900 Constipation, unspecified: Secondary | ICD-10-CM

## 2019-05-23 DIAGNOSIS — R109 Unspecified abdominal pain: Secondary | ICD-10-CM | POA: Clinically undetermined

## 2019-05-23 DIAGNOSIS — R1032 Left lower quadrant pain: Secondary | ICD-10-CM

## 2019-05-23 DIAGNOSIS — E11649 Type 2 diabetes mellitus with hypoglycemia without coma: Secondary | ICD-10-CM

## 2019-05-23 LAB — COMPREHENSIVE METABOLIC PANEL
ALT: 11 U/L (ref 0–44)
AST: 17 U/L (ref 15–41)
Albumin: 2.4 g/dL — ABNORMAL LOW (ref 3.5–5.0)
Alkaline Phosphatase: 81 U/L (ref 38–126)
Anion gap: 11 (ref 5–15)
BUN: 22 mg/dL (ref 8–23)
CO2: 25 mmol/L (ref 22–32)
Calcium: 8.6 mg/dL — ABNORMAL LOW (ref 8.9–10.3)
Chloride: 96 mmol/L — ABNORMAL LOW (ref 98–111)
Creatinine, Ser: 0.54 mg/dL (ref 0.44–1.00)
GFR calc Af Amer: >60 mL/min (ref >60)
GFR calc non Af Amer: >60 mL/min (ref >60)
Glucose, Bld: 62 mg/dL — ABNORMAL LOW (ref 70–99)
Potassium: 3.8 mmol/L (ref 3.5–5.1)
Sodium: 132 mmol/L — ABNORMAL LOW (ref 135–145)
Total Bilirubin: 0.9 mg/dL (ref 0.3–1.2)
Total Protein: 6.3 g/dL — ABNORMAL LOW (ref 6.5–8.1)

## 2019-05-23 LAB — SEDIMENTATION RATE: Sed Rate: 55 mm/hr — ABNORMAL HIGH (ref 0–22)

## 2019-05-23 LAB — CBC
HCT: 32 % — ABNORMAL LOW (ref 36.0–46.0)
Hemoglobin: 10.7 g/dL — ABNORMAL LOW (ref 12.0–15.0)
MCH: 29.4 pg (ref 26.0–34.0)
MCHC: 33.4 g/dL (ref 30.0–36.0)
MCV: 87.9 fL (ref 80.0–100.0)
Platelets: 370 10*3/uL (ref 150–400)
RBC: 3.64 MIL/uL — ABNORMAL LOW (ref 3.87–5.11)
RDW: 13.8 % (ref 11.5–15.5)
WBC: 6.6 10*3/uL (ref 4.0–10.5)
nRBC: 0 % (ref 0.0–0.2)

## 2019-05-23 LAB — GLUCOSE, CAPILLARY
Glucose-Capillary: 59 mg/dL — ABNORMAL LOW (ref 70–99)
Glucose-Capillary: 88 mg/dL (ref 70–99)
Glucose-Capillary: 108 mg/dL — ABNORMAL HIGH (ref 70–99)
Glucose-Capillary: 85 mg/dL (ref 70–99)
Glucose-Capillary: 76 mg/dL (ref 70–99)

## 2019-05-23 LAB — MAGNESIUM: Magnesium: 2.1 mg/dL (ref 1.7–2.4)

## 2019-05-23 LAB — CK: Total CK: 14 U/L — ABNORMAL LOW (ref 38–234)

## 2019-05-23 LAB — PHOSPHORUS: Phosphorus: 3.6 mg/dL (ref 2.5–4.6)

## 2019-05-23 LAB — FERRITIN: Ferritin: 74 ng/mL (ref 11–307)

## 2019-05-23 LAB — C-REACTIVE PROTEIN: CRP: 6.8 mg/dL — ABNORMAL HIGH (ref <1.0)

## 2019-05-23 MED ORDER — INSULIN ASPART 100 UNIT/ML ~~LOC~~ SOLN: 0.0000 [IU] | Freq: Three times a day (TID) | SUBCUTANEOUS | Status: DC

## 2019-05-23 MED ORDER — INSULIN GLARGINE 100 UNIT/ML ~~LOC~~ SOLN
10.0000 [IU] | Freq: Every day | SUBCUTANEOUS | Status: DC
  Administered 2019-05-23: 10 [IU] via SUBCUTANEOUS
  Filled 2019-05-23: qty 0.1

## 2019-05-23 NOTE — Progress Notes (Signed)
[facility]  PROGRESS NOTE  Emily Hoover TGY:[id number] DOB: 1946-06-25 DOA: 05/18/2019 PCP: Charlott Rakes, MD Admit date - 05/18/2019   Admitting Physician Norval Morton, MD  Outpatient Primary MD for the patient is Charlott Rakes, MD  LOS - 3 Brief Narrative   Emily Hoover is a 73 y.o. year old female with medical history significant for HTN, DM, left hip infectious myositis and MRSA bacteremia in 2017 who presented on 05/18/2019 with left sided pain after fall 2 days prior to admission where she landed on her rib regio and was found to have multiple left sided rib fractures.   on admission blood glucose of greater than 400 without anion gap and pseudohyponatremia 133.   Hospital course complicated by persistent pain from rib cage fractures requiring IV pain control, COVID infection without symptoms, hypertensive urgency  Subjective  Emily Hoover today still having left sided pain. Points to left abdomen as well as hip and rib cage area. No bowel movement still A & P    1. Multiple left-sided low rib fractures, persistent pain with minimal movement. Still localizing pain to rib cage. Required 6 mg of morphine in last 24 hours. Awaiting further imaging addressed below. Continue oral regimen as well. Unable to even situp at bedside and work with physical therapy (as needed tramadol 50-100 mg every 6 hours p.o. as needed severe pain, scheduled Robaxin 1 mg 4 times daily, scheduled Tylenol 650 mg every 6 hours) IV morphine 1 mg q 3 hrs severe pain PRN. Encouraged incentive spirometry. Cleared by Trauma surgery  2. Severe left hip pain in patient with history of infectious myositis along the adductor and iliopsoas muscle.  Persistent pain localized to hip and distal leg despite IV pain control. Remains afebrile Waiting for MRI pelvis/hip for better evaluation for pathology of bone and MSK given unremarkable CT pelvis. Check CK, ESR. CRP 6.8, up from 3.8  3. Unwitnessed fall.  History  limited due to language barrier despite using interpreter.  Has history of vertigo/syncope. Unable to check orthostatics as patient unable to bear weight due to #1 and #2  4. COVID-19 infection, asymptomatic.  Normal oxygen saturation on room air, continue vitamin C and zinc, closely monitor.  5. Large stool burden on CT abd imaging. Having pain in left abdomen, non-acute abdominal exam. Likely related to opioids. Continue bowel regimen--  6. Type 2 diabetes, poorly controlled with intermittent hypoglycemic episodes, A1c 11.7, Glucose again less than 60, will decrease Lantus again to 10 U. Holding home metformin, D monitor CBGs   7. Hypertension, stable at goal.  Continue home lisinopril and amlodipine  8. Hyperlipidemia, stable.  Continue home atorvastatin     Family Communication  :  No family at bedside  Code Status :  FULL Code  Disposition Plan  : Rule out hip fracture or MSK etiology of persistent/intractable left hip/leg painwith MRIContinue IV pain control, PT recommends SNF given limited mobility and family unable to provide 24 h assistance to return ateint to prior level of function  Consults  : Trauma surgery  Procedures  : None  DVT Prophylaxis  :  Lovenox   Lab Results  Component Value Date   PLT 370 05/23/2019    Diet :  Diet Order            Diet Carb Modified Fluid consistency: Thin; Room service appropriate? Yes  Diet effective now               Inpatient Medications Scheduled  Meds:  acetaminophen  650 mg Oral Q6H   amLODipine  10 mg Oral Daily   atorvastatin  20 mg Oral Daily   enoxaparin (LOVENOX) injection  40 mg Subcutaneous Daily   insulin aspart  0-9 Units Subcutaneous TID WC   insulin glargine  10 Units Subcutaneous QHS   lisinopril  5 mg Oral Daily   methocarbamol  1,000 mg Oral QID   polyethylene glycol  17 g Oral BID   senna-docusate  2 tablet Oral BID   sodium chloride flush  3 mL Intravenous Q12H   vitamin C  500 mg Oral  Daily   zinc sulfate  220 mg Oral Daily   Continuous Infusions: PRN Meds:.albuterol, guaiFENesin-dextromethorphan, labetalol, morphine injection, ondansetron **OR** ondansetron (ZOFRAN) IV, traMADol  Antibiotics  :   Anti-infectives (From admission, onward)   None       Objective   Vitals:   05/22/19 0741 05/22/19 1700 05/22/19 2345 05/23/19 0730  BP: 130/71 132/70 (!) 145/78 (!) 151/80  Pulse: 89  87 86  Resp:   20   Temp: 98 F (36.7 C) 97.9 F (36.6 C) 97.9 F (36.6 C)   TempSrc:  Oral Oral   SpO2: 100% 100% 97% 97%  Weight:        SpO2: 97 % O2 Flow Rate (L/min): 98 L/min  Wt Readings from Last 3 Encounters:  05/20/19 43 kg  10/06/18 45.4 kg  07/07/18 45.6 kg    No intake or output data in the 24 hours ending 05/23/19 1036  Physical Exam:  Sitting in bed, pain persists on left side (including rib cage, left hip, left buttocks) Pain reproducible with palpation of left ribs, no bruising noted Reproducible pain with palpation of left hip No new F.N deficits, follows commands Symmetrical Chest wall movement, Good air movement bilaterally on room air, CTAB Soft abdomen, normal bowel sounds, tenderness in left lower quadrant without rebound tenderness or guarding.     I have personally reviewed the following:   Data Reviewed:  CBC Recent Labs  Lab 05/19/19 0816 05/20/19 0413 05/22/19 1619 05/23/19 0437  WBC 7.1 5.3 8.4 6.6  HGB 12.5 9.8* 10.4* 10.7*  HCT 37.2 29.0* 31.1* 32.0*  PLT 389 348 336 370  MCV 88.8 89.0 88.6 87.9  MCH 29.8 30.1 29.6 29.4  MCHC 33.6 33.8 33.4 33.4  RDW 13.8 14.0 13.8 13.8  LYMPHSABS 1.0  --   --   --   MONOABS 0.4  --   --   --   EOSABS 0.0  --   --   --   BASOSABS 0.1  --   --   --     Chemistries  Recent Labs  Lab 05/19/19 0816 05/20/19 0413 05/21/19 0431 05/22/19 0406 05/23/19 0437  NA 133* 137 135 134* 132*  K 3.7 3.6 3.4* 3.7 3.8  CL 95* 103 101 98 96*  CO2 25 25 25 27 25   GLUCOSE 413* 138* 79 63* 62*   BUN 21 21 21 23 22   CREATININE 0.78 1.08* 0.85 0.57 0.54  CALCIUM 9.9 8.4* 8.5* 8.8* 8.6*  MG  --  1.8 1.9 2.0 2.1  AST 16 11* 13* 16 17  ALT 15 12 10 12 11   ALKPHOS 107 73 69 79 81  BILITOT 1.2 0.8 0.9 0.8 0.9   ------------------------------------------------------------------------------------------------------------------ No results for input(s): CHOL, HDL, LDLCALC, TRIG, CHOLHDL, LDLDIRECT in the last 72 hours.  Lab Results  Component Value Date   HGBA1C 11.7 (H) 05/20/2019   ------------------------------------------------------------------------------------------------------------------  No results for input(s): TSH, T4TOTAL, T3FREE, THYROIDAB in the last 72 hours.  Invalid input(s): FREET3 ------------------------------------------------------------------------------------------------------------------ Recent Labs    05/22/19 0406 05/23/19 0437  FERRITIN 65 74    Coagulation profile No results for input(s): INR, PROTIME in the last 168 hours.  No results for input(s): DDIMER in the last 72 hours.  Cardiac Enzymes No results for input(s): CKMB, TROPONINI, MYOGLOBIN in the last 168 hours.  Invalid input(s): CK ------------------------------------------------------------------------------------------------------------------ No results found for: BNP  Micro Results Recent Results (from the past 240 hour(s))  SARS CORONAVIRUS 2 (TAT 6-24 HRS) Nasopharyngeal Nasopharyngeal Swab     Status: Abnormal   Collection Time: 05/19/19 11:50 AM   Specimen: Nasopharyngeal Swab  Result Value Ref Range Status   SARS Coronavirus 2 POSITIVE (A) NEGATIVE Final    Comment: RESULT CALLED TO, READ BACK BY AND VERIFIED WITH: C BAIN,RN 1819 05/19/2019 D BRADLEY (NOTE) SARS-CoV-2 target nucleic acids are DETECTED. The SARS-CoV-2 RNA is generally detectable in upper and lower respiratory specimens during the acute phase of infection. Positive results are indicative of active  infection with SARS-CoV-2. Clinical  correlation with patient history and other diagnostic information is necessary to determine patient infection status. Positive results do  not rule out bacterial infection or co-infection with other viruses. The expected result is Negative. Fact Sheet for Patients: SugarRoll.be Fact Sheet for Healthcare Providers: https://www.woods-mathews.com/ This test is not yet approved or cleared by the [place] FDA and  has been authorized for detection and/or diagnosis of SARS-CoV-2 by FDA under an Emergency Use Authorization (EUA). This EUA will remain  in effect (meaning this test can be used) for t he duration of the COVID-19 declaration under Section 564(b)(1) of the Act, 21 U.S.C. section 360bbb-3(b)(1), unless the authorization is terminated or revoked sooner. Performed at [facility], [place] [address], [place], [place] [address]     Radiology Reports Dg Ribs Unilateral W/chest Left  Result Date: 05/18/2019 CLINICAL DATA:  [place] yesterday.  Left-sided pain. EXAM: LEFT RIBS AND CHEST - 3+ VIEW COMPARISON:  10/06/2018 FINDINGS: Multiple lower left rib fractures. Fracture of the lateral ribs number 5, 6 and 7. Fracture of the posterior ribs numbers 10 and 11. No evidence of pneumothorax or hemothorax. IMPRESSION: Multiple left rib fractures. Lateral fractures numbers 5 through 7. Posterior fractures numbers 10 and 11. Electronically Signed   By: Nelson Chimes M.D.   On: 05/18/2019 21:48   Dg Pelvis 1-2 Views  Result Date: 05/18/2019 CLINICAL DATA:  [place] yesterday. Pain. EXAM: PELVIS - 1-2 VIEW COMPARISON:  None. FINDINGS: Question inferior ramus fracture on the left. No other pelvic ring disruption identified or suspected. IMPRESSION: Suspicion of inferior ramus fracture on the left. Electronically Signed   By: Nelson Chimes M.D.   On: 05/18/2019 21:44   Ct Head Wo Contrast  Result Date: 05/19/2019 CLINICAL  DATA:  Pain after fall EXAM: CT HEAD WITHOUT CONTRAST TECHNIQUE: Contiguous axial images were obtained from the base of the skull through the vertex without intravenous contrast. COMPARISON:  01/19/2011 FINDINGS: Brain: No evidence of acute infarction, hemorrhage, hydrocephalus, extra-axial collection or mass lesion/mass effect. Vascular: Mild atherosclerotic calcifications involving the large vessels of the skull base. No unexpected hyperdense vessel. Skull: Normal. Negative for fracture or focal lesion. Sinuses/Orbits: Unchanged left ethmoid osteoma. Paranasal sinuses and mastoid air cells are clear. Orbital structures unremarkable. Other: None. IMPRESSION: No acute intracranial abnormality. Electronically Signed   By: Davina Poke M.D.   On: 05/19/2019 09:04  Ct Chest W Contrast  Result Date: 05/19/2019 CLINICAL DATA:  [place] yesterday. Left-sided abdominal and flank pain. EXAM: CT CHEST, ABDOMEN, AND PELVIS WITH CONTRAST TECHNIQUE: Multidetector CT imaging of the chest, abdomen and pelvis was performed following the standard protocol during bolus administration of intravenous contrast. CONTRAST:  133m OMNIPAQUE IOHEXOL 300 MG/ML  SOLN COMPARISON:  CT scan 09/16/2015 FINDINGS: CT CHEST FINDINGS Cardiovascular: The heart is normal in size. No pericardial effusion. The aorta is normal in caliber. There is moderate tortuosity and atherosclerotic calcification. No dissection. The branch vessels are patent. Scattered atherosclerotic calcifications. Scattered coronary artery calcifications are also noted. The pulmonary arteries appear normal. Mediastinum/Nodes: No mediastinal or hilar mass or adenopathy or hematoma. Lungs/Pleura: No acute pulmonary findings. No pulmonary contusion, pleural effusion or pneumothorax. Dependent bibasilar atelectasis is noted. There are underlying emphysematous changes and pulmonary scarring. No worrisome pulmonary lesions. Musculoskeletal: No breast masses, supraclavicular or  axillary adenopathy. The bony thorax is intact. No definite sternal fractures. The thoracic vertebral bodies are normally aligned. No acute fracture. There are left lower rib fractures. There is a nondisplaced eighth lateral rib fracture and nondisplaced eleventh and twelfth posterior rib fractures. CT ABDOMEN PELVIS FINDINGS Hepatobiliary: No focal hepatic lesions or acute hepatic injury. No perihepatic fluid collections. The portal and hepatic veins are patent. The gallbladder is grossly normal. No common bile duct dilatation. Pancreas: No mass, inflammation or ductal dilatation. Moderate atrophy of the pancreatic body and tail regions. No acute pancreatic injury or peripancreatic fluid collection. Spleen: Normal size.  No focal lesions. Adrenals/Urinary Tract: The adrenal glands and kidneys are unremarkable. Small scattered renal cysts are noted. Mild renal cortical scarring changes. No collecting system abnormalities. The bladder appears normal. Stomach/Bowel: The stomach, duodenum and small bowel are unremarkable. No acute inflammatory changes, mass lesions or obstructive findings. There is a large amount of stool throughout the colon and down into the rectum suggesting constipation. No inflammatory changes or mass lesions. Vascular/Lymphatic: Moderate tortuosity and calcification of the abdominal aorta and iliac arteries but no aneurysm or dissection. The branch vessels are patent. The major venous structures are patent. No mesenteric or retroperitoneal mass or adenopathy. No hematoma. Reproductive: The uterus and ovaries are unremarkable. Other: No free pelvic fluid collections or pelvic hematoma. Musculoskeletal: The bony structures are intact. No acute fractures are identified. The hips are normally located. The pubic symphysis and SI joints are intact. 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 Cardiovascular: The heart is normal in size. IMPRESSION: 1. Left lower rib fractures likely accounting for the patient's left flank and side pain. No significant displacement and no associated pneumothorax or pleural hematoma. 2. Dependent bibasilar atelectasis but no worrisome pulmonary findings. 3. Normal appearance of the heart and great vessels. No mediastinal hematoma. 4. No acute intra-abdominal/intrapelvic abnormality. No free air or free fluid and the solid abdominal organs are intact. 5. Large amount of stool throughout the colon suggesting constipation. 6. Intact bony pelvis. Electronically Signed   By: PMarijo SanesM.D.   On: 05/19/2019 13:25   Ct Pelvis Wo Contrast  Result Date: 05/21/2019 CLINICAL DATA:  Possible left inferior pubic ramus fracture seen on pelvis x-ray, not confirmed on the CT the abdomen and pelvis from May 19, 2019. Pain. EXAM: CT PELVIS WITHOUT CONTRAST TECHNIQUE: Multidetector CT imaging of the pelvis was performed following the standard protocol without intravenous contrast. COMPARISON:  None.  FINDINGS: Urinary Tract: The bladder is filled with contrast but otherwise normal. Bowel: Fecal loading seen in the colon. Visualized appendix is normal. Visualized loops of small bowel are normal. Vascular/Lymphatic: Atherosclerotic changes are seen in the aorta and iliac vessels. No adenopathy. Reproductive:  No mass or other significant abnormality Other:  None. Musculoskeletal: The iliac bones are normal. Specifically, the inferior left pubic ramus is normal with no acute fracture. Degenerative changes are seen in the pubic symphysis. The proximal femurs are intact. The acetabular intact. The SI joints and sacrum are intact. Bilateral L5 pars defects are noted with grade 1 anterolisthesis of L5 versus S1. IMPRESSION: 1. Bilateral L5 pars defects are noted with grade 1 anterolisthesis of L5 versus S1. No other fractures noted. 2. Moderate fecal loading  throughout the visualized colon. 3. Atherosclerotic changes in the aorta and iliac vessels. Electronically Signed   By: Dorise Bullion III M.D   On: 05/21/2019 20:44   Ct Abdomen Pelvis W Contrast  Result Date: 05/19/2019 CLINICAL DATA:  [place] yesterday. Left-sided abdominal and flank pain. EXAM: CT CHEST, ABDOMEN, AND PELVIS WITH CONTRAST TECHNIQUE: Multidetector CT imaging of the chest, abdomen and pelvis was performed following the standard protocol during bolus administration of intravenous contrast. CONTRAST:  162m OMNIPAQUE IOHEXOL 300 MG/ML  SOLN COMPARISON:  CT scan 09/16/2015 FINDINGS: CT CHEST FINDINGS Cardiovascular: The heart is normal in size. No pericardial effusion. The aorta is normal in caliber. There is moderate tortuosity and atherosclerotic calcification. No dissection. The branch vessels are patent. Scattered atherosclerotic calcifications. Scattered coronary artery calcifications are also noted. The pulmonary arteries appear normal. Mediastinum/Nodes: No mediastinal or hilar mass or adenopathy or hematoma. Lungs/Pleura: No acute pulmonary findings. No pulmonary contusion, pleural effusion or pneumothorax. Dependent bibasilar atelectasis is noted. There are underlying emphysematous changes and pulmonary scarring. No worrisome pulmonary lesions. Musculoskeletal: No breast masses, supraclavicular or axillary adenopathy. The bony thorax is intact. No definite sternal fractures. The thoracic vertebral bodies are normally aligned. No acute fracture. There are left lower rib fractures. There is a nondisplaced eighth lateral rib fracture and nondisplaced eleventh and twelfth posterior rib fractures. CT ABDOMEN PELVIS FINDINGS Hepatobiliary: No focal hepatic lesions or acute hepatic injury. No perihepatic fluid collections. The portal and hepatic veins are patent. The gallbladder is grossly normal. No common bile duct dilatation. Pancreas: No mass, inflammation or ductal dilatation. Moderate  atrophy of the pancreatic body and tail regions. No acute pancreatic injury or peripancreatic fluid collection. Spleen: Normal size.  No focal lesions. Adrenals/Urinary Tract: The adrenal glands and kidneys are unremarkable. Small scattered renal cysts are noted. Mild renal cortical scarring changes. No collecting system abnormalities. The bladder appears normal. Stomach/Bowel: The stomach, duodenum and small bowel are unremarkable. No acute inflammatory changes, mass lesions or obstructive findings. There is a large amount of stool throughout the colon and down into the rectum suggesting constipation. No inflammatory changes or mass lesions. Vascular/Lymphatic: Moderate tortuosity and calcification of the abdominal aorta and iliac arteries but no aneurysm or dissection. The branch vessels are patent. The major venous structures are patent. No mesenteric or retroperitoneal mass or adenopathy. No hematoma. Reproductive: The uterus and ovaries are unremarkable. Other: No free pelvic fluid collections or pelvic hematoma. Musculoskeletal: The bony structures are intact. No acute fractures are identified. The hips are normally located. The pubic symphysis and SI joints are intact. 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 Advanced degenerate disc disease at  L4-5 and L5-S1. Cardiovascular: The heart is normal in size. IMPRESSION: 1. Left lower rib fractures likely accounting for the patient's left flank and side pain. No significant displacement and no associated pneumothorax or pleural hematoma. 2. Dependent bibasilar atelectasis but no worrisome pulmonary findings. 3. Normal appearance of the heart and great vessels. No mediastinal hematoma. 4. No acute intra-abdominal/intrapelvic abnormality. No free air or free fluid and the solid abdominal organs are intact. 5. Large amount of stool throughout the colon suggesting constipation. 6.  Intact bony pelvis. Electronically Signed   By: Marijo Sanes M.D.   On: 05/19/2019 13:25   Dg Knee Complete 4 Views Left  Result Date: 05/18/2019 CLINICAL DATA:  [place] yesterday. Knee pain. EXAM: LEFT KNEE - COMPLETE 4+ VIEW COMPARISON:  None. FINDINGS: No evidence of fracture or joint effusion. 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 IMPRESSION: No acute or traumatic finding. 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 Electronically Signed   By: Nelson Chimes M.D.   On: 05/18/2019 21:40   Dg Femur Min 2 Views Left  Result Date: 05/18/2019 CLINICAL DATA:  [place] yesterday.  Pain. EXAM: LEFT FEMUR 2 VIEWS COMPARISON:  None. FINDINGS: There is no evidence of fracture or other focal bone lesions. Soft tissues are unremarkable. IMPRESSION: Negative. Electronically Signed   By: Nelson Chimes M.D.   On: 05/18/2019 21:41     Time Spent in minutes  30     Desiree Hane M.D on 05/23/2019 at 10:36 AM  To page go to [email or web address] - password [email or web address]

## 2019-05-23 NOTE — Progress Notes (Signed)
PT Cancellation Note  Patient Details Name: Keaundra Eatherton MRN: [id number] DOB: 08/12/1945   Cancelled Treatment:    Reason Eval/Treat Not Completed: Patient at procedure or test/unavailable;Medical issues which prohibited therapy;Other (comment)(pt going to MRI imminently due to increases in pain.)  Will see pt as able 11/15. 05/23/2019  Donnella Sham, [place] [phone]  (pager) [phone]  (office)   Tessie Fass Ouida Abeyta 05/23/2019, 5:54 PM

## 2019-05-23 NOTE — Progress Notes (Signed)
Attempted to scan patient. I was able to get through one scan and then she started moving and knocking on the bore. She had agreed to continue scan but then she stared raising her hand and knocking on the door once again. She complained about nausea and pain and wanted to go back to room. Nurse aware.

## 2019-05-24 LAB — GLUCOSE, CAPILLARY
Glucose-Capillary: 62 mg/dL — ABNORMAL LOW (ref 70–99)
Glucose-Capillary: 86 mg/dL (ref 70–99)
Glucose-Capillary: 88 mg/dL (ref 70–99)
Glucose-Capillary: 91 mg/dL (ref 70–99)
Glucose-Capillary: 114 mg/dL — ABNORMAL HIGH (ref 70–99)

## 2019-05-24 MED ORDER — ACETAMINOPHEN 325 MG PO TABS
650.0000 mg | ORAL_TABLET | Freq: Three times a day (TID) | ORAL | Status: DC
  Administered 2019-05-24 – 2019-05-30 (×19): 650 mg via ORAL
  Filled 2019-05-24 (×19): qty 2

## 2019-05-24 MED ORDER — METHOCARBAMOL 500 MG PO TABS
1000.0000 mg | ORAL_TABLET | Freq: Three times a day (TID) | ORAL | Status: DC
  Administered 2019-05-24 – 2019-05-25 (×3): 1000 mg via ORAL
  Filled 2019-05-24 (×3): qty 2

## 2019-05-24 MED ORDER — TRAMADOL HCL 50 MG PO TABS
50.0000 mg | ORAL_TABLET | Freq: Three times a day (TID) | ORAL | Status: DC
  Administered 2019-05-24 – 2019-05-28 (×14): 50 mg via ORAL
  Filled 2019-05-24 (×14): qty 1

## 2019-05-24 MED ORDER — TRAMADOL HCL 50 MG PO TABS
50.0000 mg | ORAL_TABLET | Freq: Once | ORAL | Status: DC
  Filled 2019-05-24 (×2): qty 1

## 2019-05-24 MED ORDER — INSULIN ASPART 100 UNIT/ML ~~LOC~~ SOLN
0.0000 [IU] | Freq: Three times a day (TID) | SUBCUTANEOUS | Status: DC
  Administered 2019-05-25 (×2): 1 [IU] via SUBCUTANEOUS
  Administered 2019-05-26: 12:00:00 3 [IU] via SUBCUTANEOUS
  Administered 2019-05-26: 09:00:00 1 [IU] via SUBCUTANEOUS
  Administered 2019-05-27: 16:00:00 2 [IU] via SUBCUTANEOUS
  Administered 2019-05-27: 12:00:00 3 [IU] via SUBCUTANEOUS
  Administered 2019-05-28: 2 [IU] via SUBCUTANEOUS
  Administered 2019-05-28: 5 [IU] via SUBCUTANEOUS
  Administered 2019-05-29 – 2019-05-30 (×2): 2 [IU] via SUBCUTANEOUS

## 2019-05-24 NOTE — Progress Notes (Signed)
Nursing rounds completed. Patient found at edge of bed curled up vomiting into linens. Assisted patient back to bed. Clean gown provided. Spanish interpreter Verdis Frederickson) assist the Probation officer of this noted with introduction and explaining plan of care. The writer of this note introduces self and asks about pain location and level. Patient states that she has pain to left side, but unable to rate pain at this time. Bed alarm engaged. Demonstrated use of call light with return demonstration. Opportunity for patient to ask questions and voice concerns. Patient continues to ask about pain medication. Staff nurse explains that patient will get pain medication and nausea medication. Verbalized understanding.

## 2019-05-24 NOTE — Progress Notes (Signed)
PROGRESS NOTE  Emily Hoover KGY:[id number] DOB: 26-Aug-1945 DOA: 05/18/2019  PCP: Emily Rakes, MD  Brief History/Interval Summary:  Emily Hoover is a 73 y.o. year old female with medical history significant for HTN, DM, left hip infectious myositis and MRSA bacteremia in 2017 who presented on 05/18/2019 with left sided pain after fall 2 days prior to admission where she landed on her rib regio and was found to have multiple left sided rib fractures.   on admission blood glucose of greater than 400 without anion gap and pseudohyponatremia 133. Hospital course complicated by persistent pain from rib cage fractures requiring IV pain control, COVID infection without symptoms, hypertensive urgency   Reason for Visit: Left sided rib fractures.  Poorly controlled pain.  COVID-19 infection  Consultants: None  Procedures: None  Antibiotics: Anti-infectives (From admission, onward)   None      Subjective/Interval History: Interpreter services were utilized to talk to the patient.  Patient mentions that her pain is mainly in her left ribs from a fall she sustained a little while ago.  She does have pain in both her hips left more than right which has been ongoing for many months.  She is able to move both of her lower extremities without any difficulty this morning.  Despite using interpreter services, history is limited.    Assessment/Plan:  Multiple left-sided rib fractures with persistent pain with minimal movement Patient is requiring parenteral narcotics to control her pain.  She is on parenteral morphine.  She is on tramadol.  We will place her on scheduled tramadol for now.  Also on scheduled acetaminophen.  Noted to be on scheduled Robaxin as well.  PT and OT to continue to mobilize the patient.  Incentive spirometry.  She was seen by trauma surgery and cleared by them.  Bilateral hip pain left more than right Despite using interpreter it was very difficult to obtain history from  her.  After multiple lines of questioning I was able to determine that she has had this hip pain for a few months now.  She usually does not require any assistance at home with walking.  She had a CT scan of abdomen pelvis which did not reveal any fractures in her joints.  3 years ago in the setting of MRSA bacteremia she was found to have myositis involving the adductor and iliopsoas muscles.  Because of this MRI was ordered.  It looks like they tried to do the MRI yesterday evening but patient was not able to cooperate.  Today she does have good range of motion of both her hips.  Her pain is mainly in her rib cage on the left side.  Her CK level is normal.  ESR and CRP noted to be elevated which could be from Covid.  With a normal CK level the chances of significant myositis is low.  No fractures identified on imaging studies.  Continue with PT and OT.  Will hold off on MRI for now.  Unwitnessed fall It remains unclear if she had any kind of syncopal episode.  History has been limited.  PT and OT evaluation.  She will need skilled nursing facility for rehab.  COVID-19 infection She remains asymptomatic.  Saturating normal on room air.  She is afebrile.  Continue vitamin C and zinc.  Constipation Significant stool burden noted on imaging studies.  She is on bowel regimen.  TSH was normal last year.  We will recheck.  Diabetes mellitus type 2, uncontrolled with intermittent hypoglycemic episodes  HbA1c 11.7.  She has had low glucose levels here in the hospital.  We will stop Lantus for now.  We are holding her metformin.  Monitor CBGs.  Essential hypertension Continue lisinopril and amlodipine.  Monitor blood pressures closely.  History of hyperlipidemia Continue statin.  Normocytic anemia Hemoglobin is stable.  No evidence of overt loss.  Hyponatremia Recheck labs tomorrow.   DVT Prophylaxis: Lovenox Code Status: Full code Family Communication: We will attempt to call her family members  Disposition Plan: She will need skilled nursing facility for rehab when ready for discharge.   Medications:  Scheduled: . acetaminophen  650 mg Oral Q6H  . amLODipine  10 mg Oral Daily  . atorvastatin  20 mg Oral Daily  . enoxaparin (LOVENOX) injection  40 mg Subcutaneous Daily  . insulin aspart  0-9 Units Subcutaneous TID AC  . lisinopril  5 mg Oral Daily  . methocarbamol  1,000 mg Oral QID  . polyethylene glycol  17 g Oral BID  . senna-docusate  2 tablet Oral BID  . sodium chloride flush  3 mL Intravenous Q12H  . vitamin C  500 mg Oral Daily  . zinc sulfate  220 mg Oral Daily   Continuous:  VOH:YWVPXTGGY, guaiFENesin-dextromethorphan, labetalol, morphine injection, ondansetron **OR** ondansetron (ZOFRAN) IV, traMADol   Objective:  Vital Signs  Vitals:   05/23/19 2355 05/24/19 0355 05/24/19 0700 05/24/19 0758  BP: (!) 141/77 (!) 159/70  (!) 144/79  Pulse: 83 71 74 82  Resp: _0 Temp: 98.2 F (36.8 C) (!) 97.5 F (36.4 C)  98.2 F (36.8 C)  TempSrc: Oral Oral  Oral  SpO2: 97% 97%  95%  Weight:        Intake/Output Summary (Last 24 hours) at 05/24/2019 1012 Last data filed at 05/23/2019 1300 Gross per 24 hour  Intake -  Output 450 ml  Net -450 ml   Filed Weights   05/20/19 0117 05/23/19 2149  Weight: 43 kg 42.8 kg    General appearance: Awake alert.  In no distress Resp: Clear to auscultation bilaterally.  Normal effort Chest: No bruising noted over the left chest area but it is tender to palpation. Cardio: S1-S2 is normal regular.  No S3-S4.  No rubs murmurs or bruit GI: Abdomen is soft.  Nontender nondistended.  Bowel sounds are present normal.  No masses organomegaly Extremities: No lower extremity edema.  She was able to lift both of her lower extremities off the bed without any difficulty.  The hip joint was manipulated on both sides without any significant tenderness.  She did have some tenderness in the rib cage area when I was manipulating.   Neurologic: No focal neurological deficits.   Lab Results:  Data Reviewed: I have personally reviewed following labs and imaging studies  CBC: Recent Labs  Lab 05/19/19 0816 05/20/19 0413 05/22/19 1619 05/23/19 0437  WBC 7.1 5.3 8.4 6.6  NEUTROABS 5.6  --   --   --   HGB 12.5 9.8* 10.4* 10.7*  HCT 37.2 29.0* 31.1* 32.0*  MCV 88.8 89.0 88.6 87.9  PLT 389 348 336 694    Basic Metabolic Panel: Recent Labs  Lab 05/19/19 0816 05/20/19 0413 05/21/19 0431 05/22/19 0406 05/23/19 0437  NA 133* 137 135 134* 132*  K 3.7 3.6 3.4* 3.7 3.8  CL 95* 103 101 98 96*  CO2 _1 GLUCOSE 413* 138* 79 63* 62*  BUN _2 22  CREATININE 0.78 1.08* 0.85 0.57 0.54  CALCIUM 9.9 8.4* 8.5* 8.8* 8.6*  MG  --  1.8 1.9 2.0 2.1  PHOS  --  3.1 3.7 4.0 3.6    GFR: CrCl cannot be calculated (Unknown ideal weight.).  Liver Function Tests: Recent Labs  Lab 05/19/19 0816 05/20/19 0413 05/21/19 0431 05/22/19 0406 05/23/19 0437  AST 16 11* 13* 16 17  ALT _0 ALKPHOS 107 73 69 79 81  BILITOT 1.2 0.8 0.9 0.8 0.9  PROT 7.8 5.9* 6.0* 6.3* 6.3*  ALBUMIN 3.3* 2.4* 2.4* 2.5* 2.4*    Cardiac Enzymes: Recent Labs  Lab 05/23/19 0437  CKTOTAL 14*     CBG: Recent Labs  Lab 05/23/19 1126 05/23/19 1608 05/23/19 2039 05/24/19 0725 05/24/19 0747  GLUCAP 108* 85 76 62* 86    Anemia Panel: Recent Labs    05/22/19 0406 05/23/19 0437  FERRITIN 65 74    Recent Results (from the past 240 hour(s))  SARS CORONAVIRUS 2 (TAT 6-24 HRS) Nasopharyngeal Nasopharyngeal Swab     Status: Abnormal   Collection Time: 05/19/19 11:50 AM   Specimen: Nasopharyngeal Swab  Result Value Ref Range Status   SARS Coronavirus 2 POSITIVE (A) NEGATIVE Final    Comment: RESULT CALLED TO, READ BACK BY AND VERIFIED WITH: C BAIN,RN 1819 05/19/2019 D BRADLEY (NOTE) SARS-CoV-2 target nucleic acids are DETECTED. The SARS-CoV-2 RNA is generally detectable in upper and lower respiratory  specimens during the acute phase of infection. Positive results are indicative of active infection with SARS-CoV-2. Clinical  correlation with patient history and other diagnostic information is necessary to determine patient infection status. Positive results do  not rule out bacterial infection or co-infection with other viruses. The expected result is Negative. Fact Sheet for Patients: SugarRoll.be Fact Sheet for Healthcare Providers: https://www.woods-mathews.com/ This test is not yet approved or cleared by the [place] FDA and  has been authorized for detection and/or diagnosis of SARS-CoV-2 by FDA under an Emergency Use Authorization (EUA). This EUA will remain  in effect (meaning this test can be used) for t he duration of the COVID-19 declaration under Section 564(b)(1) of the Act, 21 U.S.C. section 360bbb-3(b)(1), unless the authorization is terminated or revoked sooner. Performed at [facility], [place] [address] [place], [place] [address]       Radiology Studies: No results found.     LOS: 4 days   Vendela Troung [employer] on [email or web address]  05/24/2019, 10:12 AM

## 2019-05-24 NOTE — Progress Notes (Signed)
Pt got very upset and kept asking for her papers.  We had translation services helping out. She believes that it is 1999 and that we took her identification and through it away.  Spoke with son and he states her ID is at home. Having him try to speak to her to calm her down.

## 2019-05-25 DIAGNOSIS — I10 Essential (primary) hypertension: Secondary | ICD-10-CM

## 2019-05-25 LAB — C-REACTIVE PROTEIN: CRP: 2.2 mg/dL — ABNORMAL HIGH (ref <1.0)

## 2019-05-25 LAB — COMPREHENSIVE METABOLIC PANEL
ALT: 33 U/L (ref 0–44)
AST: 53 U/L — ABNORMAL HIGH (ref 15–41)
Albumin: 2.8 g/dL — ABNORMAL LOW (ref 3.5–5.0)
Alkaline Phosphatase: 109 U/L (ref 38–126)
Anion gap: 9 (ref 5–15)
BUN: 15 mg/dL (ref 8–23)
CO2: 25 mmol/L (ref 22–32)
Calcium: 8.5 mg/dL — ABNORMAL LOW (ref 8.9–10.3)
Chloride: 102 mmol/L (ref 98–111)
Creatinine, Ser: 0.69 mg/dL (ref 0.44–1.00)
GFR calc Af Amer: >60 mL/min (ref >60)
GFR calc non Af Amer: >60 mL/min (ref >60)
Glucose, Bld: 68 mg/dL — ABNORMAL LOW (ref 70–99)
Potassium: 3.7 mmol/L (ref 3.5–5.1)
Sodium: 136 mmol/L (ref 135–145)
Total Bilirubin: 0.8 mg/dL (ref 0.3–1.2)
Total Protein: 6.6 g/dL (ref 6.5–8.1)

## 2019-05-25 LAB — CBC
HCT: 33.8 % — ABNORMAL LOW (ref 36.0–46.0)
Hemoglobin: 11.2 g/dL — ABNORMAL LOW (ref 12.0–15.0)
MCH: 29.7 pg (ref 26.0–34.0)
MCHC: 33.1 g/dL (ref 30.0–36.0)
MCV: 89.7 fL (ref 80.0–100.0)
Platelets: 444 10*3/uL — ABNORMAL HIGH (ref 150–400)
RBC: 3.77 MIL/uL — ABNORMAL LOW (ref 3.87–5.11)
RDW: 13.5 % (ref 11.5–15.5)
WBC: 5.3 10*3/uL (ref 4.0–10.5)
nRBC: 0 % (ref 0.0–0.2)

## 2019-05-25 LAB — GLUCOSE, CAPILLARY
Glucose-Capillary: 70 mg/dL (ref 70–99)
Glucose-Capillary: 131 mg/dL — ABNORMAL HIGH (ref 70–99)
Glucose-Capillary: 128 mg/dL — ABNORMAL HIGH (ref 70–99)

## 2019-05-25 LAB — CK: Total CK: 25 U/L — ABNORMAL LOW (ref 38–234)

## 2019-05-25 MED ORDER — METHOCARBAMOL 500 MG PO TABS
750.0000 mg | ORAL_TABLET | Freq: Three times a day (TID) | ORAL | Status: DC
  Administered 2019-05-25 – 2019-05-30 (×15): 750 mg via ORAL
  Filled 2019-05-25 (×15): qty 2

## 2019-05-25 NOTE — Plan of Care (Signed)
Teaching continued with patient via [place] interpreter. Will require reinforcement d/t confusion/forgetfulness. Will cont teaching w/family via phone. Discharge planning ongoing, plan for d/c to SNF when ready. VSS at this time. Remains afebrile. SpO2 stable on RA. Encouraging OOB with 1 assist and walker to [facility]. Emotional support provided to patient with assistance of spanish interpreter. Encouraging PO intake. Vdg on commode. +BS/+BM 05/25/19. Safe environment of care maintained. Bed alarm on. Call light in reach. Frequent checks. WOCN consulted for wounds to b/l feet.

## 2019-05-25 NOTE — Progress Notes (Signed)
Assessment completed with the use of interpreter, Quillian Quince, assessment completed and all questions were answered. Given specific instructions on to call if needed and how to use the call bell with the use of the interpreter. All orientation questions were correct, except time frame she thinks it is still 1999.

## 2019-05-25 NOTE — Plan of Care (Signed)
Plan reviewed with the patient with assistance of an interpreter.

## 2019-05-25 NOTE — Progress Notes (Addendum)
PROGRESS NOTE  Emily Hoover YQM:[id number] DOB: 1945/12/01 DOA: 05/18/2019  PCP: Charlott Rakes, MD  Brief History/Interval Summary:  Emily Hoover is a 73 y.o. year old female with medical history significant for HTN, DM, left hip infectious myositis and MRSA bacteremia in 2017 who presented on 05/18/2019 with left sided pain after fall 2 days prior to admission where she landed on her rib regio and was found to have multiple left sided rib fractures.   on admission blood glucose of greater than 400 without anion gap and pseudohyponatremia 133. Hospital course complicated by persistent pain from rib cage fractures requiring IV pain control, COVID infection without symptoms, hypertensive urgency   Reason for Visit: Left sided rib fractures.  Poorly controlled pain.  COVID-19 infection  Consultants: None  Procedures: None  Antibiotics: Anti-infectives (From admission, onward)   None      Subjective/Interval History: Interpreter services were utilized to talk to the patient.  Patient seems to be much more comfortable this morning.  Overnight events noted.  She was likely experiencing sundowning.  She states that her left-sided rib cage pain is much better.  Denies any hip or lower extremity pain this morning.  Denies any other complaints.  Despite using interpreter services history is somewhat limited.    Assessment/Plan:  Multiple left-sided rib fractures with persistent pain with minimal movement Patient seems to be stable.  Not hypoxic.  Pain appears to be better controlled with the scheduled acetaminophen and tramadol.  She is also on scheduled Robaxin.  PT and OT.  Incentive spirometry.  She was seen by trauma surgery at [facility] and was cleared by them.  Consider cutting back on the dose of Robaxin.  Bilateral hip pain left more than right Despite using interpreter it was very difficult to obtain history from her.  After multiple lines of questioning I was able to determine  that she has had this hip pain for a few months now.  She usually does not require any assistance at home with walking.  She had a CT scan of abdomen pelvis which did not reveal any fractures in her joints.  3 years ago in the setting of MRSA bacteremia she was found to have myositis involving the adductor and iliopsoas muscles.  Because of this MRI was ordered.  It looks like they tried to do the MRI on 11/14 but patient was not able to cooperate.  CK level was normal.  ESR and CRP were elevated which could be from Covid.  With normal CK level that possibility of significant myositis is low.  No fractures identified on imaging studies.  Hold off on MRI for now.  PT and OT evaluation.    Unwitnessed fall It remains unclear if she had any kind of syncopal episode.  History has been limited.  PT and OT evaluation.  It appears that she may need skilled nursing facility for short-term rehab.    COVID-19 infection She remains asymptomatic.  Saturating normal on room air.  She is afebrile.  Continue vitamin C and zinc.  No indication for steroids or remdesivir.  CRP improved to 2.2.  Constipation Significant stool burden noted on imaging studies.  She is on bowel regimen.  TSH was normal last year.  We will recheck.  She has had bowel movements.  Diabetes mellitus type 2, uncontrolled with intermittent hypoglycemic episodes HbA1c 11.7. Due to hypoglycemia in the hospital Lantus was discontinued.  Holding her metformin.  Monitor CBGs.    Essential hypertension Continue  lisinopril and amlodipine.  Blood pressures noted to be elevated at times.  Continue to monitor for now.  Pain could be contributing.  History of hyperlipidemia Continue statin.  Normocytic anemia Hemoglobin is stable.  No evidence of overt loss.  Hyponatremia Sodium level is normal today.   DVT Prophylaxis: Lovenox Code Status: Full code Family Communication: Son was updated yesterday. Disposition Plan: She will need skilled  nursing facility for rehab when ready for discharge.  PT and OT to reevaluate.   Medications:  Scheduled: . acetaminophen  650 mg Oral TID  . amLODipine  10 mg Oral Daily  . atorvastatin  20 mg Oral Daily  . enoxaparin (LOVENOX) injection  40 mg Subcutaneous Daily  . insulin aspart  0-9 Units Subcutaneous TID AC  . lisinopril  5 mg Oral Daily  . methocarbamol  1,000 mg Oral TID  . polyethylene glycol  17 g Oral BID  . senna-docusate  2 tablet Oral BID  . sodium chloride flush  3 mL Intravenous Q12H  . traMADol  50 mg Oral TID  . traMADol  50 mg Oral Once  . vitamin C  500 mg Oral Daily  . zinc sulfate  220 mg Oral Daily   Continuous:  BZM:CEYEMVVKP, guaiFENesin-dextromethorphan, labetalol, morphine injection, ondansetron **OR** ondansetron (ZOFRAN) IV   Objective:  Vital Signs  Vitals:   05/24/19 1600 05/24/19 2046 05/25/19 0425 05/25/19 0755  BP: (!) 157/84 (!) 148/78 (!) 161/93 (!) 161/71  Pulse: 81 83 87 80  Resp: 16 13 11 12   Temp: 98.2 F (36.8 C) 97.8 F (36.6 C) 98.2 F (36.8 C) 98.2 F (36.8 C)  TempSrc: Oral Oral Oral Oral  SpO2: 99% 98% 97% 97%  Weight:        Intake/Output Summary (Last 24 hours) at 05/25/2019 1129 Last data filed at 05/25/2019 0600 Gross per 24 hour  Intake 240 ml  Output 600 ml  Net -360 ml   Filed Weights   05/20/19 0117 05/23/19 2149  Weight: 43 kg 42.8 kg    General appearance: Awake alert.  In no distress.  Mildly distracted Resp: Clear to auscultation bilaterally.  Normal effort Chest: No bruising noted over the left chest area but it is tender to palpation. Cardio: S1-S2 is normal regular.  No S3-S4.  No rubs murmurs or bruit GI: Abdomen is soft.  Nontender nondistended.  Bowel sounds are present normal.  No masses organomegaly Extremities: Normal range of motion of both lower extremities including hips and knees.   Neurologic: Disoriented.  But no focal neurological deficits noted.   Lab Results:  Data Reviewed: I  have personally reviewed following labs and imaging studies  CBC: Recent Labs  Lab 05/19/19 0816 05/20/19 0413 05/22/19 1619 05/23/19 0437 05/25/19 0435  WBC 7.1 5.3 8.4 6.6 5.3  NEUTROABS 5.6  --   --   --   --   HGB 12.5 9.8* 10.4* 10.7* 11.2*  HCT 37.2 29.0* 31.1* 32.0* 33.8*  MCV 88.8 89.0 88.6 87.9 89.7  PLT 389 348 336 370 444*    Basic Metabolic Panel: Recent Labs  Lab 05/20/19 0413 05/21/19 0431 05/22/19 0406 05/23/19 0437 05/25/19 0435  NA 137 135 134* 132* 136  K 3.6 3.4* 3.7 3.8 3.7  CL 103 101 98 96* 102  CO2 25 25 27 25 25   GLUCOSE 138* 79 63* 62* 68*  BUN 21 21 23 22 15   CREATININE 1.08* 0.85 0.57 0.54 0.69  CALCIUM 8.4* 8.5* 8.8* 8.6* 8.5*  MG 1.8 1.9 2.0 2.1  --   PHOS 3.1 3.7 4.0 3.6  --     GFR: CrCl cannot be calculated (Unknown ideal weight.).  Liver Function Tests: Recent Labs  Lab 05/20/19 0413 05/21/19 0431 05/22/19 0406 05/23/19 0437 05/25/19 0435  AST 11* 13* 16 17 53*  ALT 12 10 12 11  33  ALKPHOS 73 69 79 81 109  BILITOT 0.8 0.9 0.8 0.9 0.8  PROT 5.9* 6.0* 6.3* 6.3* 6.6  ALBUMIN 2.4* 2.4* 2.5* 2.4* 2.8*    Cardiac Enzymes: Recent Labs  Lab 05/23/19 0437 05/25/19 0435  CKTOTAL 14* 25*     CBG: Recent Labs  Lab 05/24/19 0747 05/24/19 1147 05/24/19 1607 05/24/19 2050 05/25/19 0732  GLUCAP 86 88 91 114* 70    Anemia Panel: Recent Labs    05/23/19 0437  FERRITIN 74    Recent Results (from the past 240 hour(s))  SARS CORONAVIRUS 2 (TAT 6-24 HRS) Nasopharyngeal Nasopharyngeal Swab     Status: Abnormal   Collection Time: 05/19/19 11:50 AM   Specimen: Nasopharyngeal Swab  Result Value Ref Range Status   SARS Coronavirus 2 POSITIVE (A) NEGATIVE Final    Comment: RESULT CALLED TO, READ BACK BY AND VERIFIED WITH: C BAIN,RN 1819 05/19/2019 D BRADLEY (NOTE) SARS-CoV-2 target nucleic acids are DETECTED. The SARS-CoV-2 RNA is generally detectable in upper and lower respiratory specimens during the acute phase of  infection. Positive results are indicative of active infection with SARS-CoV-2. Clinical  correlation with patient history and other diagnostic information is necessary to determine patient infection status. Positive results do  not rule out bacterial infection or co-infection with other viruses. The expected result is Negative. Fact Sheet for Patients: SugarRoll.be Fact Sheet for Healthcare Providers: https://www.woods-mathews.com/ This test is not yet approved or cleared by the [place] FDA and  has been authorized for detection and/or diagnosis of SARS-CoV-2 by FDA under an Emergency Use Authorization (EUA). This EUA will remain  in effect (meaning this test can be used) for t he duration of the COVID-19 declaration under Section 564(b)(1) of the Act, 21 U.S.C. section 360bbb-3(b)(1), unless the authorization is terminated or revoked sooner. Performed at [facility], [place] [address] [place],  [address]       Radiology Studies: No results found.     LOS: 5 days   Emily Hoover [employer] on [email or web address]  05/25/2019, 11:29 AM

## 2019-05-25 NOTE — Progress Notes (Signed)
Physical Therapy Treatment Patient Details Name: Emily Hoover MRN: [id number] DOB: 1945-09-15 Today's Date: 05/25/2019    History of Present Illness 73 y.o. female with medical history significant of hypertension, hyperlipidemia, and uncontrolled diabetes mellitus type 2.  She presented last night with complaints of left-sided pain after having a fall 05/18/19. Due to difficulty with translation cause of fall unclear, chart reveals hx vertigo/syncope. Presented to ED 05/19/19, chest xray revealed fx of L lateral ribs 5-7 and L posterior ribs 10-11. Also found to have BP 194/90 EKG reveals first degree heart block. COVID (+).     PT Comments    Patient moves without attending to therapist's instructions (via video interpreter). She did not lose her balance, however was at one point unable to step forward due to wheel of RW caught on table. She was unable to identify the problem and just kept trying to roll walker forward. She is moving much better than when last seen by therapy, however continues to require 24/7 assist.     Follow Up Recommendations  SNF     Equipment Recommendations  Other (comment)(TBD at next venue)    Recommendations for Other Services       Precautions / Restrictions Precautions Precautions: Fall Restrictions Weight Bearing Restrictions: No    Mobility  Bed Mobility Overal bed mobility: Needs Assistance Bed Mobility: Sit to Supine       Sit to supine: Min assist   General bed mobility comments: required assist to raise 2nd leg up onto bed  Transfers Overall transfer level: Needs assistance Equipment used: Rolling walker (2 wheeled) Transfers: Sit to/from Stand Sit to Stand: Min assist         General transfer comment: from recliner; pt pulls up on RW; interpreter tries to instruct and she talks over him  Ambulation/Gait Ambulation/Gait assistance: Min assist Gait Distance (Feet): 12 Feet Assistive device: Rolling walker (2 wheeled) Gait  Pattern/deviations: Step-through pattern;Decreased stance time - left;Decreased weight shift to left;Antalgic;Decreased stride length Gait velocity: slowed   General Gait Details: required assist to maneuver RW (did not notice left front wheel was caught on nearby furniture)   Marine scientist [place] (Stroke Patients Only)       Balance Overall balance assessment: Needs assistance Sitting-balance support: No upper extremity supported;Feet supported Sitting balance-Leahy Scale: Fair     Standing balance support: Bilateral upper extremity supported;During functional activity Standing balance-Leahy Scale: Poor Standing balance comment: Reliant on UE support                             Cognition Arousal/Alertness: Awake/alert Behavior During Therapy: WFL for tasks assessed/performed Overall Cognitive Status: No family/caregiver present to determine baseline cognitive functioning                                 General Comments: Requires Video Spanish translator Catering manager Comments        Pertinent Vitals/Pain Pain Assessment: Faces Faces Pain Scale: Hurts even more(while sit>supine) Pain Location: left side/ribs Pain Descriptors / Indicators: Grimacing;Guarding Pain Intervention(s): Limited activity within patient's tolerance;Monitored during session;Repositioned    Home Living  Prior Function            PT Goals (current goals can now be found in the care plan section) Acute Rehab PT Goals Patient Stated Goal: Find her documents PT Goal Formulation: With patient Time For Goal Achievement: 06/03/19 Potential to Achieve Goals: Fair Progress towards PT goals: Progressing toward goals    Frequency    Min 2X/week      PT Plan Current plan remains appropriate    Co-evaluation              AM-PAC PT "6 Clicks" Mobility    Outcome Measure  Help needed turning from your back to your side while in a flat bed without using bedrails?: A Little Help needed moving from lying on your back to sitting on the side of a flat bed without using bedrails?: A Lot Help needed moving to and from a bed to a chair (including a wheelchair)?: A Little Help needed standing up from a chair using your arms (e.g., wheelchair or bedside chair)?: A Little Help needed to walk in hospital room?: A Little Help needed climbing 3-5 steps with a railing? : A Lot 6 Click Score: 16    End of Session   Activity Tolerance: Patient limited by pain;No increased pain Patient left: with call bell/phone within reach;in bed;with bed alarm set Nurse Communication: Mobility status;Other (comment)(back to bed) PT Visit Diagnosis: Unsteadiness on feet (R26.81);Other abnormalities of gait and mobility (R26.89);Muscle weakness (generalized) (M62.81);History of falling (Z91.81);Difficulty in walking, not elsewhere classified (R26.2);Pain Pain - Right/Left: Left Pain - part of body: (ribs)     Time: [id number] PT Time Calculation (min) (ACUTE ONLY): 14 min  Charges:  $Gait Training: 8-22 mins                      Barry Brunner, PT     Rexanne Mano 05/25/2019, 5:24 PM

## 2019-05-25 NOTE — Consult Note (Addendum)
[place] Nurse Consult Note: Pt is on isolation for Covid-19.  Reviewed the progress notes and called bedside nurse to discuss patient; consult requested for bilateral necrotic toe wounds, according to the EMR.  Bedside nurse states she will enter photos into the chart for the [place] nurse to review and provide topical treatment recommendations when they are available. Julien Girt MSN, RN, CWOCN, CWCN-AP, CNS  12:40: Later note:  Bedside nurse took Proofreader app, but was unable to have them download into the EMR when she attempted the process. Discussed appearance of feet and toes. Pt has black, crumbling dry toenails to BLE. Inner plantar feet with dry raised eschar callous areas; black without open wounds or drainage. Measurements obtained from the nursing flow sheet. Right foot .8X1.4cm Left foot .8X1cm Topical treatment orders provided for bedside nurses to perform daily as follows:  Paint dry callous areas to bilat inner feet Q day with a Betadine swab and leave open to air. Float heels to reduce pressure. Pt should followup with podiatry after discharge. Please re-consult if further assistance is needed.  Thank-you,  Julien Girt MSN, [place], [place], [place], [place]

## 2019-05-26 LAB — GLUCOSE, CAPILLARY
Glucose-Capillary: 121 mg/dL — ABNORMAL HIGH (ref 70–99)
Glucose-Capillary: 127 mg/dL — ABNORMAL HIGH (ref 70–99)
Glucose-Capillary: 202 mg/dL — ABNORMAL HIGH (ref 70–99)
Glucose-Capillary: 58 mg/dL — ABNORMAL LOW (ref 70–99)
Glucose-Capillary: 120 mg/dL — ABNORMAL HIGH (ref 70–99)

## 2019-05-26 LAB — BASIC METABOLIC PANEL
Anion gap: 8 (ref 5–15)
BUN: 16 mg/dL (ref 8–23)
CO2: 25 mmol/L (ref 22–32)
Calcium: 8.3 mg/dL — ABNORMAL LOW (ref 8.9–10.3)
Chloride: 101 mmol/L (ref 98–111)
Creatinine, Ser: 0.5 mg/dL (ref 0.44–1.00)
GFR calc Af Amer: >60 mL/min (ref >60)
GFR calc non Af Amer: >60 mL/min (ref >60)
Glucose, Bld: 120 mg/dL — ABNORMAL HIGH (ref 70–99)
Potassium: 3.9 mmol/L (ref 3.5–5.1)
Sodium: 134 mmol/L — ABNORMAL LOW (ref 135–145)

## 2019-05-26 LAB — TSH: TSH: 2.56 u[IU]/mL (ref 0.350–4.500)

## 2019-05-26 NOTE — Progress Notes (Signed)
Margarita updated

## 2019-05-26 NOTE — Progress Notes (Signed)
PROGRESS NOTE  Emily Hoover [id number] DOB: [birth year] DOA: 05/18/2019  PCP: Charlott Rakes, MD  Brief History/Interval Summary:  Emily Hoover is a 73 y.o. year old female with medical history significant for HTN, DM, left hip infectious myositis and MRSA bacteremia in 2017 who presented on 05/18/2019 with left sided pain after fall 2 days prior to admission where she landed on her rib regio and was found to have multiple left sided rib fractures.   on admission blood glucose of greater than 400 without anion gap and pseudohyponatremia 133. Hospital course complicated by persistent pain from rib cage fractures requiring IV pain control, COVID infection without symptoms, hypertensive urgency   Reason for Visit: Left sided rib fractures.  Poorly controlled pain.  COVID-19 infection  Consultants: None  Procedures: None  Antibiotics: Anti-infectives (From admission, onward)   None      Subjective/Interval History: Interpreter services were utilized to talk to the patient.  The patient mentions that her pain in the left chest area is improving.  Denies any pain in the legs.  Slept well overnight.  No nausea vomiting.  Good appetite.     Assessment/Plan:  Multiple left-sided rib fractures with persistent pain with minimal movement Patient seems to be stable.  Not hypoxic.  Remains on scheduled acetaminophen and tramadol along with Robaxin.  Dose of Robaxin was decreased yesterday.  PT and OT.  Incentive spirometry.  She was seen by trauma surgery at [facility] and was cleared by them.  Overall patient is stable.  Pain is improved significantly over the last few days.  Bilateral hip pain left more than right Despite using interpreter it was very difficult to obtain history from her.  After multiple lines of questioning I was able to determine that she has had this hip pain for a few months now.  She usually does not require any assistance at home with walking.  She had a CT scan of  abdomen pelvis which did not reveal any fractures in her joints.  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  Because of this history MRI was ordered while she was at [facility].  This was attempted on 11/14 but patient was not able to cooperate.  However her CK level has been normal.  ESR and CRP were elevated which could be from Covid.  With normal CK level that possibility of significant myositis is low.  No fractures identified on imaging studies.  Hold off on MRI for now.  Her symptoms have improved.  She has been able to ambulate.   Unwitnessed fall It remains unclear if she had any kind of syncopal episode.  History has been limited.  PT and OT evaluation.  It appears that she may need skilled nursing facility for short-term rehab.    COVID-19 infection She remains asymptomatic.  Saturating normal on room air.  She is afebrile.  Continue vitamin C and zinc.  No indication for steroids or remdesivir.  CRP improved to 2.2.  Constipation Significant stool burden noted on imaging studies.  She is on bowel regimen.  She has had bowel movements.  TSH normal at 2.56.    Diabetes mellitus type 2, uncontrolled with intermittent hypoglycemic episodes HbA1c 11.7. Due to hypoglycemia in the hospital Lantus was discontinued.  Holding her metformin.  No further episodes of hypoglycemia.  Continue to monitor.    Essential hypertension Continue lisinopril and amlodipine.  Blood pressures  noted to be elevated at times.  Continue to monitor for now.  Pain could be contributing.  History of hyperlipidemia Continue statin.  Normocytic anemia Hemoglobin is stable.  No evidence of overt loss.  Hyponatremia Stable.  DVT Prophylaxis: Lovenox Code Status: Full code Family Communication: Son updated on a daily basis. Disposition Plan: PT and OT has recommended skilled nursing facility.  Social worker consulted.      Medications:  Scheduled: . acetaminophen  650 mg Oral TID  . amLODipine  10 mg Oral Daily  . atorvastatin  20 mg Oral Daily  . enoxaparin (LOVENOX) injection  40 mg Subcutaneous Daily  . insulin aspart  0-9 Units Subcutaneous TID AC  . lisinopril  5 mg Oral Daily  . methocarbamol  750 mg Oral TID  . polyethylene glycol  17 g Oral BID  . senna-docusate  2 tablet Oral BID  . sodium chloride flush  3 mL Intravenous Q12H  . traMADol  50 mg Oral TID  . traMADol  50 mg Oral Once  . vitamin C  500 mg Oral Daily  . zinc sulfate  220 mg Oral Daily   Continuous:  LYH:TMBPJPETK, guaiFENesin-dextromethorphan, labetalol, morphine injection, ondansetron **OR** ondansetron (ZOFRAN) IV   Objective:  Vital Signs  Vitals:   05/25/19 2009 05/26/19 0404 05/26/19 0808 05/26/19 1005  BP:  (!) 142/79 (!) 156/81 124/62  Pulse: 91 82 83   Resp:   14   Temp:   97.9 F (36.6 C)   TempSrc:   Oral   SpO2: 97% 97% 99%   Weight:        Intake/Output Summary (Last 24 hours) at 05/26/2019 1133 Last data filed at 05/26/2019 0400 Gross per 24 hour  Intake 600 ml  Output 150 ml  Net 450 ml   Filed Weights   05/20/19 0117 05/23/19 2149  Weight: 43 kg 42.8 kg    General appearance: Awake alert.  In no distress.  Mildly distracted Resp: Clear to auscultation bilaterally.  Normal effort No bruising noted over the left chest area but is tender to palpation. Cardio: S1-S2 is normal regular.  No S3-S4.  No rubs murmurs or bruit GI: Abdomen is soft.  Nontender nondistended.  Bowel sounds are present normal.  No masses organomegaly Extremities: Good range of motion of bilateral lower extremities Neurologic:  No focal neurological deficits.    Lab Results:  Data Reviewed: I have personally reviewed following labs and imaging studies  CBC: Recent Labs  Lab 05/20/19 0413 05/22/19 1619 05/23/19 0437 05/25/19 0435  WBC 5.3 8.4 6.6 5.3  HGB 9.8* 10.4* 10.7* 11.2*  HCT 29.0* 31.1* 32.0* 33.8*   MCV 89.0 88.6 87.9 89.7  PLT 348 336 370 444*    Basic Metabolic Panel: Recent Labs  Lab 05/20/19 0413 05/21/19 0431 05/22/19 0406 05/23/19 0437 05/25/19 0435 05/26/19 0435  NA 137 135 134* 132* 136 134*  K 3.6 3.4* 3.7 3.8 3.7 3.9  CL 103 101 98 96* 102 101  CO2 25 25 27 25 25 25   GLUCOSE 138* 79 63* 62* 68* 120*  BUN 21 21 23 22 15 16   CREATININE 1.08* 0.85 0.57 0.54 0.69 0.50  CALCIUM 8.4* 8.5* 8.8* 8.6* 8.5* 8.3*  MG 1.8 1.9 2.0 2.1  --   --   PHOS 3.1 3.7 4.0 3.6  --   --     GFR: CrCl cannot be calculated (Unknown ideal weight.).  Liver Function Tests: Recent Labs  Lab 05/20/19 0413 05/21/19 0431  05/22/19 0406 05/23/19 0437 05/25/19 0435  AST 11* 13* 16 17 53*  ALT 12 10 12 11  33  ALKPHOS 73 69 79 81 109  BILITOT 0.8 0.9 0.8 0.9 0.8  PROT 5.9* 6.0* 6.3* 6.3* 6.6  ALBUMIN 2.4* 2.4* 2.5* 2.4* 2.8*    Cardiac Enzymes: Recent Labs  Lab 05/23/19 0437 05/25/19 0435  CKTOTAL 14* 25*     CBG: Recent Labs  Lab 05/25/19 0732 05/25/19 1154 05/25/19 1554 05/25/19 2145 05/26/19 0807  GLUCAP 70 131* 121* 128* 127*     Recent Results (from the past 240 hour(s))  SARS CORONAVIRUS 2 (TAT 6-24 HRS) Nasopharyngeal Nasopharyngeal Swab     Status: Abnormal   Collection Time: 05/19/19 11:50 AM   Specimen: Nasopharyngeal Swab  Result Value Ref Range Status   SARS Coronavirus 2 POSITIVE (A) NEGATIVE Final    Comment: RESULT CALLED TO, READ BACK BY AND VERIFIED WITH: C BAIN,RN 1819 05/19/2019 D BRADLEY (NOTE) SARS-CoV-2 target nucleic acids are DETECTED. The SARS-CoV-2 RNA is generally detectable in upper and lower respiratory specimens during the acute phase of infection. Positive results are indicative of active infection with SARS-CoV-2. Clinical  correlation with patient history and other diagnostic information is necessary to determine patient infection status. Positive results do  not rule out bacterial infection or co-infection with other viruses.  The expected result is Negative. Fact Sheet for Patients: SugarRoll.be Fact Sheet for Healthcare Providers: https://www.woods-mathews.com/ This test is not yet approved or cleared by the [place] FDA and  has been authorized for detection and/or diagnosis of SARS-CoV-2 by FDA under an Emergency Use Authorization (EUA). This EUA will remain  in effect (meaning this test can be used) for t he duration of the COVID-19 declaration under Section 564(b)(1) of the Act, 21 U.S.C. section 360bbb-3(b)(1), unless the authorization is terminated or revoked sooner. Performed at [facility], [place] [address], [place], [place] [address]       Radiology Studies: No results found.     LOS: 6 days   Tema Alire [employer] on [email or web address]  05/26/2019, 11:33 AM

## 2019-05-26 NOTE — TOC Initial Note (Signed)
Transition of Care [facility]) - Initial/Assessment Note    Patient Details  Name: Emily Hoover MRN: [id number] Date of Birth: [birth year]  [facility] CM/SW Contact:    Ninfa Meeker, RN Phone Number: 05/26/2019, 2:56 PM  Clinical Narrative:                 Rico Sheehan a 73 y.o.year old femalewith medical history significant for HTN, DM, who presented on 11/9/2020with left sided pain after fall 2 days prior to admission where she landed on her rib region and was found to have multiple left sided rib fractures. Admitted and transferred to [facility]  for uncontrolled pain and COVID infection without symptoms, hypertensive urgency. Case manager acknowledges order for CM to arrange for SNF. Patient is undocumented/uninsured, Case manager will consult with Leadership as to how we proceed.            Patient Goals and CMS Choice        Expected Discharge Plan and Services                                                Prior Living Arrangements/Services                       Activities of Daily Living Home Assistive Devices/Equipment: None ADL Screening (condition at time of admission) Patient's cognitive ability adequate to safely complete daily activities?: Yes Is the patient deaf or have difficulty hearing?: No Does the patient have difficulty seeing, even when wearing glasses/contacts?: No Does the patient have difficulty concentrating, remembering, or making decisions?: No Patient able to express need for assistance with ADLs?: Yes Does the patient have difficulty dressing or bathing?: No Independently performs ADLs?: Yes (appropriate for developmental age) Communication: Independent with device (comment) Dressing (OT): Independent Is this a change from baseline?: Pre-admission baseline Grooming: Independent Is this a change from baseline?: Pre-admission baseline Feeding: Independent Is this a change from baseline?: Pre-admission  baseline Bathing: Independent Is this a change from baseline?: Pre-admission baseline Toileting: Independent Is this a change from baseline?: Pre-admission baseline In/Out Bed: Independent Is this a change from baseline?: Pre-admission baseline Walks in Home: Independent Is this a change from baseline?: Pre-admission baseline Does the patient have difficulty walking or climbing stairs?: No Weakness of Legs: None Weakness of Arms/Hands: None  Permission Sought/Granted                  Emotional Assessment              Admission diagnosis:  Fall [W19.XXXA] Hyperglycemia [R73.9] Fall, initial encounter [W19.XXXA] Closed fracture of left inferior pubic ramus, initial encounter ([place]) [S32.592A] Multiple fractures of ribs, left side, initial encounter for closed fracture [S22.42XA] Multiple rib fractures [S22.49XA] Patient Active Problem List   Diagnosis Date Noted  . Constipation 05/23/2019  . Pain in the abdomen 05/23/2019  . COVID-19 virus infection 05/20/2019  . Multiple rib fractures 05/19/2019  . Fall at home, initial encounter 05/19/2019  . Hypertensive urgency 05/19/2019  . Uncontrolled type 2 diabetes mellitus with hyperglycemia ([place]) 05/19/2019  . Non-compliance 07/07/2018  . Syncope 11/18/2017  . Hypothermia 11/18/2017  . Hypercholesteremia 10/30/2017  . 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 Renal insufficiency 10/10/2015  . Staphylococcus aureus bacteremia 09/19/2015  . UTI (lower urinary tract infection) 09/16/2015  . Hip pain 09/16/2015  .  Hypertension 09/16/2015  . Pain management 09/16/2015  . Vertigo 09/07/2015  . Labyrinthitis 09/07/2015  . Neck pain 09/07/2015  . Diabetes mellitus without complication ([place]) [id number]  . Labyrinthitis of right ear   . Chronic neck pain   . Uncontrollable vomiting   . Diabetes ([place]) 06/30/2013   PCP:  Charlott Rakes, MD Pharmacy:   [facility], [place] [address] [place] [address] Phone: [phone] Fax: [phone]     Social Determinants of Health (SDOH) Interventions    Readmission Risk Interventions No flowsheet data found.

## 2019-05-27 DIAGNOSIS — S2249XS Multiple fractures of ribs, unspecified side, sequela: Secondary | ICD-10-CM

## 2019-05-27 LAB — GLUCOSE, CAPILLARY
Glucose-Capillary: 134 mg/dL — ABNORMAL HIGH (ref 70–99)
Glucose-Capillary: 116 mg/dL — ABNORMAL HIGH (ref 70–99)
Glucose-Capillary: 205 mg/dL — ABNORMAL HIGH (ref 70–99)
Glucose-Capillary: 169 mg/dL — ABNORMAL HIGH (ref 70–99)
Glucose-Capillary: 179 mg/dL — ABNORMAL HIGH (ref 70–99)

## 2019-05-27 NOTE — Progress Notes (Signed)
PROGRESS NOTE  Emily Hoover [id number] DOB: [birth year] DOA: 05/18/2019  PCP: Emily Rakes, MD  Brief History/Interval Summary:  Emily Hoover is a 73 y.o. year old female with medical history significant for HTN, DM, left hip infectious myositis and MRSA bacteremia in 2017 who presented on 05/18/2019 with left sided pain after fall 2 days prior to admission where she landed on her rib regio and was found to have multiple left sided rib fractures.   on admission blood glucose of greater than 400 without anion gap and pseudohyponatremia 133. Hospital course complicated by persistent pain from rib cage fractures requiring IV pain control, COVID infection without symptoms, hypertensive urgency   Reason for Visit: Left sided rib fractures.  Poorly controlled pain.  COVID-19 infection  Consultants: None  Procedures: None  Antibiotics: Anti-infectives (From admission, onward)   None      Subjective/Interval History: Interpreter services were utilized to talk to the patient.  Report rib cage/chest pain significantly improved, reports she was able to have a good night sleep, reports good appetite, no nausea or vomiting .  Assessment/Plan:  Multiple left-sided rib fractures with persistent pain with minimal movement Patient seems to be stable.  Not hypoxic.  Remains on scheduled acetaminophen and tramadol along with Robaxin.  Dose of Robaxin was decreased yesterday.  PT and OT.  Incentive spirometry.  She was seen by trauma surgery at [facility] and was cleared by them.  Overall patient is stable.  Pain is improved significantly over the last few days.  Bilateral hip pain left more than right Despite using interpreter it was very difficult to obtain history from her.  After multiple lines of questioning I was able to determine that she has had this hip pain for a few months now.  She usually does not require any assistance at home with walking.  She had a CT scan of abdomen pelvis which  did not reveal any fractures in her joints.  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  Because of this history MRI was ordered while she was at [facility].  This was attempted on 11/14 but patient was not able to cooperate.  However her CK level has been normal.  ESR and CRP were elevated which could be from Covid.  With normal CK level that possibility of significant myositis is low.  No fractures identified on imaging studies.  Hold off on MRI for now.  Her symptoms have improved.  She has been able to ambulate.   Unwitnessed fall It remains unclear if she had any kind of syncopal episode.  History has been limited.  PT and OT evaluation.  It appears that she may need skilled nursing facility for short-term rehab.    COVID-19 infection She remains asymptomatic.  Saturating normal on room air.  She is afebrile.  Continue vitamin C and zinc.  No indication for steroids or remdesivir.  CRP improved to 2.2.  Constipation Significant stool burden noted on imaging studies.  She is on bowel regimen.  She has had bowel movements.  TSH normal at 2.56.    Diabetes mellitus type 2, uncontrolled with intermittent hypoglycemic episodes HbA1c 11.7. Due to hypoglycemia in the hospital Lantus was discontinued.  Holding her metformin.  No further episodes of hypoglycemia.  Continue to monitor.    Essential hypertension Continue lisinopril and amlodipine.  Blood pressures noted to be elevated at times.  Continue to monitor  for now.  Pain could be contributing.  History of hyperlipidemia Continue statin.  Normocytic anemia Hemoglobin is stable.  No evidence of overt loss.  Hyponatremia Stable.  DVT Prophylaxis: Lovenox Code Status: Full code Family Communication: Disussed with patient via video interpreter Disposition Plan: PT and OT has recommended skilled nursing facility.  Social worker consulted.      Medications:  Scheduled: . acetaminophen  650 mg Oral TID  . amLODipine  10 mg Oral Daily  . atorvastatin  20 mg Oral Daily  . enoxaparin (LOVENOX) injection  40 mg Subcutaneous Daily  . insulin aspart  0-9 Units Subcutaneous TID AC  . lisinopril  5 mg Oral Daily  . methocarbamol  750 mg Oral TID  . polyethylene glycol  17 g Oral BID  . senna-docusate  2 tablet Oral BID  . sodium chloride flush  3 mL Intravenous Q12H  . traMADol  50 mg Oral TID  . traMADol  50 mg Oral Once  . vitamin C  500 mg Oral Daily  . zinc sulfate  220 mg Oral Daily   Continuous:  MBT:DHRCBULAG, guaiFENesin-dextromethorphan, labetalol, morphine injection, ondansetron **OR** ondansetron (ZOFRAN) IV   Objective:  Vital Signs  Vitals:   05/26/19 2115 05/27/19 0449 05/27/19 0756 05/27/19 1024  BP:  (!) 151/85 (!) 161/90 131/63  Pulse: 77 85 84   Resp:  20 16   Temp:  98.1 F (36.7 C) 98.3 F (36.8 C)   TempSrc:  Oral Oral   SpO2: 100% 98% 98%   Weight:        Intake/Output Summary (Last 24 hours) at 05/27/2019 1158 Last data filed at 05/27/2019 1100 Gross per 24 hour  Intake 240 ml  Output 475 ml  Net -235 ml   Filed Weights   05/20/19 0117 05/23/19 2149  Weight: 43 kg 42.8 kg    Awake Alert, Oriented X 3, No new F.N deficits, Normal affect, small frame, but pleasant and appears comfortable Symmetrical Chest wall movement, Good air movement bilaterally, CTAB RRR,No Gallops,Rubs or new Murmurs, No Parasternal Heave +ve B.Sounds, Abd Soft, No tenderness, No rebound - guarding or rigidity. No Cyanosis, Clubbing or edema, No new Rash or bruise      Lab Results:  Data Reviewed: I have personally reviewed following labs and imaging studies  CBC: Recent Labs  Lab 05/22/19 1619 05/23/19 0437 05/25/19 0435  WBC 8.4 6.6 5.3  HGB 10.4* 10.7* 11.2*  HCT 31.1* 32.0* 33.8*  MCV 88.6 87.9 89.7  PLT 336 370 444*    Basic Metabolic Panel: Recent Labs  Lab 05/21/19 0431 05/22/19 0406  05/23/19 0437 05/25/19 0435 05/26/19 0435  NA 135 134* 132* 136 134*  K 3.4* 3.7 3.8 3.7 3.9  CL 101 98 96* 102 101  CO2 25 27 25 25 25   GLUCOSE 79 63* 62* 68* 120*  BUN 21 23 22 15 16   CREATININE 0.85 0.57 0.54 0.69 0.50  CALCIUM 8.5* 8.8* 8.6* 8.5* 8.3*  MG 1.9 2.0 2.1  --   --   PHOS 3.7 4.0 3.6  --   --     GFR: CrCl cannot be calculated (Unknown ideal weight.).  Liver Function Tests: Recent Labs  Lab 05/21/19 0431 05/22/19 0406 05/23/19 0437 05/25/19 0435  AST 13* 16 17 53*  ALT 10 12 11  33  ALKPHOS 69 79 81 109  BILITOT 0.9 0.8 0.9 0.8  PROT 6.0* 6.3* 6.3* 6.6  ALBUMIN 2.4* 2.5* 2.4* 2.8*    Cardiac Enzymes:  Recent Labs  Lab 05/23/19 0437 05/25/19 0435  CKTOTAL 14* 25*     CBG: Recent Labs  Lab 05/26/19 1134 05/26/19 1551 05/26/19 1734 05/26/19 1948 05/27/19 0758  GLUCAP 202* 58* 120* 134* 116*     Recent Results (from the past 240 hour(s))  SARS CORONAVIRUS 2 (TAT 6-24 HRS) Nasopharyngeal Nasopharyngeal Swab     Status: Abnormal   Collection Time: 05/19/19 11:50 AM   Specimen: Nasopharyngeal Swab  Result Value Ref Range Status   SARS Coronavirus 2 POSITIVE (A) NEGATIVE Final    Comment: RESULT CALLED TO, READ BACK BY AND VERIFIED WITH: C BAIN,RN 1819 05/19/2019 D BRADLEY (NOTE) SARS-CoV-2 target nucleic acids are DETECTED. The SARS-CoV-2 RNA is generally detectable in upper and lower respiratory specimens during the acute phase of infection. Positive results are indicative of active infection with SARS-CoV-2. Clinical  correlation with patient history and other diagnostic information is necessary to determine patient infection status. Positive results do  not rule out bacterial infection or co-infection with other viruses. The expected result is Negative. Fact Sheet for Patients: SugarRoll.be Fact Sheet for Healthcare Providers: https://www.woods-mathews.com/ This test is not yet approved or  cleared by the [place] FDA and  has been authorized for detection and/or diagnosis of SARS-CoV-2 by FDA under an Emergency Use Authorization (EUA). This EUA will remain  in effect (meaning this test can be used) for t he duration of the COVID-19 declaration under Section 564(b)(1) of the Act, 21 U.S.C. section 360bbb-3(b)(1), unless the authorization is terminated or revoked sooner. Performed at [facility], [place] [address], [place], [place] [address]       Radiology Studies: No results found.     LOS: 7 days   Phillips Climes MD  [facility] Pager on [email or web address]  05/27/2019, 11:58 AM

## 2019-05-27 NOTE — Progress Notes (Signed)
Elgergawy, MD notified that pt urine dark, has sediment and foul odor. UA performed on 11/10 and showed no bacteria. MD stated if pt has no dysuria no need for another UA. Pt not having any dysuria at this time. Will continue to monitor

## 2019-05-27 NOTE — TOC Progression Note (Signed)
Transition of Care [facility]) - Progression Note    Patient Details  Name: Emily Hoover MRN: [id number] Date of Birth: 1946-06-26  [facility] CM/SW Contact  Emily Grayer Beverely Pace, RN Phone Number: 05/27/2019, 11:10 AM  Clinical Narrative:   Case manager contacted patient's Emily Hoover, he states his mom lives with a brother and they are redoing things in the home for when she leaves rehab. Emily Hoover confirmed information for his mom. She is listed with [facility]  As Emily Hoover. FL2 is completed, will contact AD for letter of guarantee for this patient for SNF for shortterm rehab.     Expected Discharge Plan: Skilled Nursing Facility Barriers to Discharge: Other (comment)(will need letter of guarantee)  Expected Discharge Plan and Services Expected Discharge Plan: [place]   Discharge Planning Services: CM Consult   Living arrangements for the past 2 months: Mobile Home                                       Social Determinants of Health (SDOH) Interventions    Readmission Risk Interventions No flowsheet data found.

## 2019-05-27 NOTE — Progress Notes (Signed)
Per pharmacy recommendation, Morphine 1 mg IV wasted with Jacqulyn Ducking, RN as  2nd witness. 2mg  pulled from 2w pyxis 05/20/19 0300 and only 1mg  was administered to patient.  Remaining dose was never wasted and was discovered in this writer's locker in a lab coat pocket.

## 2019-05-27 NOTE — NC FL2 (Signed)
[place] LEVEL OF CARE SCREENING TOOL     IDENTIFICATION  Patient Name: Emily Hoover Birthdate: Nov 07, 1945 Sex: female Admission Date (Current Location): 05/18/2019  [facility] and [place] Number:  Herbalist and Address:  The . [facility], [place] [address], [place], [place] [address]([address]      Provider Number: [id number]  Attending Physician Name and Address:  Elgergawy, Silver Huguenin, MD  Relative Name and Phone Number:  Son: Brigitte Pulse  [phone]    Current Level of Care: Hospital Recommended Level of Care: [place] Prior Approval Number:    Date Approved/Denied:   PASRR Number: [id number]  Discharge Plan: SNF    Current Diagnoses: Patient Active Problem List   Diagnosis Date Noted  . Constipation 05/23/2019  . Pain in the abdomen 05/23/2019  . COVID-19 virus infection 05/20/2019  . Multiple rib fractures 05/19/2019  . Fall at home, initial encounter 05/19/2019  . Hypertensive urgency 05/19/2019  . Uncontrolled type 2 diabetes mellitus with hyperglycemia ([place]) 05/19/2019  . Non-compliance 07/07/2018  . Syncope 11/18/2017  . Hypothermia 11/18/2017  . Hypercholesteremia 10/30/2017  . 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 Renal insufficiency 10/10/2015  . Staphylococcus aureus bacteremia 09/19/2015  . UTI (lower urinary tract infection) 09/16/2015  . Hip pain 09/16/2015  . Hypertension 09/16/2015  . Pain management 09/16/2015  . Vertigo 09/07/2015  . Labyrinthitis 09/07/2015  . Neck pain 09/07/2015  . Diabetes mellitus without complication ([place]) 123456  . Labyrinthitis of right ear   . Chronic neck pain   . Uncontrollable vomiting   . Diabetes ([place]) 06/30/2013    Orientation RESPIRATION BLADDER Height & Weight     Self, Situation, Place  Normal Incontinent Weight: 42.8 kg Height:     BEHAVIORAL SYMPTOMS/MOOD NEUROLOGICAL BOWEL NUTRITION STATUS      Incontinent Diet  AMBULATORY STATUS  COMMUNICATION OF NEEDS Skin   Limited Assist Verbally Normal                       Personal Care Assistance Level of Assistance  Dressing, Bathing Bathing Assistance: Maximum assistance   Dressing Assistance: Limited assistance     Functional Limitations Info             SPECIAL CARE FACTORS FREQUENCY  PT (By licensed PT), OT (By licensed OT)     PT Frequency: 5x/week OT Frequency: min 3x/week            Contractures Contractures Info: Not present    Additional Factors Info  Code Status, Allergies Code Status Info: Full Allergies Info: NKDA           Current Medications (05/27/2019):  This is the current hospital active medication list Current Facility-Administered Medications  Medication Dose Route Frequency Provider Last Rate Last Dose  . acetaminophen (TYLENOL) tablet 650 mg  650 mg Oral TID Bonnielee Haff, MD   650 mg at 05/27/19 1025  . albuterol (VENTOLIN HFA) 108 (90 Base) MCG/ACT inhaler 2 puff  2 puff Inhalation Q6H PRN Oretha Milch D, MD   2 puff at 05/20/19 0904  . amLODipine (NORVASC) tablet 10 mg  10 mg Oral Daily Oretha Milch D, MD   10 mg at 05/27/19 1026  . atorvastatin (LIPITOR) tablet 20 mg  20 mg Oral Daily Oretha Milch D, MD   20 mg at 05/27/19 1026  . enoxaparin (LOVENOX) injection 40 mg  40 mg Subcutaneous Daily Desiree Hane, MD   40  mg at 05/27/19 1024  . guaiFENesin-dextromethorphan (ROBITUSSIN DM) 100-10 MG/5ML syrup 10 mL  10 mL Oral Q4H PRN Oretha Milch D, MD   10 mL at 05/20/19 0903  . insulin aspart (novoLOG) injection 0-9 Units  0-9 Units Subcutaneous TID Peri Jefferson, MD   3 Units at 05/26/19 1205  . labetalol (NORMODYNE) injection 5 mg  5 mg Intravenous Q2H PRN Oretha Milch D, MD      . lisinopril (ZESTRIL) tablet 5 mg  5 mg Oral Daily Oretha Milch D, MD   5 mg at 05/27/19 1026  . methocarbamol (ROBAXIN) tablet 750 mg  750 mg Oral TID Bonnielee Haff, MD   750 mg at 05/27/19 1025  . morphine 2 MG/ML  injection 1 mg  1 mg Intravenous Q3H PRN Oretha Milch D, MD   1 mg at 05/27/19 0147  . ondansetron (ZOFRAN) tablet 4 mg  4 mg Oral Q6H PRN Oretha Milch D, MD       Or  . ondansetron (ZOFRAN) injection 4 mg  4 mg Intravenous Q6H PRN Oretha Milch D, MD   4 mg at 05/27/19 0148  . polyethylene glycol (MIRALAX / GLYCOLAX) packet 17 g  17 g Oral BID Oretha Milch D, MD   17 g at 05/27/19 1025  . senna-docusate (Senokot-S) tablet 2 tablet  2 tablet Oral BID Oretha Milch D, MD   2 tablet at 05/27/19 1025  . sodium chloride flush (NS) 0.9 % injection 3 mL  3 mL Intravenous Q12H Oretha Milch D, MD   3 mL at 05/27/19 1026  . traMADol (ULTRAM) tablet 50 mg  50 mg Oral TID Bonnielee Haff, MD   50 mg at 05/27/19 1025  . traMADol (ULTRAM) tablet 50 mg  50 mg Oral Once Bonnielee Haff, MD      . vitamin C (ASCORBIC ACID) tablet 500 mg  500 mg Oral Daily Oretha Milch D, MD   500 mg at 05/27/19 1025  . zinc sulfate capsule 220 mg  220 mg Oral Daily Oretha Milch D, MD   220 mg at 05/27/19 1025     Discharge Medications: Please see discharge summary for a list of discharge medications.  Relevant Imaging Results:  Relevant Lab Results:   Additional Information SSN: [id number]   (speaks spanish)  Ninfa Meeker, RN

## 2019-05-28 LAB — GLUCOSE, CAPILLARY
Glucose-Capillary: 200 mg/dL — ABNORMAL HIGH (ref 70–99)
Glucose-Capillary: 269 mg/dL — ABNORMAL HIGH (ref 70–99)
Glucose-Capillary: 105 mg/dL — ABNORMAL HIGH (ref 70–99)

## 2019-05-28 NOTE — Progress Notes (Signed)
[place] updates via [employer]   Pt refused a walk, states she is too tired. Agreed to walk during night shift

## 2019-05-28 NOTE — Progress Notes (Signed)
PROGRESS NOTE  Emily Hoover [id number] DOB: Aug 02, 1945 DOA: 05/18/2019  PCP: Charlott Rakes, MD  Brief History/Interval Summary:  Emily Hoover is a 73 y.o. year old female with medical history significant for HTN, DM, left hip infectious myositis and MRSA bacteremia in 2017 who presented on 05/18/2019 with left sided pain after fall 2 days prior to admission where she landed on her rib regio and was found to have multiple left sided rib fractures.   on admission blood glucose of greater than 400 without anion gap and pseudohyponatremia 133. Hospital course complicated by persistent pain from rib cage fractures requiring IV pain control, COVID infection without symptoms, hypertensive urgency   Reason for Visit: Left sided rib fractures.  Poorly controlled pain.  COVID-19 infection  Consultants: None  Procedures: None  Antibiotics: Anti-infectives (From admission, onward)   None      Subjective/Interval History: Interpreter services were utilized to talk to the patient.  Reports her rib cage pain continues to improve, she had a good night sleep.  Assessment/Plan:  Multiple left-sided rib fractures with persistent pain with minimal movement Patient seems to be stable.  Not hypoxic.  Remains on scheduled acetaminophen and tramadol along with Robaxin.  Dose of Robaxin was decreased yesterday.  PT and OT.  Incentive spirometry.  She was seen by trauma surgery at [facility] and was cleared by them.  Overall patient is stable.  Her pain has significantly improved, she has no oxygen requirement, she was educated about incentive spirometry, and she has been using it as instructed .  Bilateral hip pain left more than right Despite using interpreter it was very difficult to obtain history from her.  After multiple lines of questioning I was able to determine that she has had this hip pain for a few months now.  She usually does not require any assistance at home with walking.  She had a CT  scan of abdomen pelvis which did not reveal any fractures in her joints.  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  Because of this history MRI was ordered while she was at [facility]  This was attempted on 11/14 but patient was not able to cooperate.  However her CK level has been normal.  ESR and CRP were elevated which could be from Covid.  With normal CK level that possibility of significant myositis is low.  No fractures identified on imaging studies.  Hold off on MRI for now.  Her symptoms have improved.  She has been able to ambulate.   Unwitnessed fall It remains unclear if she had any kind of syncopal episode.  History has been limited.  PT and OT evaluation.  It appears that she may need skilled nursing facility for short-term rehab.    COVID-19 infection She remains asymptomatic.  Saturating normal on room air.  She is afebrile.  Continue vitamin C and zinc.  No indication for steroids or remdesivir.  CRP improved to 2.2.  Constipation Significant stool burden noted on imaging studies.  She is on bowel regimen.  She has had bowel movements.  TSH normal at 2.56.    Diabetes mellitus type 2, uncontrolled with intermittent hypoglycemic episodes HbA1c 11.7. Due to hypoglycemia in the hospital Lantus was discontinued.  Holding her metformin.  No further episodes of hypoglycemia.  Continue to monitor.    Essential hypertension Continue lisinopril and amlodipine.  Blood pressures noted to be elevated  at times.  Continue to monitor for now.  Pain could be contributing.  History of hyperlipidemia Continue statin.  Normocytic anemia Hemoglobin is stable.  No evidence of overt loss.  Hyponatremia Stable.  DVT Prophylaxis: Lovenox Code Status: Full code Family Communication: Disussed with patient via video interpreter, tried to call son and daughter phone number, went to voicemail Disposition Plan:  PT and OT has recommended skilled nursing facility.  But patient has been more ambulatory since, LIKELY she will be able to go home with home health.   Medications:  Scheduled: . acetaminophen  650 mg Oral TID  . amLODipine  10 mg Oral Daily  . atorvastatin  20 mg Oral Daily  . enoxaparin (LOVENOX) injection  40 mg Subcutaneous Daily  . insulin aspart  0-9 Units Subcutaneous TID AC  . lisinopril  5 mg Oral Daily  . methocarbamol  750 mg Oral TID  . polyethylene glycol  17 g Oral BID  . senna-docusate  2 tablet Oral BID  . sodium chloride flush  3 mL Intravenous Q12H  . traMADol  50 mg Oral TID  . traMADol  50 mg Oral Once  . vitamin C  500 mg Oral Daily  . zinc sulfate  220 mg Oral Daily   Continuous:  MGN:OIBBCWUGQ, guaiFENesin-dextromethorphan, labetalol, morphine injection, ondansetron **OR** ondansetron (ZOFRAN) IV   Objective:  Vital Signs  Vitals:   05/27/19 2030 05/28/19 0802 05/28/19 1041 05/28/19 1534  BP: (!) 147/71 (!) 144/77 133/75 (!) 116/59  Pulse: 78 81  88  Resp: 19 13  15   Temp:  98.5 F (36.9 C)  98.4 F (36.9 C)  TempSrc:  Oral  Oral  SpO2: 96% 93%  97%  Weight:        Intake/Output Summary (Last 24 hours) at 05/28/2019 1535 Last data filed at 05/28/2019 0803 Gross per 24 hour  Intake 736 ml  Output 0 ml  Net 736 ml   Filed Weights   05/20/19 0117 05/23/19 2149  Weight: 43 kg 42.8 kg    Awake Alert, Oriented X 3, No new F.N deficits, Normal affect, small frame, but pleasant and appears comfortable Symmetrical Chest wall movement, Good air movement bilaterally, CTAB RRR,No Gallops,Rubs or new Murmurs, No Parasternal Heave +ve B.Sounds, Abd Soft, No tenderness, No rebound - guarding or rigidity. No Cyanosis, Clubbing or edema, No new Rash or bruise        Lab Results:  Data Reviewed: I have personally reviewed following labs and imaging studies  CBC: Recent Labs  Lab 05/22/19 1619 05/23/19 0437 05/25/19 0435  WBC 8.4 6.6 5.3   HGB 10.4* 10.7* 11.2*  HCT 31.1* 32.0* 33.8*  MCV 88.6 87.9 89.7  PLT 336 370 444*    Basic Metabolic Panel: Recent Labs  Lab 05/22/19 0406 05/23/19 0437 05/25/19 0435 05/26/19 0435  NA 134* 132* 136 134*  K 3.7 3.8 3.7 3.9  CL 98 96* 102 101  CO2 27 25 25 25   GLUCOSE 63* 62* 68* 120*  BUN 23 22 15 16   CREATININE 0.57 0.54 0.69 0.50  CALCIUM 8.8* 8.6* 8.5* 8.3*  MG 2.0 2.1  --   --   PHOS 4.0 3.6  --   --     GFR: CrCl cannot be calculated (Unknown ideal weight.).  Liver Function Tests: Recent Labs  Lab 05/22/19 0406 05/23/19 0437 05/25/19 0435  AST 16 17 53*  ALT 12 11 33  ALKPHOS 79 81 109  BILITOT 0.8 0.9 0.8  PROT  6.3* 6.3* 6.6  ALBUMIN 2.5* 2.4* 2.8*    Cardiac Enzymes: Recent Labs  Lab 05/23/19 0437 05/25/19 0435  CKTOTAL 14* 25*     CBG: Recent Labs  Lab 05/27/19 0758 05/27/19 1147 05/27/19 1550 05/27/19 2006 05/28/19 0726  GLUCAP 116* 205* 169* 179* 200*     Recent Results (from the past 240 hour(s))  SARS CORONAVIRUS 2 (TAT 6-24 HRS) Nasopharyngeal Nasopharyngeal Swab     Status: Abnormal   Collection Time: 05/19/19 11:50 AM   Specimen: Nasopharyngeal Swab  Result Value Ref Range Status   SARS Coronavirus 2 POSITIVE (A) NEGATIVE Final    Comment: RESULT CALLED TO, READ BACK BY AND VERIFIED WITH: C BAIN,RN 1819 05/19/2019 D BRADLEY (NOTE) SARS-CoV-2 target nucleic acids are DETECTED. The SARS-CoV-2 RNA is generally detectable in upper and lower respiratory specimens during the acute phase of infection. Positive results are indicative of active infection with SARS-CoV-2. Clinical  correlation with patient history and other diagnostic information is necessary to determine patient infection status. Positive results do  not rule out bacterial infection or co-infection with other viruses. The expected result is Negative. Fact Sheet for Patients: SugarRoll.be Fact Sheet for Healthcare Providers:  https://www.woods-mathews.com/ This test is not yet approved or cleared by the [place] FDA and  has been authorized for detection and/or diagnosis of SARS-CoV-2 by FDA under an Emergency Use Authorization (EUA). This EUA will remain  in effect (meaning this test can be used) for t he duration of the COVID-19 declaration under Section 564(b)(1) of the Act, 21 U.S.C. section 360bbb-3(b)(1), unless the authorization is terminated or revoked sooner. Performed at [facility], [place] [address], [place], [place] [address]       Radiology Studies: No results found.     LOS: 8 days   Phillips Climes MD  [facility] Pager on [email or web address]  05/28/2019, 3:35 PM

## 2019-05-28 NOTE — Progress Notes (Signed)
Occupational Therapy Treatment Patient Details Name: Emily Hoover MRN: [id number] DOB: 05/08/1946 Today's Date: 05/28/2019    History of present illness 73 y.o. female with medical history significant of hypertension, hyperlipidemia, and uncontrolled diabetes mellitus type 2.  She presented last night with complaints of left-sided pain after having a fall 05/18/19. Due to difficulty with translation cause of fall unclear, chart reveals hx vertigo/syncope. Presented to ED 05/19/19, chest xray revealed fx of L lateral ribs 5-7 and L posterior ribs 10-11. Also found to have BP 194/90 EKG reveals first degree heart block. COVID (+).    OT comments  [employer] Interpreter used during session. Making steady progress. Pt able to ambulate to sink to complete ADL then ambulate in room @ 90 ft with S using RA. Pt does not use RW at baseline and was able to ambulate without use of RW with minguard A. Discussed with CM, who states family can assist at DC. Feel pt appropriate to DC home to familiar environment. Recommend use of 3 in1 (for bedside use and to use as shower chair) and RW for safe DC home. SpO2 95 throughout session on RA. No DOE. MD notified of recommendation changes. Nsg asked to ambulate pt throughout the day to continue to progress toward DC home. Will continue to follow.   Follow Up Recommendations  Supervision/Assistance - 24 hour    Equipment Recommendations  3 in 1 bedside commode;Other (comment)(RW)    Recommendations for Other Services PT consult    Precautions / Restrictions Precautions Precautions: Fall       Mobility Bed Mobility               General bed mobility comments: OOB in chair  Transfers Overall transfer level: Needs assistance Equipment used: Rolling walker (2 wheeled) Transfers: Sit to/from Stand Sit to Stand: Supervision              Balance     Sitting balance-Leahy Scale: Good       Standing balance-Leahy Scale: Fair                              ADL either performed or assessed with clinical judgement   ADL Overall ADL's : Needs assistance/impaired     Grooming: Set up;Standing   Upper Body Bathing: Set up;Standing   Lower Body Bathing: Minimal assistance;Sit to/from stand   Upper Body Dressing : Set up;Sitting   Lower Body Dressing: Minimal assistance;Sit to/from stand   Toilet Transfer: Min guard;RW;Ambulation;Comfort height toilet           Functional mobility during ADLs: Min guard;Rolling walker;Cueing for safety General ADL Comments: Able to ambulate to bathroom to complete grooming at sink and toilet transfer with min guard A.      Vision       Perception     Praxis      Cognition Arousal/Alertness: Awake/alert Behavior During Therapy: WFL for tasks assessed/performed Overall Cognitive Status: No family/caregiver present to determine baseline cognitive functioning                                 General Comments: not oriented to year "1999" or month "October"; following commands        Exercises     Shoulder Instructions       General Comments Discussed DC recommendaitons with CM    Pertinent Vitals/ Pain  Pain Assessment: Faces Faces Pain Scale: Hurts a little bit Pain Location: L hip Pain Descriptors / Indicators: Discomfort;Grimacing Pain Intervention(s): Limited activity within patient's tolerance  Home Living                                          Prior Functioning/Environment              Frequency  Min 3X/week        Progress Toward Goals  OT Goals(current goals can now be found in the care plan section)  Progress towards OT goals: Progressing toward goals  Acute Rehab OT Goals Patient Stated Goal: to go home OT Goal Formulation: With patient Time For Goal Achievement: 06/03/19 Potential to Achieve Goals: Good ADL Goals Pt Will Perform Grooming: with set-up;with supervision;standing Pt Will Perform  Lower Body Dressing: with min guard assist;sit to/from stand Pt Will Transfer to Toilet: with min guard assist;bedside commode;ambulating Pt Will Perform Toileting - Clothing Manipulation and hygiene: sit to/from stand;sitting/lateral leans;with supervision;with set-up Pt Will Perform Tub/Shower Transfer: Tub transfer;ambulating;3 in 1;with min guard assist Additional ADL Goal #1: Pt will perform bed mobility using log roll technique with Supervision in preparation for ADLs  Plan Discharge plan needs to be updated    Co-evaluation                 AM-PAC OT "6 Clicks" Daily Activity     Outcome Measure   Help from another person eating meals?: None Help from another person taking care of personal grooming?: A Little Help from another person toileting, which includes using toliet, bedpan, or urinal?: A Little Help from another person bathing (including washing, rinsing, drying)?: A Little Help from another person to put on and taking off regular upper body clothing?: A Little Help from another person to put on and taking off regular lower body clothing?: A Little 6 Click Score: 19    End of Session Equipment Utilized During Treatment: Rolling walker  OT Visit Diagnosis: Unsteadiness on feet (R26.81);Muscle weakness (generalized) (M62.81);Pain;Other symptoms and signs involving cognitive function Pain - Right/Left: Left Pain - part of body: Hip   Activity Tolerance Patient tolerated treatment well   Patient Left in chair;with call bell/phone within reach;with chair alarm set   Nurse Communication Other (comment);Mobility status(pt needs to ambulate)        Time: 1427(1427)-1505 OT Time Calculation (min): 38 min  Charges: OT General Charges $OT Visit: 1 Visit OT Treatments $Self Care/Home Management : 23-37 mins $Therapeutic Activity: 8-22 mins  Maurie Boettcher, OT/L   Acute OT Clinical Specialist Acute Rehabilitation Services Pager [phone] Office  [phone]    [facility] 05/28/2019, 3:45 PM

## 2019-05-29 DIAGNOSIS — Z515 Encounter for palliative care: Secondary | ICD-10-CM

## 2019-05-29 DIAGNOSIS — E119 Type 2 diabetes mellitus without complications: Secondary | ICD-10-CM

## 2019-05-29 LAB — GLUCOSE, CAPILLARY
Glucose-Capillary: 163 mg/dL — ABNORMAL HIGH (ref 70–99)
Glucose-Capillary: 147 mg/dL — ABNORMAL HIGH (ref 70–99)
Glucose-Capillary: 196 mg/dL — ABNORMAL HIGH (ref 70–99)
Glucose-Capillary: 302 mg/dL — ABNORMAL HIGH (ref 70–99)

## 2019-05-29 MED ORDER — INSULIN GLARGINE 100 UNIT/ML ~~LOC~~ SOLN: 10.0000 [IU] | Freq: Every day | SUBCUTANEOUS | 0 refills | Status: DC

## 2019-05-29 MED ORDER — TRAMADOL HCL 50 MG PO TABS
50.0000 mg | ORAL_TABLET | Freq: Two times a day (BID) | ORAL | Status: DC | PRN
  Administered 2019-05-29: 21:00:00 50 mg via ORAL
  Filled 2019-05-29: qty 1

## 2019-05-29 MED ORDER — TRAMADOL HCL 50 MG PO TABS: 50.0000 mg | ORAL_TABLET | Freq: Three times a day (TID) | ORAL | 0 refills | Status: DC | PRN

## 2019-05-29 MED ORDER — ACETAMINOPHEN 325 MG PO TABS: 650.0000 mg | ORAL_TABLET | Freq: Four times a day (QID) | ORAL | Status: AC | PRN

## 2019-05-29 NOTE — TOC Transition Note (Signed)
Transition of Care [facility]) - CM/SW Discharge Note   Patient Details  Name: Emily Hoover MRN: [id number] Date of Birth: [birth year]  [facility] CM/SW Contact:  Ninfa Meeker, RN Phone Number: [phone] (working remotely) 05/29/2019, 12:32 PM   Clinical Narrative:   Patient will discharge home with family. Case manager has requested RW and 3in1 be delivered to bedside by [facility].Patient's son Brigitte Pulse will be contacted to take mom home. Case manager working on possible charity [place] for patient. [place].     Final next level of care: Home/Self Care Barriers to Discharge: No Barriers Identified   Patient Goals and CMS Choice Patient states their goals for this hospitalization and ongoing recovery are:: per son: to get better      Discharge Placement                       Discharge Plan and Services   Discharge Planning Services: CM Consult            DME Arranged: 3-N-1, Walker rolling DME Agency: [place] Agency: NA        Social Determinants of Health (SDOH) Interventions     Readmission Risk Interventions No flowsheet data found.

## 2019-05-29 NOTE — Discharge Instructions (Signed)
Person Under Monitoring Name: Emily Hoover  Location: [address] [place] [id number]   Infection Prevention Recommendations for Individuals Confirmed to have, or Being Evaluated for, 2019 Novel Coronavirus (COVID-19) Infection Who Receive Care at Home  Individuals who are confirmed to have, or are being evaluated for, COVID-19 should follow the prevention steps below until a healthcare provider or local or state health department says they can return to normal activities.  Stay home except to get medical care You should restrict activities outside your home, except for getting medical care. Do not go to work, school, or public areas, and do not use public transportation or taxis.  Call ahead before visiting your doctor Before your medical appointment, call the healthcare provider and tell them that you have, or are being evaluated for, COVID-19 infection. This will help the healthcare providers office take steps to keep other people from getting infected. Ask your healthcare provider to call the local or state health department.  Monitor your symptoms Seek prompt medical attention if your illness is worsening (e.g., difficulty breathing). Before going to your medical appointment, call the healthcare provider and tell them that you have, or are being evaluated for, COVID-19 infection. Ask your healthcare provider to call the local or state health department.  Wear a facemask You should wear a facemask that covers your nose and mouth when you are in the same room with other people and when you visit a healthcare provider. People who live with or visit you should also wear a facemask while they are in the same room with you.  Separate yourself from other people in your home As much as possible, you should stay in a different room from other people in your home. Also, you should use a separate bathroom, if available.  Avoid sharing household items You should not  share dishes, drinking glasses, cups, eating utensils, towels, bedding, or other items with other people in your home. After using these items, you should wash them thoroughly with soap and water.  Cover your coughs and sneezes Cover your mouth and nose with a tissue when you cough or sneeze, or you can cough or sneeze into your sleeve. Throw used tissues in a lined trash can, and immediately wash your hands with soap and water for at least 20 seconds or use an alcohol-based hand rub.  Wash your [facility] your hands often and thoroughly with soap and water for at least 20 seconds. You can use an alcohol-based hand sanitizer if soap and water are not available and if your hands are not visibly dirty. Avoid touching your eyes, nose, and mouth with unwashed hands.   Prevention Steps for Caregivers and Household Members of Individuals Confirmed to have, or Being Evaluated for, COVID-19 Infection Being Cared for in the Home  If you live with, or provide care at home for, a person confirmed to have, or being evaluated for, COVID-19 infection please follow these guidelines to prevent infection:  Follow healthcare providers instructions Make sure that you understand and can help the patient follow any healthcare provider instructions for all care.  Provide for the patients basic needs You should help the patient with basic needs in the home and provide support for getting groceries, prescriptions, and other personal needs.  Monitor the patients symptoms If they are getting sicker, call his or her medical provider and tell them that the patient has, or is being evaluated for, COVID-19 infection. This will help the healthcare  providers office take steps to keep other people from getting infected. Ask the healthcare provider to call the local or state health department.  Limit the number of people who have contact with the patient  If possible, have only one caregiver for the  patient.  Other household members should stay in another home or place of residence. If this is not possible, they should stay  in another room, or be separated from the patient as much as possible. Use a separate bathroom, if available.  Restrict visitors who do not have an essential need to be in the home.  Keep older adults, very young children, and other sick people away from the patient Keep older adults, very young children, and those who have compromised immune systems or chronic health conditions away from the patient. This includes people with chronic heart, lung, or kidney conditions, diabetes, and cancer.  Ensure good ventilation Make sure that shared spaces in the home have good air flow, such as from an air conditioner or an opened window, weather permitting.  Wash your hands often  Wash your hands often and thoroughly with soap and water for at least 20 seconds. You can use an alcohol based hand sanitizer if soap and water are not available and if your hands are not visibly dirty.  Avoid touching your eyes, nose, and mouth with unwashed hands.  Use disposable paper towels to dry your hands. If not available, use dedicated cloth towels and replace them when they become wet.  Wear a facemask and gloves  Wear a disposable facemask at all times in the room and gloves when you touch or have contact with the patients blood, body fluids, and/or secretions or excretions, such as sweat, saliva, sputum, nasal mucus, vomit, urine, or feces.  Ensure the mask fits over your nose and mouth tightly, and do not touch it during use.  Throw out disposable facemasks and gloves after using them. Do not reuse.  Wash your hands immediately after removing your facemask and gloves.  If your personal clothing becomes contaminated, carefully remove clothing and launder. Wash your hands after handling contaminated clothing.  Place all used disposable facemasks, gloves, and other waste in a lined  container before disposing them with other household waste.  Remove gloves and wash your hands immediately after handling these items.  Do not share dishes, glasses, or other household items with the patient  Avoid sharing household items. You should not share dishes, drinking glasses, cups, eating utensils, towels, bedding, or other items with a patient who is confirmed to have, or being evaluated for, COVID-19 infection.  After the person uses these items, you should wash them thoroughly with soap and water.  Wash laundry thoroughly  Immediately remove and wash clothes or bedding that have blood, body fluids, and/or secretions or excretions, such as sweat, saliva, sputum, nasal mucus, vomit, urine, or feces, on them.  Wear gloves when handling laundry from the patient.  Read and follow directions on labels of laundry or clothing items and detergent. In general, wash and dry with the warmest temperatures recommended on the label.  Clean all areas the individual has used often  Clean all touchable surfaces, such as counters, tabletops, doorknobs, bathroom fixtures, toilets, phones, keyboards, tablets, and bedside tables, every day. Also, clean any surfaces that may have blood, body fluids, and/or secretions or excretions on them.  Wear gloves when cleaning surfaces the patient has come in contact with.  Use a diluted bleach solution (e.g., dilute bleach with  1 part bleach and 10 parts water) or a household disinfectant with a label that says EPA-registered for coronaviruses. To make a bleach solution at home, add 1 tablespoon of bleach to 1 quart (4 cups) of water. For a larger supply, add  cup of bleach to 1 gallon (16 cups) of water.  Read labels of cleaning products and follow recommendations provided on product labels. Labels contain instructions for safe and effective use of the cleaning product including precautions you should take when applying the product, such as wearing gloves or  eye protection and making sure you have good ventilation during use of the product.  Remove gloves and wash hands immediately after cleaning.  Monitor yourself for signs and symptoms of illness Caregivers and household members are considered close contacts, should monitor their health, and will be asked to limit movement outside of the home to the extent possible. Follow the monitoring steps for close contacts listed on the symptom monitoring form.   ? If you have additional questions, contact your local health department or call the epidemiologist on call at [phone] (available 24/7). ? This guidance is subject to change. For the most up-to-date guidance from [facility], please refer to their website: [email or web address]

## 2019-05-29 NOTE — Discharge Summary (Signed)
Emily Hoover, is a 73 y.o. female  DOB 02-17-1946  MRN [id number].  Admission date:  05/18/2019  Admitting Physician  Norval Morton, MD  Discharge Date:  05/29/2019   Primary MD  Charlott Rakes, MD  Recommendations for primary care physician for things to follow:  -Please check CBC, CMP during next visit -Please adjust Lantus dose as needed   Admission Diagnosis  Fall [W19.XXXA] Hyperglycemia [R73.9] Fall, initial encounter [W19.XXXA] Closed fracture of left inferior pubic ramus, initial encounter ([place]) [S32.592A] Multiple fractures of ribs, left side, initial encounter for closed fracture [S22.42XA] Multiple rib fractures [S22.49XA]   Discharge Diagnosis  Fall [W19.XXXA] Hyperglycemia [R73.9] Fall, initial encounter [W19.XXXA] Closed fracture of left inferior pubic ramus, initial encounter ([place]) [S32.592A] Multiple fractures of ribs, left side, initial encounter for closed fracture [S22.42XA] Multiple rib fractures [S22.49XA]    Principal Problem:   Multiple rib fractures Active Problems:   Hip pain   Hypercholesteremia   Fall at home, initial encounter   Hypertensive urgency   Uncontrolled type 2 diabetes mellitus with hyperglycemia ([place])   COVID-19 virus infection   Constipation   Pain in the abdomen   Hospice care patient   Palliative care patient      Past Medical History:  Diagnosis Date   Diabetes mellitus    Diabetes mellitus without complication ([place])    Hypercholesteremia    Hypertension    Renal insufficiency     Past Surgical History:  Procedure Laterality Date   TUBAL LIGATION         History of present illness and  Hospital Course:     Kindly see H&P for history of present illness and admission details, please review complete Labs, Consult reports and Test reports for all details in brief  HPI  from the history and physical done on the day of  admission 05/19/2019   HPI: Emily Hoover is a 72 y.o. female with medical history significant of hypertension, hyperlipidemia, and uncontrolled diabetes mellitus type 2.  She presented last night with complaints of left-sided pain after having a fall the previous day.  History is obtained with the use of interpreter services, but limited due patient confusion with the translator.  She reportedly was walking back to her house when she felt herself falling towards the left side.  Patient ambulates without use of any assistive devices.  Denied any trauma to her head, loss of consciousness, tripping over anything to cause the fall, chest pain, palpitations, lightheadedness, nausea, vomiting, or diarrhea symptoms. After the fall she reported having pain on the left side of her ribs with difficulty taking a deep breath in.  She had not tried anything specifically to help with pain symptoms.  Patient reports that her doctor just had started her on new medication that she cannot recall the name of for her blood sugars.  ED Course: Upon admission to the emergency department patient was noted to have O2 saturations maintained on room air, but blood pressure elevated up to 194/90.  Labs significant for  sodium 133, BUN 21, creatinine 0.78, and glucose 413.  x-rays revealed fractures of the left lateral ribs 5- 7 and left posterior ribs 10-11.  No other acute abnormalities or fractures were noted on any of the other imaging studies.  Trauma was consulted, but recommended medicine admission due to patient's elevated blood sugar.   [place] a 73 y.o.year old femalewith medical history significant for HTN, DM, left hip infectiousmyositisand MRSA bacteremia in 2017who presented on 11/9/2020with left sided pain after fall 2 days prior to admission where she landed on her rib regio and was found to have multiple left sided rib fractures. on admission blood glucose of greater than 400  without anion gap and pseudohyponatremia 133. Hospital course complicated by persistent pain from rib cage fractures requiring IV pain control, COVID infection without symptoms, hypertensive urgency  Multiple left-sided rib fractures  Patient seems to be stable.  Not hypoxic.  Was kept on scheduled Tylenol and tramadol along with Robaxin during hospital stay, her pain has significantly subsided, she has been compliant with incentive spirometry, there was no hypoxia during hospital stay, she was seen by PT/OT, initially recommended for SNF, but currently she has improved with recommendation for discharge home with family supervision.  Bilateral hip pain left more than right Despite using interpreter it was very difficult to obtain history from her.  After multiple lines of questioning I was able to determine that she has had this hip pain for a few months now.  She usually does not require any assistance at home with walking.  She had a CT scan of abdomen pelvis which did not reveal any fractures in her joints.  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  Because of this history MRI was ordered while she was at [facility].  This was attempted on 11/14 but patient was not able to cooperate.  However her CK level has been normal.  ESR and CRP were elevated which could be from Covid.  With normal CK level that possibility of significant myositis is low.  No fractures identified on imaging studies.  Hold off on MRI for now.  Her symptoms have improved.  She has been able to ambulate.   Unwitnessed fall It remains unclear if she had any kind of syncopal episode.  History has been limited.  There was no evidence of syncope or near syncope or loss of consciousness during her 9-day hospitalization.  COVID-19 infection She remains asymptomatic.  Saturating normal on room air.  She is afebrile.  Treated with zinc and vitamin  C during hospital stay, there was no indication for steroids or remdesivir, she remains symptomatic  Constipation Significant stool burden noted on imaging studies.  She is on bowel regimen.  She has had bowel movements.  TSH normal at 2.56.    Diabetes mellitus type 2, uncontrolled with intermittent hypoglycemic episodes HbA1c 11.7. Due to hypoglycemia in the hospital Lantus was discontinued.  Holding her metformin.  No further episodes of hypoglycemia.    She will be discharged on lower dose Lantus, and to resume her home dose Metformin  Essential hypertension Continue lisinopril and amlodipine.Marland Kitchen  History of hyperlipidemia Continue statin.  Normocytic anemia Hemoglobin is stable.  No evidence of overt loss.  Hyponatremia Stable.   Discharge Condition:  Stable   Follow UP  Follow-up Information    Charlott Rakes, MD Follow up in 1 week(s).  Specialty: Family Medicine Contact information: [address] [place] [phone]             Discharge Instructions  and  Discharge Medications     Discharge Instructions    Discharge instructions   Complete by: As directed    Follow with Primary MD Charlott Rakes, MD in 7 days   Get CBC, CMP, 2 view Chest X ray checked  by Primary MD next visit.    Activity: As tolerated with Full fall precautions use walker/cane & assistance as needed   Disposition Home    Diet: Heart Healthy /Carb modofoed , with feeding assistance and aspiration precautions.  For Heart failure patients - Check your Weight same time everyday, if you gain over 2 pounds, or you develop in leg swelling, experience more shortness of breath or chest pain, call your Primary MD immediately. Follow Cardiac Low Salt Diet and 1.5 lit/day fluid restriction.   On your next visit with your primary care physician please Get Medicines reviewed and adjusted.   Please request your Prim.MD to go over all Hospital Tests and  Procedure/Radiological results at the follow up, please get all Hospital records sent to your Prim MD by signing hospital release before you go home.   If you experience worsening of your admission symptoms, develop shortness of breath, life threatening emergency, suicidal or homicidal thoughts you must seek medical attention immediately by calling 911 or calling your MD immediately  if symptoms less severe.  You Must read complete instructions/literature along with all the possible adverse reactions/side effects for all the Medicines you take and that have been prescribed to you. Take any new Medicines after you have completely understood and accpet all the possible adverse reactions/side effects.   Do not drive, operating heavy machinery, perform activities at heights, swimming or participation in water activities or provide baby sitting services if your were admitted for syncope or siezures until you have seen by Primary MD or a Neurologist and advised to do so again.  Do not drive when taking Pain medications.    Do not take more than prescribed Pain, Sleep and Anxiety Medications  Special Instructions: If you have smoked or chewed Tobacco  in the last 2 yrs please stop smoking, stop any regular Alcohol  and or any Recreational drug use.  Wear Seat belts while driving.   Please note  You were cared for by a hospitalist during your hospital stay. If you have any questions about your discharge medications or the care you received while you were in the hospital after you are discharged, you can call the unit and asked to speak with the hospitalist on call if the hospitalist that took care of you is not available. Once you are discharged, your primary care physician will handle any further medical issues. Please note that NO REFILLS for any discharge medications will be authorized once you are discharged, as it is imperative that you return to your primary care physician (or establish a  relationship with a primary care physician if you do not have one) for your aftercare needs so that they can reassess your need for medications and monitor your lab values.   Increase activity slowly   Complete by: As directed    MyChart COVID-19 home monitoring program   Complete by: May 29, 2019    Is the patient willing to use the [place] for home monitoring?: Yes   Temperature monitoring   Complete by: May 29, 2019  After how many days would you like to receive a notification of this patient's flowsheet entries?: 1     Allergies as of 05/29/2019   No Known Allergies     Medication List    TAKE these medications   acetaminophen 325 MG tablet Commonly known as: TYLENOL Take 2 tablets (650 mg total) by mouth every 6 (six) hours as needed for mild pain or moderate pain.   amLODipine 10 MG tablet Commonly known as: NORVASC Take 1 tablet (10 mg total) by mouth daily.   atorvastatin 20 MG tablet Commonly known as: LIPITOR Take 1 tablet (20 mg total) by mouth daily.   benzonatate 100 MG capsule Commonly known as: TESSALON Take 1 capsule (100 mg total) by mouth every 8 (eight) hours.   glucose blood test strip Commonly known as: True Metrix Blood Glucose Test 1 each by Other route 3 (three) times daily.   insulin glargine 100 UNIT/ML injection Commonly known as: LANTUS Inject 0.1 mLs (10 Units total) into the skin at bedtime. What changed: how much to take   INSULIN SYRINGE 1CC/30GX5/16" 30G X 5/16" 1 ML Misc Check blood sugar TID & QHS   lisinopril 5 MG tablet Commonly known as: ZESTRIL Take 1 tablet (5 mg total) by mouth daily.   metFORMIN 500 MG tablet Commonly known as: GLUCOPHAGE Take 2 tablets (1,000 mg total) by mouth 2 (two) times daily with a meal.   traMADol 50 MG tablet Commonly known as: ULTRAM Take 1 tablet (50 mg total) by mouth every 8 (eight) hours as needed for severe pain.   True Metrix Meter Devi 1 each by Does not apply route 3  (three) times daily.   TRUEplus Lancets 28G Misc 1 each by Does not apply route 3 (three) times daily.         Diet and Activity recommendation: See Discharge Instructions above   Consults obtained -  None   Major procedures and Radiology Reports - PLEASE review detailed and final reports for all details, in brief -      Dg Ribs Unilateral W/chest Left  Result Date: 05/18/2019 CLINICAL DATA:  [place] yesterday.  Left-sided pain. EXAM: LEFT RIBS AND CHEST - 3+ VIEW COMPARISON:  10/06/2018 FINDINGS: Multiple lower left rib fractures. Fracture of the lateral ribs number 5, 6 and 7. Fracture of the posterior ribs numbers 10 and 11. No evidence of pneumothorax or hemothorax. IMPRESSION: Multiple left rib fractures. Lateral fractures numbers 5 through 7. Posterior fractures numbers 10 and 11. Electronically Signed   By: Nelson Chimes M.D.   On: 05/18/2019 21:48   Dg Pelvis 1-2 Views  Result Date: 05/18/2019 CLINICAL DATA:  [place] yesterday. Pain. EXAM: PELVIS - 1-2 VIEW COMPARISON:  None. FINDINGS: Question inferior ramus fracture on the left. No other pelvic ring disruption identified or suspected. IMPRESSION: Suspicion of inferior ramus fracture on the left. Electronically Signed   By: Nelson Chimes M.D.   On: 05/18/2019 21:44   Ct Head Wo Contrast  Result Date: 05/19/2019 CLINICAL DATA:  Pain after fall EXAM: CT HEAD WITHOUT CONTRAST TECHNIQUE: Contiguous axial images were obtained from the base of the skull through the vertex without intravenous contrast. COMPARISON:  01/19/2011 FINDINGS: Brain: No evidence of acute infarction, hemorrhage, hydrocephalus, extra-axial collection or mass lesion/mass effect. Vascular: Mild atherosclerotic calcifications involving the large vessels of the skull base. No unexpected hyperdense vessel. Skull: Normal. Negative for fracture or focal lesion. Sinuses/Orbits: Unchanged left ethmoid osteoma. Paranasal sinuses and mastoid air cells are  clear. Orbital  structures unremarkable. Other: None. IMPRESSION: No acute intracranial abnormality. Electronically Signed   By: Davina Poke M.D.   On: 05/19/2019 09:04   Ct Chest W Contrast  Result Date: 05/19/2019 CLINICAL DATA:  [place] yesterday. Left-sided abdominal and flank pain. EXAM: CT CHEST, ABDOMEN, AND PELVIS WITH CONTRAST TECHNIQUE: Multidetector CT imaging of the chest, abdomen and pelvis was performed following the standard protocol during bolus administration of intravenous contrast. CONTRAST:  137m OMNIPAQUE IOHEXOL 300 MG/ML  SOLN COMPARISON:  CT scan 09/16/2015 FINDINGS: CT CHEST FINDINGS Cardiovascular: The heart is normal in size. No pericardial effusion. The aorta is normal in caliber. There is moderate tortuosity and atherosclerotic calcification. No dissection. The branch vessels are patent. Scattered atherosclerotic calcifications. Scattered coronary artery calcifications are also noted. The pulmonary arteries appear normal. Mediastinum/Nodes: No mediastinal or hilar mass or adenopathy or hematoma. Lungs/Pleura: No acute pulmonary findings. No pulmonary contusion, pleural effusion or pneumothorax. Dependent bibasilar atelectasis is noted. There are underlying emphysematous changes and pulmonary scarring. No worrisome pulmonary lesions. Musculoskeletal: No breast masses, supraclavicular or axillary adenopathy. The bony thorax is intact. No definite sternal fractures. The thoracic vertebral bodies are normally aligned. No acute fracture. There are left lower rib fractures. There is a nondisplaced eighth lateral rib fracture and nondisplaced eleventh and twelfth posterior rib fractures. CT ABDOMEN PELVIS FINDINGS Hepatobiliary: No focal hepatic lesions or acute hepatic injury. No perihepatic fluid collections. The portal and hepatic veins are patent. The gallbladder is grossly normal. No common bile duct dilatation. Pancreas: No mass, inflammation or ductal dilatation. Moderate atrophy of the  pancreatic body and tail regions. No acute pancreatic injury or peripancreatic fluid collection. Spleen: Normal size.  No focal lesions. Adrenals/Urinary Tract: The adrenal glands and kidneys are unremarkable. Small scattered renal cysts are noted. Mild renal cortical scarring changes. No collecting system abnormalities. The bladder appears normal. Stomach/Bowel: The stomach, duodenum and small bowel are unremarkable. No acute inflammatory changes, mass lesions or obstructive findings. There is a large amount of stool throughout the colon and down into the rectum suggesting constipation. No inflammatory changes or mass lesions. Vascular/Lymphatic: Moderate tortuosity and calcification of the abdominal aorta and iliac arteries but no aneurysm or dissection. The branch vessels are patent. The major venous structures are patent. No mesenteric or retroperitoneal mass or adenopathy. No hematoma. Reproductive: The uterus and ovaries are unremarkable. Other: No free pelvic fluid collections or pelvic hematoma. Musculoskeletal: The bony structures are intact. No acute fractures are identified. The hips are normally located. The pubic symphysis and SI joints are intact. 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 Cardiovascular: The heart is normal in size. IMPRESSION: 1. Left lower rib fractures likely accounting for the patient's left flank and side pain. No significant displacement and no associated pneumothorax or pleural hematoma. 2. Dependent bibasilar atelectasis but no worrisome pulmonary findings. 3. Normal appearance of the heart and great vessels. No mediastinal hematoma. 4. No acute intra-abdominal/intrapelvic abnormality. No free air or free fluid and the solid abdominal organs are intact. 5. Large amount of stool throughout the colon suggesting constipation. 6. Intact bony pelvis.  Electronically Signed   By: PMarijo SanesM.D.   On: 05/19/2019 13:25   Ct Pelvis Wo Contrast  Result Date: 05/21/2019 CLINICAL DATA:  Possible left inferior pubic ramus fracture seen on pelvis x-ray, not confirmed on the CT the abdomen and pelvis from  May 19, 2019. Pain. EXAM: CT PELVIS WITHOUT CONTRAST TECHNIQUE: Multidetector CT imaging of the pelvis was performed following the standard protocol without intravenous contrast. COMPARISON:  None. FINDINGS: Urinary Tract: The bladder is filled with contrast but otherwise normal. Bowel: Fecal loading seen in the colon. Visualized appendix is normal. Visualized loops of small bowel are normal. Vascular/Lymphatic: Atherosclerotic changes are seen in the aorta and iliac vessels. No adenopathy. Reproductive:  No mass or other significant abnormality Other:  None. Musculoskeletal: The iliac bones are normal. Specifically, the inferior left pubic ramus is normal with no acute fracture. Degenerative changes are seen in the pubic symphysis. The proximal femurs are intact. The acetabular intact. The SI joints and sacrum are intact. Bilateral L5 pars defects are noted with grade 1 anterolisthesis of L5 versus S1. IMPRESSION: 1. Bilateral L5 pars defects are noted with grade 1 anterolisthesis of L5 versus S1. No other fractures noted. 2. Moderate fecal loading throughout the visualized colon. 3. Atherosclerotic changes in the aorta and iliac vessels. Electronically Signed   By: Dorise Bullion III M.D   On: 05/21/2019 20:44   Ct Abdomen Pelvis W Contrast  Result Date: 05/19/2019 CLINICAL DATA:  [place] yesterday. Left-sided abdominal and flank pain. EXAM: CT CHEST, ABDOMEN, AND PELVIS WITH CONTRAST TECHNIQUE: Multidetector CT imaging of the chest, abdomen and pelvis was performed following the standard protocol during bolus administration of intravenous contrast. CONTRAST:  172m OMNIPAQUE IOHEXOL 300 MG/ML  SOLN COMPARISON:  CT scan 09/16/2015 FINDINGS: CT CHEST  FINDINGS Cardiovascular: The heart is normal in size. No pericardial effusion. The aorta is normal in caliber. There is moderate tortuosity and atherosclerotic calcification. No dissection. The branch vessels are patent. Scattered atherosclerotic calcifications. Scattered coronary artery calcifications are also noted. The pulmonary arteries appear normal. Mediastinum/Nodes: No mediastinal or hilar mass or adenopathy or hematoma. Lungs/Pleura: No acute pulmonary findings. No pulmonary contusion, pleural effusion or pneumothorax. Dependent bibasilar atelectasis is noted. There are underlying emphysematous changes and pulmonary scarring. No worrisome pulmonary lesions. Musculoskeletal: No breast masses, supraclavicular or axillary adenopathy. The bony thorax is intact. No definite sternal fractures. The thoracic vertebral bodies are normally aligned. No acute fracture. There are left lower rib fractures. There is a nondisplaced eighth lateral rib fracture and nondisplaced eleventh and twelfth posterior rib fractures. CT ABDOMEN PELVIS FINDINGS Hepatobiliary: No focal hepatic lesions or acute hepatic injury. No perihepatic fluid collections. The portal and hepatic veins are patent. The gallbladder is grossly normal. No common bile duct dilatation. Pancreas: No mass, inflammation or ductal dilatation. Moderate atrophy of the pancreatic body and tail regions. No acute pancreatic injury or peripancreatic fluid collection. Spleen: Normal size.  No focal lesions. Adrenals/Urinary Tract: The adrenal glands and kidneys are unremarkable. Small scattered renal cysts are noted. Mild renal cortical scarring changes. No collecting system abnormalities. The bladder appears normal. Stomach/Bowel: The stomach, duodenum and small bowel are unremarkable. No acute inflammatory changes, mass lesions or obstructive findings. There is a large amount of stool throughout the colon and down into the rectum suggesting constipation. No  inflammatory changes or mass lesions. Vascular/Lymphatic: Moderate tortuosity and calcification of the abdominal aorta and iliac arteries but no aneurysm or dissection. The branch vessels are patent. The major venous structures are patent. No mesenteric or retroperitoneal mass or adenopathy. No hematoma. Reproductive: The uterus and ovaries are unremarkable. Other: No free pelvic fluid collections or pelvic hematoma. Musculoskeletal: The bony structures are intact. No acute fractures are identified. The hips are normally located. The pubic symphysis and  SI joints are intact. 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 Cardiovascular: The heart is normal in size. IMPRESSION: 1. Left lower rib fractures likely accounting for the patient's left flank and side pain. No significant displacement and no associated pneumothorax or pleural hematoma. 2. Dependent bibasilar atelectasis but no worrisome pulmonary findings. 3. Normal appearance of the heart and great vessels. No mediastinal hematoma. 4. No acute intra-abdominal/intrapelvic abnormality. No free air or free fluid and the solid abdominal organs are intact. 5. Large amount of stool throughout the colon suggesting constipation. 6. Intact bony pelvis. Electronically Signed   By: Marijo Sanes M.D.   On: 05/19/2019 13:25   Dg Knee Complete 4 Views Left  Result Date: 05/18/2019 CLINICAL DATA:  [place] yesterday. Knee pain. EXAM: LEFT KNEE - COMPLETE 4+ VIEW COMPARISON:  None. FINDINGS: No evidence of fracture or joint effusion. 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 IMPRESSION: No acute or traumatic finding. 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 Electronically Signed   By: Nelson Chimes M.D.   On: 05/18/2019 21:40   Dg Femur Min 2 Views Left  Result Date: 05/18/2019 CLINICAL DATA:   [place] yesterday.  Pain. EXAM: LEFT FEMUR 2 VIEWS COMPARISON:  None. FINDINGS: There is no evidence of fracture or other focal bone lesions. Soft tissues are unremarkable. IMPRESSION: Negative. Electronically Signed   By: Nelson Chimes M.D.   On: 05/18/2019 21:41    Micro Results     No results found for this or any previous visit (from the past 240 hour(s)).     Today   Subjective:   Emily Hoover today has no headache, her total chest pain is controlled , no significant events overnight as discussed with staff .   Objective:   Blood pressure (!) 144/81, pulse 74, temperature 98.2 F (36.8 C), temperature source Oral, resp. rate 18, weight 42.8 kg, SpO2 96 %.   Intake/Output Summary (Last 24 hours) at 05/29/2019 1159 Last data filed at 05/29/2019 0732 Gross per 24 hour  Intake 770 ml  Output 0 ml  Net 770 ml    Exam Awake Alert, Oriented x 3, No new F.N deficits, Normal affect , Symmetrical Chest wall movement, Good air movement bilaterally, CTAB RRR,No Gallops,Rubs or new Murmurs, No Parasternal Heave +ve B.Sounds, Abd Soft, Non tender,  No rebound -guarding or rigidity. No Cyanosis, Clubbing or edema, No new Rash or bruise  Data Review   CBC w Diff:  Lab Results  Component Value Date   WBC 5.3 05/25/2019   HGB 11.2 (L) 05/25/2019   HCT 33.8 (L) 05/25/2019   PLT 444 (H) 05/25/2019   LYMPHOPCT 15 05/19/2019   MONOPCT 6 05/19/2019   EOSPCT 0 05/19/2019   BASOPCT 1 05/19/2019    CMP:  Lab Results  Component Value Date   NA 134 (L) 05/26/2019   NA 139 08/02/2017   K 3.9 05/26/2019   CL 101 05/26/2019   CO2 25 05/26/2019   BUN 16 05/26/2019   BUN 14 08/02/2017   CREATININE 0.50 05/26/2019   CREATININE 0.79 10/31/2015   PROT 6.6 05/25/2019   PROT 6.9 08/02/2017   ALBUMIN 2.8 (L) 05/25/2019   ALBUMIN 4.0 08/02/2017   BILITOT 0.8 05/25/2019   BILITOT 0.7 08/02/2017   ALKPHOS 109 05/25/2019   AST 53 (H) 05/25/2019   ALT 33 05/25/2019  .   Total  Time in preparing paper work, data evaluation and todays exam - 34 minutes  Phillips Climes M.D on 05/29/2019 at 11:59 AM  [facility]   Office  [phone]

## 2019-05-29 NOTE — Progress Notes (Signed)
Discharge Delayed due to when I spoe to pt son Emily Hoover at 78 he is working today and can not come pick pt up until after work but is trying to get someone else to come pick patient up. Notified Case manger Emily Miller RN. I am still waiting to hear back from the son. pt may have to stay another night until she gets a ride, as I have tries calling son back but no answer & no return call. Verbally over phone reviewed discharge AVS instructions with patient;s son Emily Hoover verbalized understanding. Copy of AVS in English and Spanish to go home with pt. As of 1836 we still have not heard back from son Emily Hoover when pt will be picked up.

## 2019-05-30 LAB — GLUCOSE, CAPILLARY: Glucose-Capillary: 181 mg/dL — ABNORMAL HIGH (ref 70–99)

## 2019-05-30 MED ORDER — PNEUMOCOCCAL VAC POLYVALENT 25 MCG/0.5ML IJ INJ
0.5000 mL | INJECTION | INTRAMUSCULAR | Status: AC
  Administered 2019-05-30: 0.5 mL via INTRAMUSCULAR
  Filled 2019-05-30 (×2): qty 0.5

## 2019-05-30 NOTE — TOC Transition Note (Signed)
Transition of Care [facility]) - CM/SW Discharge Note   Patient Details  Name: Jakerria Ratterman MRN: [id number] Date of Birth: 1945-08-26  Transition of Care [facility]) CM/SW Contact:  Ninfa Meeker, RN Phone Number: [phone] (working remotely) 05/30/2019, 8:03 AM   Clinical Narrative:   Case manager was notified that patient's family had not picked her up yesterday. CM called Brigitte Pulse and reminded him that his mom was ready for discharge on yesterday,  he stated he got home late for work and he and his sister would get her this morning. Case manager informed him that she is ready. CM notified charge nurse that family will be there shortly to transport patient home.      Final next level of care: Home/Self Care Barriers to Discharge: No Barriers Identified   Patient Goals and CMS Choice Patient states their goals for this hospitalization and ongoing recovery are:: per son: to get better      Discharge Placement                       Discharge Plan and Services   Discharge Planning Services: CM Consult            DME Arranged: 3-N-1, Walker rolling DME Agency: [facility] Arranged: PT HH Agency: [place] Date [place]: 05/29/19 Time [place]: [id number] Representative spoke with at [place]: [place] ([place]) Interventions     Readmission Risk Interventions No flowsheet data found.

## 2019-05-30 NOTE — Progress Notes (Signed)
Called son to check on pickup of patient.  No answer.  LMOVM requesting call back with arrangements to get pt home.

## 2019-05-30 NOTE — Discharge Planning (Signed)
Pt discharged to home with son. Discussed covid 19 instructions, signature obtained. Pneumonia vaccine provided and given to right deltoid. PIV removed, dressed in home clothes. Walker and portable bedside commode sent to son's transport vehicle. Discharge instructions and prescriptions given.

## 2019-06-01 ENCOUNTER — Other Ambulatory Visit: Payer: Self-pay | Admitting: Family Medicine

## 2019-06-01 DIAGNOSIS — E119 Type 2 diabetes mellitus without complications: Secondary | ICD-10-CM

## 2019-06-01 MED FILL — traMADol HCL 50 MG TABS: 50 | 5 days supply | Qty: 15 | Fill #0

## 2020-08-02 ENCOUNTER — Emergency Department (HOSPITAL_COMMUNITY)
Admission: EM | Admit: 2020-08-02 | Discharge: 2020-08-03 | Disposition: A | Discharge status: Home / Self Care | Payer: Self-pay | Attending: Emergency Medicine | Admitting: Emergency Medicine

## 2020-08-02 ENCOUNTER — Emergency Department (HOSPITAL_COMMUNITY): Payer: Self-pay

## 2020-08-02 ENCOUNTER — Other Ambulatory Visit: Payer: Self-pay

## 2020-08-02 DIAGNOSIS — Z7984 Long term (current) use of oral hypoglycemic drugs: Secondary | ICD-10-CM | POA: Insufficient documentation

## 2020-08-02 DIAGNOSIS — R52 Pain, unspecified: Secondary | ICD-10-CM

## 2020-08-02 DIAGNOSIS — S4292XA Fracture of left shoulder girdle, part unspecified, initial encounter for closed fracture: Secondary | ICD-10-CM | POA: Insufficient documentation

## 2020-08-02 DIAGNOSIS — E1165 Type 2 diabetes mellitus with hyperglycemia: Secondary | ICD-10-CM | POA: Insufficient documentation

## 2020-08-02 DIAGNOSIS — W19XXXA Unspecified fall, initial encounter: Secondary | ICD-10-CM | POA: Insufficient documentation

## 2020-08-02 DIAGNOSIS — Z794 Long term (current) use of insulin: Secondary | ICD-10-CM | POA: Insufficient documentation

## 2020-08-02 DIAGNOSIS — I10 Essential (primary) hypertension: Secondary | ICD-10-CM | POA: Insufficient documentation

## 2020-08-02 DIAGNOSIS — Z79899 Other long term (current) drug therapy: Secondary | ICD-10-CM | POA: Insufficient documentation

## 2020-08-02 MED ORDER — IBUPROFEN 400 MG PO TABS
400.0000 mg | ORAL_TABLET | Freq: Once | ORAL | Status: AC | PRN
  Administered 2020-08-02: 400 mg via ORAL
  Filled 2020-08-02: qty 1

## 2020-08-02 NOTE — ED Triage Notes (Signed)
Per family, pt began having right arm pain several days ago, unsure if she fell while alone. Pt has pain to right shoulder/upper arm and decreased ROM. +radial present

## 2020-08-02 NOTE — Discharge Instructions (Addendum)
Tylenol every 4 hours for pain 

## 2020-08-02 NOTE — ED Provider Notes (Signed)
[place] EMERGENCY DEPARTMENT Provider Note   CSN: [id number] Arrival date & time: 08/02/20  1228     History Chief Complaint  Patient presents with  . Arm Pain    Emily Hoover is a 75 y.o. female.  The history is provided by the patient. No language interpreter was used.  Arm Pain This is a new problem. The current episode started 2 days ago. The problem occurs constantly. The problem has not changed since onset.Pertinent negatives include no chest pain, no abdominal pain and no headaches. Nothing aggravates the symptoms. Nothing relieves the symptoms. She has tried nothing for the symptoms. The treatment provided no relief.  Pt fell 2 days ago and hit on her elbow.      Past Medical History:  Diagnosis Date  . Diabetes mellitus   . Diabetes mellitus without complication ([place])   . Hypercholesteremia   . Hypertension   . Renal insufficiency     Patient Active Problem List   Diagnosis Date Noted  . Hospice care patient 05/29/2019  . Palliative care patient 05/29/2019  . Constipation 05/23/2019  . Pain in the abdomen 05/23/2019  . COVID-19 virus infection 05/20/2019  . Multiple rib fractures 05/19/2019  . Fall at home, initial encounter 05/19/2019  . Hypertensive urgency 05/19/2019  . Uncontrolled type 2 diabetes mellitus with hyperglycemia ([place]) 05/19/2019  . Non-compliance 07/07/2018  . Syncope 11/18/2017  . Hypothermia 11/18/2017  . Hypercholesteremia 10/30/2017  . 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 Renal insufficiency 10/10/2015  . Staphylococcus aureus bacteremia 09/19/2015  . UTI (lower urinary tract infection) 09/16/2015  . Hip pain 09/16/2015  . Hypertension 09/16/2015  . Pain management 09/16/2015  . Vertigo 09/07/2015  . Labyrinthitis 09/07/2015  . Neck pain 09/07/2015  . Diabetes mellitus without complication ([place]) 44/09/4740  . Labyrinthitis of right ear   . Chronic neck pain   . Uncontrollable vomiting   . Diabetes ([place]) 06/30/2013     Past Surgical History:  Procedure Laterality Date  . TUBAL LIGATION       OB History    Gravida  0   Para  0   Term  0   Preterm  0   AB  0   Living        SAB  0   IAB  0   Ectopic  0   Multiple      Live Births              Family History  Family history unknown: Yes  Problem Relation Age of Onset  . Family history unknown: Yes  . CAD Mother   . CAD Father   . Hypertension Father   . Diabetes Neg Hx   . Cancer Neg Hx     Social History   Tobacco Use  . Smoking status: Never Smoker  . Smokeless tobacco: Never Used  Substance Use Topics  . Alcohol use: No  . Drug use: No    Home Medications Prior to Admission medications   Medication Sig Start Date End Date Taking? Authorizing Provider  acetaminophen (TYLENOL) 325 MG tablet Take 2 tablets (650 mg total) by mouth every 6 (six) hours as needed for mild pain or moderate pain. 05/29/19   Elgergawy, Silver Huguenin, MD  amLODipine (NORVASC) 10 MG tablet Take 1 tablet (10 mg total) by mouth daily. 04/29/19   Charlott Rakes, MD  atorvastatin (LIPITOR) 20 MG tablet Take 1 tablet (20 mg total) by mouth daily. 04/29/19   Charlott Rakes,  MD  benzonatate (TESSALON) 100 MG capsule Take 1 capsule (100 mg total) by mouth every 8 (eight) hours. Patient not taking: Reported on 05/19/2019 10/06/18   Carlisle Cater, PA-C  Blood Glucose Monitoring Suppl (TRUE METRIX METER) DEVI 1 each by Does not apply route 3 (three) times daily. 07/07/18   Charlott Rakes, MD  glucose blood (TRUE METRIX BLOOD GLUCOSE TEST) test strip 1 each by Other route 3 (three) times daily. 07/07/18   Charlott Rakes, MD  insulin glargine (LANTUS) 100 UNIT/ML injection Inject 0.1 mLs (10 Units total) into the skin at bedtime. 05/29/19   Elgergawy, Silver Huguenin, MD  Insulin Syringe-Needle U-100 (INSULIN SYRINGE 1CC/30GX5/16") 30G X 5/16" 1 ML MISC Check blood sugar TID & QHS 10/10/15   Langeland, Dawn T, MD  lisinopril (ZESTRIL) 5 MG tablet Take 1 tablet  (5 mg total) by mouth daily. 04/29/19   Charlott Rakes, MD  metFORMIN (GLUCOPHAGE) 500 MG tablet Take 2 tablets (1,000 mg total) by mouth 2 (two) times daily with a meal. 04/29/19   Charlott Rakes, MD  traMADol (ULTRAM) 50 MG tablet Take 1 tablet (50 mg total) by mouth every 8 (eight) hours as needed for severe pain. 05/29/19   Elgergawy, Silver Huguenin, MD  TRUEPLUS LANCETS 28G MISC 1 each by Does not apply route 3 (three) times daily. 07/07/18   Charlott Rakes, MD    Allergies    Patient has no known allergies.  Review of Systems   Review of Systems  Cardiovascular: Negative for chest pain.  Gastrointestinal: Negative for abdominal pain.  Neurological: Negative for headaches.  All other systems reviewed and are negative.   Physical Exam Updated Vital Signs BP 112/68   Pulse 94   Temp 98.2 F (36.8 C) (Oral)   Resp 16   SpO2 96%   Physical Exam Vitals reviewed.  HENT:     Head: Normocephalic.  Cardiovascular:     Rate and Rhythm: Normal rate.  Pulmonary:     Effort: Pulmonary effort is normal.  Musculoskeletal:        General: Swelling and tenderness present. Normal range of motion.  Skin:    General: Skin is warm.  Neurological:     General: No focal deficit present.     Mental Status: She is alert.  Psychiatric:        Mood and Affect: Mood normal.     ED Results / Procedures / Treatments   Labs (all labs ordered are listed, but only abnormal results are displayed) Labs Reviewed - No data to display  EKG None  Radiology DG Shoulder Right  Result Date: 08/02/2020 CLINICAL DATA:  Right shoulder pain. EXAM: RIGHT SHOULDER - 2+ VIEW COMPARISON:  Chest x-ray 05/18/2019. FINDINGS: Subcapital fracture of the proximal humerus noted. Slight displacement. No dislocation or separation. Acromioclavicular and glenohumeral degenerative change. IMPRESSION: Subcapital fracture of the proximal humerus. Slight displacement. No dislocation or separation. Electronically Signed    By: Marcello Moores  Register   On: 08/02/2020 14:57    Procedures Procedures   Medications Ordered in ED Medications  ibuprofen (ADVIL) tablet 400 mg (400 mg Oral Given 08/02/20 1346)    ED Course  I have reviewed the triage vital signs and the nursing notes.  Pertinent labs & imaging results that were available during my care of the patient were reviewed by me and considered in my medical decision making (see chart for details).    MDM Rules/Calculators/A&P  MDM:  Pt has a subcapital fx of proximal humerus .  Pt placed in a sling. I advised follow up with Dr. Doreatha Martin for evaluation. Pt advised tylenol for pain  Final Clinical Impression(s) / ED Diagnoses Final diagnoses:  Pain  Shoulder fracture, left, closed, initial encounter    Rx / DC Orders ED Discharge Orders    None    An After Visit Summary was printed and given to the patient.    Fransico Meadow, [place] 08/02/20 2326    Tegeler, Gwenyth Allegra, MD 08/03/20 1420

## 2020-08-03 NOTE — ED Notes (Signed)
Sling applied .

## 2020-08-25 ENCOUNTER — Other Ambulatory Visit: Payer: Self-pay

## 2020-08-25 ENCOUNTER — Encounter: Payer: Self-pay | Admitting: Family Medicine

## 2020-08-25 ENCOUNTER — Ambulatory Visit: Payer: Self-pay | Attending: Family Medicine | Admitting: Family Medicine

## 2020-08-25 ENCOUNTER — Other Ambulatory Visit: Payer: Self-pay | Admitting: Family Medicine

## 2020-08-25 VITALS — BP 176/78 | HR 83 | Ht <= 58 in | Wt 94.8 lb

## 2020-08-25 DIAGNOSIS — S42201A Unspecified fracture of upper end of right humerus, initial encounter for closed fracture: Secondary | ICD-10-CM

## 2020-08-25 DIAGNOSIS — E1165 Type 2 diabetes mellitus with hyperglycemia: Secondary | ICD-10-CM

## 2020-08-25 DIAGNOSIS — E2839 Other primary ovarian failure: Secondary | ICD-10-CM

## 2020-08-25 DIAGNOSIS — I10 Essential (primary) hypertension: Secondary | ICD-10-CM

## 2020-08-25 DIAGNOSIS — Z794 Long term (current) use of insulin: Secondary | ICD-10-CM

## 2020-08-25 DIAGNOSIS — Z1159 Encounter for screening for other viral diseases: Secondary | ICD-10-CM

## 2020-08-25 LAB — GLUCOSE, POCT (MANUAL RESULT ENTRY)
POC Glucose: 488 mg/dl — AB (ref 70–99)
POC Glucose: 358 mg/dl — AB (ref 70–99)

## 2020-08-25 LAB — POCT GLYCOSYLATED HEMOGLOBIN (HGB A1C): HbA1c POC (<> result, manual entry): >15 % (ref 4.0–5.6)

## 2020-08-25 MED ORDER — INSULIN GLARGINE 100 UNIT/ML ~~LOC~~ SOLN: 15.0000 [IU] | Freq: Every day | SUBCUTANEOUS | 6 refills | Status: AC

## 2020-08-25 MED ORDER — ATORVASTATIN CALCIUM 20 MG PO TABS: 20.0000 mg | ORAL_TABLET | Freq: Every day | ORAL | 6 refills | Status: AC

## 2020-08-25 MED ORDER — TRAMADOL HCL 50 MG PO TABS: 50.0000 mg | ORAL_TABLET | Freq: Every evening | ORAL | 0 refills | Status: DC | PRN

## 2020-08-25 MED ORDER — LISINOPRIL 5 MG PO TABS: 5.0000 mg | ORAL_TABLET | Freq: Every day | ORAL | 6 refills | Status: AC

## 2020-08-25 MED ORDER — AMLODIPINE BESYLATE 10 MG PO TABS: 10.0000 mg | ORAL_TABLET | Freq: Every day | ORAL | 6 refills | Status: AC

## 2020-08-25 MED ORDER — METFORMIN HCL 500 MG PO TABS: 1000.0000 mg | ORAL_TABLET | Freq: Two times a day (BID) | ORAL | 6 refills | Status: AC

## 2020-08-25 MED ORDER — INSULIN GLARGINE 100 UNIT/ML ~~LOC~~ SOLN
15.0000 [IU] | Freq: Every day | SUBCUTANEOUS | 6 refills | Status: DC
Start: 2020-08-25 — End: 2020-08-25

## 2020-08-25 MED ORDER — INSULIN ASPART 100 UNIT/ML ~~LOC~~ SOLN
25.0000 [IU] | Freq: Once | SUBCUTANEOUS | Status: AC
  Administered 2020-08-25: 25 [IU] via SUBCUTANEOUS

## 2020-08-25 MED ORDER — TRAMADOL HCL 50 MG PO TABS: 50.0000 mg | ORAL_TABLET | Freq: Every evening | ORAL | 0 refills | Status: AC | PRN

## 2020-08-25 MED FILL — LISINOPRIL 5 MG TABLET: 5 | 30 days supply | Qty: 30 | Fill #0

## 2020-08-25 MED FILL — AMLODIPINE BESYLATE 10 MG T: 10 | 30 days supply | Qty: 30 | Fill #0

## 2020-08-25 MED FILL — !LANTUS 100 UNITS/ML VIAL: 100 | 30 days supply | Qty: 10 | Fill #0

## 2020-08-25 MED FILL — traMADol HCL 50 MG TABS: 50 | 30 days supply | Qty: 30 | Fill #0

## 2020-08-25 MED FILL — ATORVASTATIN CALCIUM 20 MG: 20 | 30 days supply | Qty: 30 | Fill #0

## 2020-08-25 MED FILL — METFORMIN HCL 500 MG TABS: 500 | 30 days supply | Qty: 120 | Fill #0

## 2020-08-25 NOTE — Progress Notes (Signed)
Subjective:  Patient ID: Emily Hoover, female    DOB: March 02, 1946  Age: 75 y.o. MRN: [id number]  CC: Diabetes   HPI Emily Hoover  is a 75 year old female with a history of type 2 Diabetes Mellitus (A1c >15), Hypertension who presents today accompanied by her son for a follow-up visit.  Last seen in the clinic in 06/2018.  Accompanied by her daughter to today's visit. She has been out of her diabetic medications and antihypertensive for the last 2 months.  Blood sugar in the clinic is 488.  Blood pressure is elevated.  Denies presence of chest pain, abdominal pain, blurry vision, headaches.  She fell and hurt her R arm 3 weeks ago for which was seen at the ED, diagnosed with right humeral fracture and she  was placed in a sling and advised to follow up with Dr Doreatha Martin which she never did. R Shoulder xray revealed: IMPRESSION: Subcapital fracture of the proximal humerus. Slight displacement. No dislocation or separation.  Currently taking Tylenol which has not been as effective in controlling her right shoulder pain.  Past Medical History:  Diagnosis Date  . Diabetes mellitus   . Diabetes mellitus without complication ([place])   . Hypercholesteremia   . Hypertension   . Renal insufficiency     Past Surgical History:  Procedure Laterality Date  . TUBAL LIGATION      Family History  Family history unknown: Yes  Problem Relation Age of Onset  . Family history unknown: Yes  . CAD Mother   . CAD Father   . Hypertension Father   . Diabetes Neg Hx   . Cancer Neg Hx     No Known Allergies  Outpatient Medications Prior to Visit  Medication Sig Dispense Refill  . acetaminophen (TYLENOL) 325 MG tablet Take 2 tablets (650 mg total) by mouth every 6 (six) hours as needed for mild pain or moderate pain.    . Blood Glucose Monitoring Suppl (TRUE METRIX METER) DEVI 1 each by Does not apply route 3 (three) times daily. 1 Device 0  . glucose blood (TRUE METRIX BLOOD GLUCOSE TEST) test  strip 1 each by Other route 3 (three) times daily. 100 each 12  . Insulin Syringe-Needle U-100 (INSULIN SYRINGE 1CC/30GX5/16") 30G X 5/16" 1 ML MISC Check blood sugar TID & QHS 100 each 3  . TRUEPLUS LANCETS 28G MISC 1 each by Does not apply route 3 (three) times daily. 100 each 11  . amLODipine (NORVASC) 10 MG tablet Take 1 tablet (10 mg total) by mouth daily. 30 tablet 0  . atorvastatin (LIPITOR) 20 MG tablet Take 1 tablet (20 mg total) by mouth daily. 30 tablet 0  . insulin glargine (LANTUS) 100 UNIT/ML injection Inject 0.1 mLs (10 Units total) into the skin at bedtime. 10 mL 0  . lisinopril (ZESTRIL) 5 MG tablet Take 1 tablet (5 mg total) by mouth daily. 30 tablet 0  . metFORMIN (GLUCOPHAGE) 500 MG tablet Take 2 tablets (1,000 mg total) by mouth 2 (two) times daily with a meal. 120 tablet 0  . traMADol (ULTRAM) 50 MG tablet Take 1 tablet (50 mg total) by mouth every 8 (eight) hours as needed for severe pain. 15 tablet 0  . benzonatate (TESSALON) 100 MG capsule Take 1 capsule (100 mg total) by mouth every 8 (eight) hours. (Patient not taking: No sig reported) 15 capsule 0   No facility-administered medications prior to visit.     ROS Review of Systems  Constitutional: Negative  for activity change, appetite change and fatigue.  HENT: Negative for congestion, sinus pressure and sore throat.   Eyes: Negative for visual disturbance.  Respiratory: Negative for cough, chest tightness, shortness of breath and wheezing.   Cardiovascular: Negative for chest pain and palpitations.  Gastrointestinal: Negative for abdominal distention, abdominal pain and constipation.  Endocrine: Negative for polydipsia.  Genitourinary: Negative for dysuria and frequency.  Musculoskeletal:       See HPI  Skin: Negative for rash.  Neurological: Negative for tremors, light-headedness and numbness.  Hematological: Does not bruise/bleed easily.  Psychiatric/Behavioral: Negative for agitation and behavioral problems.     Objective:  BP (!) 176/78   Pulse 83   Ht _0  (1.448 m)   Wt 94 lb 12.8 oz (43 kg)   SpO2 98%   BMI 20.51 kg/m   BP/Weight 08/25/2020 08/02/2020 56/38/9373  Systolic BP 428 768 115  Diastolic BP 78 88 77  Wt. (Lbs) 94.8 - -  BMI 20.51 - -      Physical Exam Constitutional:      Appearance: She is well-developed.  Neck:     Vascular: No JVD.  Cardiovascular:     Rate and Rhythm: Normal rate.     Heart sounds: Normal heart sounds. No murmur heard.   Pulmonary:     Effort: Pulmonary effort is normal.     Breath sounds: Normal breath sounds. No wheezing or rales.  Chest:     Chest wall: No tenderness.  Abdominal:     General: Bowel sounds are normal. There is no distension.     Palpations: Abdomen is soft. There is no mass.     Tenderness: There is no abdominal tenderness.  Musculoskeletal:     Right lower leg: No edema.     Left lower leg: No edema.     Comments: Right upper extremity in a sling.   Neurological:     Mental Status: She is alert and oriented to person, place, and time.  Psychiatric:        Mood and Affect: Mood normal.     CMP Latest Ref Rng & Units 05/26/2019 05/25/2019 05/23/2019  Glucose 70 - 99 mg/dL 120(H) 68(L) 62(L)  BUN 8 - 23 mg/dL _1 Creatinine 0.44 - 1.00 mg/dL 0.50 0.69 0.54  Sodium 135 - 145 mmol/L 134(L) 136 132(L)  Potassium 3.5 - 5.1 mmol/L 3.9 3.7 3.8  Chloride 98 - 111 mmol/L 101 102 96(L)  CO2 22 - 32 mmol/L _2 Calcium 8.9 - 10.3 mg/dL 8.3(L) 8.5(L) 8.6(L)  Total Protein 6.5 - 8.1 g/dL - 6.6 6.3(L)  Total Bilirubin 0.3 - 1.2 mg/dL - 0.8 0.9  Alkaline Phos 38 - 126 U/L - 109 81  AST 15 - 41 U/L - 53(H) 17  ALT 0 - 44 U/L - 33 11    Lipid Panel     Component Value Date/Time   CHOL 243 (H) 08/02/2017 0828   TRIG 155 (H) 08/02/2017 0828   HDL 48 08/02/2017 0828   CHOLHDL 5.1 (H) 08/02/2017 0828   LDLCALC 164 (H) 08/02/2017 0828    CBC    Component Value Date/Time   WBC 5.3 05/25/2019 0435    RBC 3.77 (L) 05/25/2019 0435   HGB 11.2 (L) 05/25/2019 0435   HCT 33.8 (L) 05/25/2019 0435   PLT 444 (H) 05/25/2019 0435   MCV 89.7 05/25/2019 0435   MCV 89.8 07/01/2014 1257   MCH 29.7 05/25/2019 0435  MCHC 33.1 05/25/2019 0435   RDW 13.5 05/25/2019 0435   LYMPHSABS 1.0 05/19/2019 0816   MONOABS 0.4 05/19/2019 0816   EOSABS 0.0 05/19/2019 0816   BASOSABS 0.1 05/19/2019 0816    Lab Results  Component Value Date   HGBA1C >15 08/25/2020    Assessment & Plan:  1. Type 2 diabetes mellitus with hyperglycemia, with long-term current use of insulin (HCC) Uncontrolled with A1c of greater than 15, CBG of 488 She is at high risk of progression to complications of diabetes including DKA but is stable at this time Oral hydration provided in addition to 25 units of NovoLog and patient observed for 45 minutes, CBG repeated was 358 Medication refilled Counseled on Diabetic diet, my plate method, 893 minutes of moderate intensity exercise/week Blood sugar logs with fasting goals of 80-120 mg/dl, random of less than 180 and in the event of sugars less than 60 mg/dl or greater than 400 mg/dl encouraged to notify the clinic. Advised on the need for annual eye exams, annual foot exams, Pneumonia vaccine. - POCT glucose (manual entry) - POCT glycosylated hemoglobin (Hb A1C) - atorvastatin (LIPITOR) 20 MG tablet; Take 1 tablet (20 mg total) by mouth daily.  Dispense: 30 tablet; Refill: 6 - metFORMIN (GLUCOPHAGE) 500 MG tablet; Take 2 tablets (1,000 mg total) by mouth 2 (two) times daily with a meal.  Dispense: 120 tablet; Refill: 6 - CMP14+EGFR - insulin aspart (novoLOG) injection 25 Units - POCT glucose (manual entry) - insulin glargine (LANTUS) 100 UNIT/ML injection; Inject 0.15 mLs (15 Units total) into the skin at bedtime.  Dispense: 30 mL; Refill: 6  2. Closed fracture of proximal end of right humerus, unspecified fracture morphology, initial encounter - AMB referral to orthopedics - traMADol  (ULTRAM) 50 MG tablet; Take 1 tablet (50 mg total) by mouth at bedtime as needed for severe pain.  Dispense: 30 tablet; Refill: 0  3. Essential hypertension Uncontrolled due to running out of medications which I have refilled Counseled on blood pressure goal of less than 130/80, low-sodium, DASH diet, medication compliance, 150 minutes of moderate intensity exercise per week. Discussed medication compliance, adverse effects. - amLODipine (NORVASC) 10 MG tablet; Take 1 tablet (10 mg total) by mouth daily.  Dispense: 30 tablet; Refill: 6 - lisinopril (ZESTRIL) 5 MG tablet; Take 1 tablet (5 mg total) by mouth daily.  Dispense: 30 tablet; Refill: 6  4. Need for hepatitis C screening test - HCV RNA quant rflx ultra or genotyp([facility])  5. Estrogen deficiency - DG Bone Density; Future   Meds ordered this encounter  Medications  . amLODipine (NORVASC) 10 MG tablet    Sig: Take 1 tablet (10 mg total) by mouth daily.    Dispense:  30 tablet    Refill:  6  . atorvastatin (LIPITOR) 20 MG tablet    Sig: Take 1 tablet (20 mg total) by mouth daily.    Dispense:  30 tablet    Refill:  6  . DISCONTD: insulin glargine (LANTUS) 100 UNIT/ML injection    Sig: Inject 0.15 mLs (15 Units total) into the skin at bedtime.    Dispense:  30 mL    Refill:  6  . lisinopril (ZESTRIL) 5 MG tablet    Sig: Take 1 tablet (5 mg total) by mouth daily.    Dispense:  30 tablet    Refill:  6  . metFORMIN (GLUCOPHAGE) 500 MG tablet    Sig: Take 2 tablets (1,000 mg total) by mouth 2 (two) times  daily with a meal.    Dispense:  120 tablet    Refill:  6  . DISCONTD: traMADol (ULTRAM) 50 MG tablet    Sig: Take 1 tablet (50 mg total) by mouth at bedtime as needed for severe pain.    Dispense:  30 tablet    Refill:  0  . insulin aspart (novoLOG) injection 25 Units  . insulin glargine (LANTUS) 100 UNIT/ML injection    Sig: Inject 0.15 mLs (15 Units total) into the skin at bedtime.    Dispense:  30 mL     Refill:  6  . DISCONTD: traMADol (ULTRAM) 50 MG tablet    Sig: Take 1 tablet (50 mg total) by mouth at bedtime as needed for severe pain.    Dispense:  30 tablet    Refill:  0  . traMADol (ULTRAM) 50 MG tablet    Sig: Take 1 tablet (50 mg total) by mouth at bedtime as needed for severe pain.    Dispense:  30 tablet    Refill:  0    Follow-up: Return in about 3 months (around 11/22/2020) for Chronic disease management.       Charlott Rakes, MD, FAAFP. [facility] and [place], [place]   08/25/2020, 12:01 PM

## 2020-08-25 NOTE — Patient Instructions (Signed)
Diabetes mellitus y nutricin, en adultos Diabetes Mellitus and Nutrition, Adult Si sufre de diabetes, o diabetes mellitus, es muy importante tener hbitos alimenticios saludables debido a que sus niveles de Designer, television/film set sangre (glucosa) se ven afectados en gran medida por lo que come y bebe. [place] alimentos saludables en las cantidades correctas, aproximadamente a la misma hora todos los [place], [place] ayudar a:  Aeronautical engineer glucemia.  Disminuir el riesgo de sufrir una enfermedad cardaca.  Mejorar la presin arterial.  Science writer o mantener un peso saludable. Qu puede afectar mi plan de alimentacin? Todas las personas que sufren de diabetes son diferentes y cada una tiene necesidades diferentes en cuanto a un plan de alimentacin. El mdico puede recomendarle que trabaje con un nutricionista para elaborar el mejor plan para usted. Su plan de alimentacin puede variar segn factores como:  Las caloras que necesita.  Los medicamentos que toma.  Su peso.  Sus niveles de glucemia, presin arterial y colesterol.  Su nivel de [place].  Otras afecciones que tenga, como enfermedades cardacas o renales. Cmo me afectan los carbohidratos? Los carbohidratos, o hidratos de carbono, afectan su nivel de glucemia ms que cualquier otro tipo de alimento. La ingesta de carbohidratos naturalmente aumenta la cantidad de [employer]. El recuento de carbohidratos es un mtodo destinado a Catering manager un registro de la cantidad de carbohidratos que se consumen. El recuento de carbohidratos es importante para Theatre manager la glucemia a un nivel saludable, especialmente si utiliza insulina o toma determinados medicamentos por va oral para la diabetes. Es importante conocer la cantidad de carbohidratos que se pueden ingerir en cada comida sin correr Engineer, manufacturing. Esto es Psychologist, forensic. Su nutricionista puede ayudarlo a calcular la cantidad de carbohidratos que debe ingerir en cada comida y en cada  refrigerio. Cmo me afecta el alcohol? El alcohol puede provocar disminuciones sbitas de la glucemia (hipoglucemia), especialmente si utiliza insulina o toma determinados medicamentos por va oral para la diabetes. La hipoglucemia es una afeccin potencialmente mortal. Los sntomas de la hipoglucemia, como somnolencia, mareos y confusin, son similares a los sntomas de haber consumido demasiado alcohol.  No beba alcohol si: ? Su mdico le indica no hacerlo. ? Est embarazada, puede estar embarazada o est tratando de quedar embarazada.  Si bebe alcohol: ? No beba con el estmago vaco. ? Limite la cantidad que bebe:  De 0 a 1 medida por da para las mujeres.  De 0 a 2 medidas por da para los hombres. ? Est atento a la cantidad de alcohol que hay en las bebidas que toma. En los [place], una medida equivale a una botella de cerveza de 12oz (368m), un vaso de vino de 5oz (1468m o un vaso de una bebida alcohlica de alta graduacin de 1oz (4485m ? Mantngase hidratado bebiendo agua, refrescos dietticos o t helado sin azcar.  Tenga en cuenta que los refrescos comunes, los jugos y otras bebida para mezOptician, dispensingeden contener mucha azcar y se deben contar como carbohidratos. Consejos para seguir estPhotographers etiquetas de los alimentos  Comience por leer el tamao de la porcin en la "Informacin nutricional" en las etiquetas de los alimentos envasados y las bebidas. La cantidad de caloras, carbohidratos, grasas y otros nutrientes mencionados en la etiqueta se basan en una porcin del alimento. Muchos alimentos contienen ms de una porcin por envase.  Verifique la cantidad total de gramos (g) de carbohidratos totales en una porcin. Puede calcular la cantidad de porciones  de carbohidratos al dividir el total de carbohidratos por 15. Por ejemplo, si un alimento tiene un total de 30g de carbohidratos totales por porcin, equivale a 2 porciones de  carbohidratos.  Verifique la cantidad de gramos (g) de grasas saturadas y grasas trans de una porcin. Escoja alimentos que no contengan estas grasas o que su contenido de estas sea [place].  Verifique la cantidad de miligramos (mg) de sal (sodio) en una porcin. [place] [employer] de las personas deben limitar la ingesta de sodio total a menos de 2333m por dTraining and development officer  Siempre consulte la informacin nutricional de los alimentos etiquetados como "con bajo contenido de grasa" o "sin grasa". Estos alimentos pueden tener un mayor contenido de aLocation manageragregada o carbohidratos refinados, y deben evitarse.  Hable con su nutricionista para identificar sus objetivos diarios en cuanto a los nutrientes mencionados en la etiqueta. Al ir de compras  Evite comprar alimentos procesados, enlatados o precocidos. Estos alimentos tienden a tSpecial educational needs teachermayor cantidad de gF[place] sodio y azcar agregada.  Compre en la zona exterior de la tienda de comestibles. Esta es la zona donde se encuentran con mayor frecuencia las frutas y las verduras frescas, los cereales a granel, las carnes frescas y los productos lcteos frescos. Al cocinar  Utilice mtodos de coccin a baja temperatura, como hornear, en lugar de mtodos de coccin a alta temperatura, como frer en abundante aceite.  Cocine con aceites saludables, como el aceite de oFrontier canola o gPiketon  Evite cocinar con manteca, crema o carnes con alto contenido de grasa. Planificacin de las comidas  Coma las comidas y los refrigerios regularmente, preferentemente a la misma hora todos lK[place] Evite pasar largos perodos de tiempo sin comer.  Consuma alimentos ricos en fibra, como frutas frescas, verduras, frijoles y cereales integrales. Consulte a su nutricionista sobre cuntas porciones de carbohidratos puede consumir en cada comida.  Consuma entre 4 y 6 onzas (entre 112 y 168g) de protenas magras por da, como carnes magras, pollo, pescado, huevos o tofu. Una onza (oz) de  protena magra equivale a: ? 1 onza (28g) de carne, pollo o pescado. ? 1huevo. ?  de taza (62 g) de tofu.  Coma algunos alimentos por da que contengan grasas saludables, como aguacates, frutos secos, semillas y pescado.   Qu alimentos debo comer? FLambert ModyBayas. Manzanas. Naranjas. Duraznos. Damascos. Ciruelas. Uvas. Mango. Papaya. GTolna Kiwi. Cerezas. VHolland CommonsLValeda Malm Espinaca. Verduras de hBoeing que incluyen col rizada, aFolsom hojas de bIraqy de mFountain Remolachas. Coliflor. Repollo. Brcoli. Zanahorias. Judas verdes. Tomates. Pimientos. Cebollas. Pepinos. Coles de Bruselas. Granos Granos integrales, como panes, galletas, tortillas, cereales y pastas de salvado o integrales. Avena sin azcar. Quinua. Arroz integral o salvaje. Carnes y oPsychiatric nurse Carne de ave sin piel. Cortes magros de ave y carne de res. Tofu. Frutos secos. Semillas. Lcteos Productos lcteos sin grasa o con bajo contenido de gPepin cUpper Stewartsville yogur y qKahaluu-Keauhou Es posible que los productos que se enumeran ms aNew Caledoniano constituyan una lista completa de los alimentos y las bebidas que puede tomar. Consulte a un nutricionista para obtener ms informacin. Qu alimentos debo evitar? FLambert ModyFrutas enlatadas al almbar. Verduras Verduras enlatadas. Verduras congeladas con mantequilla o salsa de crema. Granos Productos elaborados con hIsraely hLao People's Democratic Republic como panes, pastas, bocadillos y cereales. Evite todos los alimentos procesados. Carnes y otras protenas Cortes de carne con alto contenido de gLobbyist Carne de ave con piel. Carnes empanizadas o fritas. Carne procesada. Evite las grasas saturadas.  los alimentos y las bebidas que debe evitar. Consulte a un nutricionista para obtener ms informacin. Preguntas para hacerle al mdico Es necesario que me rena con un instructor en el cuidado  de la diabetes? Es necesario que me rena con un nutricionista? A qu nmero puedo llamar si tengo preguntas? Cules son los mejores momentos para controlar la glucemia? Dnde encontrar ms informacin: Asociacin Estadounidense de la Diabetes (American Diabetes Association): diabetes.org Academy of Nutrition and Dietetics (Academia de Nutricin y Diettica): www.eatright.org National Institute of Diabetes and Digestive and Kidney Diseases (Instituto Nacional de la Diabetes y las Enfermedades Digestivas y Renales): www.niddk.nih.gov Association of Diabetes Care and Education Specialists (Asociacin de Especialistas en Atencin y Educacin sobre la Diabetes): www.diabeteseducator.org Resumen Es importante tener hbitos alimenticios saludables debido a que sus niveles de azcar en la sangre (glucosa) se ven afectados en gran medida por lo que come y bebe. Un plan de alimentacin saludable lo ayudar a controlar la glucemia y mantener un estilo de vida saludable. El mdico puede recomendarle que trabaje con un nutricionista para elaborar el mejor plan para usted. Tenga en cuenta que los carbohidratos (hidratos de carbono) y el alcohol tienen efectos inmediatos en sus niveles de glucemia. Es importante contar los carbohidratos que ingiere y consumir alcohol con prudencia. Esta informacin no tiene como fin reemplazar el consejo del mdico. Asegrese de hacerle al mdico cualquier pregunta que tenga. Document Revised: 07/30/2019 Document Reviewed: 07/30/2019 Elsevier Patient Education  2021 Elsevier Inc.  

## 2020-08-25 NOTE — Progress Notes (Signed)
Had fall and hurt right arm.

## 2020-08-26 LAB — CMP14+EGFR
ALT: 11 IU/L (ref 0–32)
AST: 18 IU/L (ref 0–40)
Albumin/Globulin Ratio: 1 — ABNORMAL LOW (ref 1.2–2.2)
Albumin: 3.4 g/dL — ABNORMAL LOW (ref 3.7–4.7)
Alkaline Phosphatase: 159 IU/L — ABNORMAL HIGH (ref 44–121)
BUN/Creatinine Ratio: 18 (ref 12–28)
BUN: 11 mg/dL (ref 8–27)
Bilirubin Total: 0.4 mg/dL (ref 0.0–1.2)
CO2: 23 mmol/L (ref 20–29)
Calcium: 9.6 mg/dL (ref 8.7–10.3)
Chloride: 93 mmol/L — ABNORMAL LOW (ref 96–106)
Creatinine, Ser: 0.6 mg/dL (ref 0.57–1.00)
GFR calc Af Amer: 102 mL/min/{1.73_m2} (ref >59)
GFR calc non Af Amer: 89 mL/min/{1.73_m2} (ref >59)
Globulin, Total: 3.3 g/dL (ref 1.5–4.5)
Glucose: 300 mg/dL — ABNORMAL HIGH (ref 65–99)
Potassium: 3.9 mmol/L (ref 3.5–5.2)
Sodium: 131 mmol/L — ABNORMAL LOW (ref 134–144)
Total Protein: 6.7 g/dL (ref 6.0–8.5)

## 2020-08-26 LAB — HCV RNA QUANT RFLX ULTRA OR GENOTYP: HCV Quant Baseline: NOT DETECTED IU/mL

## 2020-11-22 ENCOUNTER — Ambulatory Visit: Payer: Self-pay | Admitting: Family Medicine

## 2020-11-29 ENCOUNTER — Ambulatory Visit: Payer: Self-pay | Admitting: Family Medicine
